# Patient Record
Sex: Female | Born: 1935 | Race: White | Hispanic: No | Smoking: Never smoker
Health system: Southern US, Community
[De-identification: ages and names within clinical notes are randomized; demographics above are authoritative.]

## PROBLEM LIST (undated history)

## (undated) DIAGNOSIS — I2510 Atherosclerotic heart disease of native coronary artery without angina pectoris: Secondary | ICD-10-CM

## (undated) DIAGNOSIS — N289 Disorder of kidney and ureter, unspecified: Secondary | ICD-10-CM

## (undated) DIAGNOSIS — M549 Dorsalgia, unspecified: Secondary | ICD-10-CM

## (undated) DIAGNOSIS — A0100 Typhoid fever, unspecified: Secondary | ICD-10-CM

## (undated) DIAGNOSIS — H3530 Unspecified macular degeneration: Secondary | ICD-10-CM

## (undated) DIAGNOSIS — J449 Chronic obstructive pulmonary disease, unspecified: Secondary | ICD-10-CM

## (undated) DIAGNOSIS — E7800 Pure hypercholesterolemia, unspecified: Secondary | ICD-10-CM

## (undated) DIAGNOSIS — I10 Essential (primary) hypertension: Secondary | ICD-10-CM

## (undated) DIAGNOSIS — M8580 Other specified disorders of bone density and structure, unspecified site: Secondary | ICD-10-CM

## (undated) DIAGNOSIS — J189 Pneumonia, unspecified organism: Secondary | ICD-10-CM

## (undated) DIAGNOSIS — G4730 Sleep apnea, unspecified: Secondary | ICD-10-CM

## (undated) DIAGNOSIS — M1990 Unspecified osteoarthritis, unspecified site: Secondary | ICD-10-CM

## (undated) DIAGNOSIS — F0390 Unspecified dementia without behavioral disturbance: Secondary | ICD-10-CM

## (undated) DIAGNOSIS — K279 Peptic ulcer, site unspecified, unspecified as acute or chronic, without hemorrhage or perforation: Secondary | ICD-10-CM

## (undated) DIAGNOSIS — E079 Disorder of thyroid, unspecified: Secondary | ICD-10-CM

## (undated) HISTORY — PX: BACK SURGERY: SHX140

## (undated) HISTORY — PX: ABDOMINAL HYSTERECTOMY: SHX81

## (undated) HISTORY — PX: CATARACT EXTRACTION: SUR2

## (undated) HISTORY — PX: CHOLECYSTECTOMY: SHX55

## (undated) HISTORY — PX: THYROID SURGERY: SHX805

---

## 1998-03-26 ENCOUNTER — Ambulatory Visit (HOSPITAL_BASED_OUTPATIENT_CLINIC_OR_DEPARTMENT_OTHER): Admission: RE | Admit: 1998-03-26 | Discharge: 1998-03-26 | Payer: Self-pay | Admitting: *Deleted

## 2000-05-03 ENCOUNTER — Encounter: Payer: Self-pay | Admitting: Internal Medicine

## 2000-05-03 ENCOUNTER — Encounter: Admission: RE | Admit: 2000-05-03 | Discharge: 2000-05-03 | Payer: Self-pay | Admitting: Internal Medicine

## 2000-06-08 ENCOUNTER — Encounter: Payer: Self-pay | Admitting: Internal Medicine

## 2000-06-08 ENCOUNTER — Encounter: Admission: RE | Admit: 2000-06-08 | Discharge: 2000-06-08 | Payer: Self-pay | Admitting: Internal Medicine

## 2000-06-20 ENCOUNTER — Ambulatory Visit (HOSPITAL_COMMUNITY): Admission: RE | Admit: 2000-06-20 | Discharge: 2000-06-20 | Payer: Self-pay | Admitting: Gastroenterology

## 2000-07-01 ENCOUNTER — Encounter: Admission: RE | Admit: 2000-07-01 | Discharge: 2000-09-29 | Payer: Self-pay | Admitting: Anesthesiology

## 2001-04-15 ENCOUNTER — Emergency Department (HOSPITAL_COMMUNITY): Admission: EM | Admit: 2001-04-15 | Discharge: 2001-04-15 | Payer: Self-pay | Admitting: Emergency Medicine

## 2001-05-10 ENCOUNTER — Encounter: Payer: Self-pay | Admitting: Internal Medicine

## 2001-05-10 ENCOUNTER — Encounter: Admission: RE | Admit: 2001-05-10 | Discharge: 2001-05-10 | Payer: Self-pay | Admitting: Internal Medicine

## 2002-07-24 ENCOUNTER — Encounter: Payer: Self-pay | Admitting: Internal Medicine

## 2002-07-24 ENCOUNTER — Encounter: Admission: RE | Admit: 2002-07-24 | Discharge: 2002-07-24 | Payer: Self-pay | Admitting: Internal Medicine

## 2004-11-18 ENCOUNTER — Other Ambulatory Visit: Admission: RE | Admit: 2004-11-18 | Discharge: 2004-11-18 | Payer: Self-pay | Admitting: Family Medicine

## 2005-08-03 ENCOUNTER — Encounter: Admission: RE | Admit: 2005-08-03 | Discharge: 2005-08-03 | Payer: Self-pay | Admitting: Family Medicine

## 2005-09-28 ENCOUNTER — Encounter: Admission: RE | Admit: 2005-09-28 | Discharge: 2005-09-28 | Payer: Self-pay | Admitting: Family Medicine

## 2006-05-16 ENCOUNTER — Encounter: Admission: RE | Admit: 2006-05-16 | Discharge: 2006-05-16 | Payer: Self-pay | Admitting: *Deleted

## 2006-08-01 ENCOUNTER — Emergency Department (HOSPITAL_COMMUNITY): Admission: EM | Admit: 2006-08-01 | Discharge: 2006-08-01 | Payer: Self-pay | Admitting: Emergency Medicine

## 2007-02-27 ENCOUNTER — Ambulatory Visit: Payer: Self-pay | Admitting: Vascular Surgery

## 2007-03-03 ENCOUNTER — Encounter: Admission: RE | Admit: 2007-03-03 | Discharge: 2007-03-03 | Payer: Self-pay | Admitting: Family Medicine

## 2007-03-29 ENCOUNTER — Encounter (INDEPENDENT_AMBULATORY_CARE_PROVIDER_SITE_OTHER): Payer: Self-pay | Admitting: Interventional Radiology

## 2007-03-29 ENCOUNTER — Encounter: Admission: RE | Admit: 2007-03-29 | Discharge: 2007-03-29 | Payer: Self-pay | Admitting: Surgery

## 2007-03-29 ENCOUNTER — Other Ambulatory Visit: Admission: RE | Admit: 2007-03-29 | Discharge: 2007-03-29 | Payer: Self-pay | Admitting: Interventional Radiology

## 2007-04-25 ENCOUNTER — Encounter: Admission: RE | Admit: 2007-04-25 | Discharge: 2007-04-25 | Payer: Self-pay | Admitting: Family Medicine

## 2007-05-09 ENCOUNTER — Encounter: Admission: RE | Admit: 2007-05-09 | Discharge: 2007-05-09 | Payer: Self-pay | Admitting: Family Medicine

## 2007-05-23 ENCOUNTER — Ambulatory Visit (HOSPITAL_COMMUNITY): Admission: RE | Admit: 2007-05-23 | Discharge: 2007-05-23 | Payer: Self-pay | Admitting: Surgery

## 2007-06-28 ENCOUNTER — Ambulatory Visit (HOSPITAL_COMMUNITY): Admission: RE | Admit: 2007-06-28 | Discharge: 2007-06-29 | Payer: Self-pay | Admitting: Surgery

## 2007-06-28 ENCOUNTER — Encounter (INDEPENDENT_AMBULATORY_CARE_PROVIDER_SITE_OTHER): Payer: Self-pay | Admitting: Surgery

## 2007-09-13 ENCOUNTER — Encounter: Admission: RE | Admit: 2007-09-13 | Discharge: 2007-09-13 | Payer: Self-pay | Admitting: Family Medicine

## 2007-10-10 ENCOUNTER — Ambulatory Visit: Payer: Self-pay | Admitting: Vascular Surgery

## 2008-04-13 ENCOUNTER — Inpatient Hospital Stay (HOSPITAL_COMMUNITY): Admission: EM | Admit: 2008-04-13 | Discharge: 2008-04-16 | Payer: Self-pay | Admitting: Emergency Medicine

## 2008-08-26 ENCOUNTER — Inpatient Hospital Stay (HOSPITAL_COMMUNITY): Admission: RE | Admit: 2008-08-26 | Discharge: 2008-09-04 | Payer: Self-pay | Admitting: Neurosurgery

## 2008-08-28 ENCOUNTER — Ambulatory Visit: Payer: Self-pay | Admitting: Internal Medicine

## 2008-09-02 ENCOUNTER — Ambulatory Visit: Payer: Self-pay | Admitting: Physical Medicine & Rehabilitation

## 2008-10-07 ENCOUNTER — Observation Stay (HOSPITAL_COMMUNITY): Admission: EM | Admit: 2008-10-07 | Discharge: 2008-10-08 | Payer: Self-pay | Admitting: Emergency Medicine

## 2008-11-28 ENCOUNTER — Ambulatory Visit: Payer: Self-pay | Admitting: Vascular Surgery

## 2009-04-01 ENCOUNTER — Encounter: Admission: RE | Admit: 2009-04-01 | Discharge: 2009-04-01 | Payer: Self-pay | Admitting: Neurosurgery

## 2009-05-15 ENCOUNTER — Inpatient Hospital Stay (HOSPITAL_COMMUNITY)
Admission: EM | Admit: 2009-05-15 | Discharge: 2009-05-17 | Payer: Self-pay | Source: Home / Self Care | Admitting: Emergency Medicine

## 2009-07-28 ENCOUNTER — Encounter: Admission: RE | Admit: 2009-07-28 | Discharge: 2009-07-28 | Payer: Self-pay | Admitting: Family Medicine

## 2009-08-06 ENCOUNTER — Encounter: Admission: RE | Admit: 2009-08-06 | Discharge: 2009-09-15 | Payer: Self-pay | Admitting: Family Medicine

## 2009-09-25 ENCOUNTER — Emergency Department (HOSPITAL_COMMUNITY): Admission: EM | Admit: 2009-09-25 | Discharge: 2009-09-25 | Payer: Self-pay | Admitting: Emergency Medicine

## 2010-01-28 ENCOUNTER — Ambulatory Visit: Payer: Self-pay | Admitting: Vascular Surgery

## 2010-02-27 ENCOUNTER — Ambulatory Visit: Admit: 2010-02-27 | Payer: Self-pay | Admitting: Vascular Surgery

## 2010-02-28 ENCOUNTER — Encounter: Payer: Self-pay | Admitting: Family Medicine

## 2010-03-01 ENCOUNTER — Encounter: Payer: Self-pay | Admitting: Family Medicine

## 2010-03-11 ENCOUNTER — Other Ambulatory Visit: Payer: Self-pay | Admitting: Family Medicine

## 2010-03-13 ENCOUNTER — Other Ambulatory Visit (INDEPENDENT_AMBULATORY_CARE_PROVIDER_SITE_OTHER): Payer: MEDICARE

## 2010-03-13 ENCOUNTER — Other Ambulatory Visit: Payer: Self-pay | Admitting: Family Medicine

## 2010-03-13 DIAGNOSIS — I6529 Occlusion and stenosis of unspecified carotid artery: Secondary | ICD-10-CM

## 2010-03-20 ENCOUNTER — Other Ambulatory Visit: Payer: Self-pay | Admitting: Family Medicine

## 2010-03-20 ENCOUNTER — Ambulatory Visit
Admission: RE | Admit: 2010-03-20 | Discharge: 2010-03-20 | Disposition: A | Discharge status: Home / Self Care | Payer: MEDICARE | Source: Ambulatory Visit | Attending: Family Medicine | Admitting: Family Medicine

## 2010-03-20 NOTE — Procedures (Unsigned)
CAROTID DUPLEX EXAM  INDICATION:  Carotid disease.  HISTORY: Diabetes:  Yes. Cardiac:  No. Hypertension:  Yes. Smoking:  No. Previous Surgery:  No. CV History:  Complaint of occasional dizziness. Amaurosis Fugax No, Paresthesias No, Hemiparesis No                                      RIGHT              LEFT Brachial systolic pressure:         160                168 Brachial Doppler waveforms:         Normal             Normal Vertebral direction of flow:        Antegrade/dampened Antegrade DUPLEX VELOCITIES (cm/sec) CCA peak systolic                   89                 109 ECA peak systolic                   81                 119 ICA peak systolic                   140                102 ICA end diastolic                   21                 18 PLAQUE MORPHOLOGY:                  Mixed              Mixed PLAQUE AMOUNT:                      Mild / moderate    Mild PLAQUE LOCATION:                    ICA / ECA / CCA    ICA / ECA / CCA  IMPRESSION: 1. Doppler velocities suggest high end 1% to 39% stenosis of the right     proximal internal carotid artery. 2. No hemodynamically significant stenosis of the left internal     carotid artery with plaque formations as described above. 3. No significant change in the bilateral internal carotid artery     velocities when compared to previous exam on 03/31/2008.  ___________________________________________ [name]. Edilia Bo, M.D.  CH/MEDQ  D:  03/16/2010  T:  03/16/2010  Job:  [id number]

## 2010-04-29 LAB — URINE CULTURE: Colony Count: 15000

## 2010-04-29 LAB — DIFFERENTIAL
Eosinophils Relative: 2 % (ref 0–5)
Lymphocytes Relative: 29 % (ref 12–46)
Lymphs Abs: 3.1 10*3/uL (ref 0.7–4.0)
Monocytes Absolute: 1.2 10*3/uL — ABNORMAL HIGH (ref 0.1–1.0)
Monocytes Relative: 11 % (ref 3–12)

## 2010-04-29 LAB — COMPREHENSIVE METABOLIC PANEL
AST: 20 U/L (ref 0–37)
Albumin: 3.6 g/dL (ref 3.5–5.2)
Calcium: 8.6 mg/dL (ref 8.4–10.5)
Chloride: 105 mEq/L (ref 96–112)
Creatinine, Ser: 0.99 mg/dL (ref 0.4–1.2)
GFR calc Af Amer: >60 mL/min (ref >60)
Total Bilirubin: 0.4 mg/dL (ref 0.3–1.2)

## 2010-04-29 LAB — CBC
HCT: 36.8 % (ref 36.0–46.0)
Hemoglobin: 11.9 g/dL — ABNORMAL LOW (ref 12.0–15.0)
MCHC: 32.4 g/dL (ref 30.0–36.0)
MCHC: 33.2 g/dL (ref 30.0–36.0)
MCV: 90.4 fL (ref 78.0–100.0)
Platelets: 186 10*3/uL (ref 150–400)
Platelets: 202 10*3/uL (ref 150–400)
RBC: 3.74 MIL/uL — ABNORMAL LOW (ref 3.87–5.11)
RBC: 4.07 MIL/uL (ref 3.87–5.11)
RDW: 15.5 % (ref 11.5–15.5)
WBC: 10.8 10*3/uL — ABNORMAL HIGH (ref 4.0–10.5)

## 2010-04-29 LAB — CARDIAC PANEL(CRET KIN+CKTOT+MB+TROPI)
CK, MB: 2.2 ng/mL (ref 0.3–4.0)
Total CK: 63 U/L (ref 7–177)
Troponin I: 0.01 ng/mL (ref 0.00–0.06)

## 2010-04-29 LAB — URINALYSIS, MICROSCOPIC ONLY
Bilirubin Urine: NEGATIVE
Glucose, UA: NEGATIVE mg/dL
Protein, ur: NEGATIVE mg/dL
Specific Gravity, Urine: 1.007 (ref 1.005–1.030)
Urobilinogen, UA: 0.2 mg/dL (ref 0.0–1.0)

## 2010-04-29 LAB — GLUCOSE, CAPILLARY
Glucose-Capillary: 125 mg/dL — ABNORMAL HIGH (ref 70–99)
Glucose-Capillary: 130 mg/dL — ABNORMAL HIGH (ref 70–99)
Glucose-Capillary: 149 mg/dL — ABNORMAL HIGH (ref 70–99)

## 2010-04-29 LAB — BASIC METABOLIC PANEL
CO2: 29 mEq/L (ref 19–32)
Calcium: 8.9 mg/dL (ref 8.4–10.5)
Creatinine, Ser: 0.8 mg/dL (ref 0.4–1.2)
GFR calc Af Amer: >60 mL/min (ref >60)
Glucose, Bld: 105 mg/dL — ABNORMAL HIGH (ref 70–99)

## 2010-04-29 LAB — CK TOTAL AND CKMB (NOT AT ARMC)
CK, MB: 2.4 ng/mL (ref 0.3–4.0)
Relative Index: INVALID (ref 0.0–2.5)
Total CK: 57 U/L (ref 7–177)

## 2010-05-16 LAB — CBC
HCT: 33.6 % — ABNORMAL LOW (ref 36.0–46.0)
Hemoglobin: 11.1 g/dL — ABNORMAL LOW (ref 12.0–15.0)
MCHC: 33.1 g/dL (ref 30.0–36.0)
MCHC: 33.7 g/dL (ref 30.0–36.0)
MCV: 87.8 fL (ref 78.0–100.0)
Platelets: 166 10*3/uL (ref 150–400)
Platelets: 167 10*3/uL (ref 150–400)
RBC: 3.72 MIL/uL — ABNORMAL LOW (ref 3.87–5.11)
RDW: 15.1 % (ref 11.5–15.5)

## 2010-05-16 LAB — BASIC METABOLIC PANEL
BUN: 13 mg/dL (ref 6–23)
BUN: 20 mg/dL (ref 6–23)
CO2: 22 mEq/L (ref 19–32)
Calcium: 9.1 mg/dL (ref 8.4–10.5)
Chloride: 103 mEq/L (ref 96–112)
Creatinine, Ser: 0.94 mg/dL (ref 0.4–1.2)
GFR calc non Af Amer: >60 mL/min (ref >60)
Glucose, Bld: 128 mg/dL — ABNORMAL HIGH (ref 70–99)
Potassium: 3.5 mEq/L (ref 3.5–5.1)
Sodium: 135 mEq/L (ref 135–145)

## 2010-05-16 LAB — INFLUENZA A+B VIRUS AG-DIRECT(RAPID)

## 2010-05-16 LAB — GLUCOSE, CAPILLARY
Glucose-Capillary: 137 mg/dL — ABNORMAL HIGH (ref 70–99)
Glucose-Capillary: 87 mg/dL (ref 70–99)

## 2010-05-16 LAB — DIFFERENTIAL
Basophils Relative: 0 % (ref 0–1)
Eosinophils Absolute: 0 10*3/uL (ref 0.0–0.7)
Monocytes Relative: 7 % (ref 3–12)
Neutrophils Relative %: 67 % (ref 43–77)

## 2010-05-16 LAB — CARDIAC PANEL(CRET KIN+CKTOT+MB+TROPI)
CK, MB: UNDETERMINED ng/mL (ref 0.3–4.0)
Relative Index: INVALID (ref 0.0–2.5)
Relative Index: INVALID (ref 0.0–2.5)
Total CK: 37 U/L (ref 7–177)
Total CK: 59 U/L (ref 7–177)
Troponin I: 0.03 ng/mL (ref 0.00–0.06)
Troponin I: 0.05 ng/mL (ref 0.00–0.06)

## 2010-05-16 LAB — CK TOTAL AND CKMB (NOT AT ARMC)
CK, MB: 1.5 ng/mL (ref 0.3–4.0)
Relative Index: INVALID (ref 0.0–2.5)
Total CK: 41 U/L (ref 7–177)

## 2010-05-16 LAB — HEMOGLOBIN A1C: Hgb A1c MFr Bld: 6.7 % — ABNORMAL HIGH (ref 4.6–6.1)

## 2010-05-16 LAB — POCT CARDIAC MARKERS
Myoglobin, poc: 86.1 ng/mL (ref 12–200)
Myoglobin, poc: 87.8 ng/mL (ref 12–200)
Myoglobin, poc: 95.3 ng/mL (ref 12–200)
Troponin i, poc: 0.07 ng/mL (ref 0.00–0.09)

## 2010-05-16 LAB — CULTURE, BLOOD (ROUTINE X 2): Culture: NO GROWTH

## 2010-05-16 LAB — BRAIN NATRIURETIC PEPTIDE: Pro B Natriuretic peptide (BNP): 150 pg/mL — ABNORMAL HIGH (ref 0.0–100.0)

## 2010-05-17 LAB — GLUCOSE, CAPILLARY
Glucose-Capillary: 100 mg/dL — ABNORMAL HIGH (ref 70–99)
Glucose-Capillary: 110 mg/dL — ABNORMAL HIGH (ref 70–99)
Glucose-Capillary: 113 mg/dL — ABNORMAL HIGH (ref 70–99)
Glucose-Capillary: 114 mg/dL — ABNORMAL HIGH (ref 70–99)
Glucose-Capillary: 116 mg/dL — ABNORMAL HIGH (ref 70–99)
Glucose-Capillary: 117 mg/dL — ABNORMAL HIGH (ref 70–99)
Glucose-Capillary: 120 mg/dL — ABNORMAL HIGH (ref 70–99)
Glucose-Capillary: 123 mg/dL — ABNORMAL HIGH (ref 70–99)
Glucose-Capillary: 129 mg/dL — ABNORMAL HIGH (ref 70–99)
Glucose-Capillary: 132 mg/dL — ABNORMAL HIGH (ref 70–99)
Glucose-Capillary: 134 mg/dL — ABNORMAL HIGH (ref 70–99)
Glucose-Capillary: 135 mg/dL — ABNORMAL HIGH (ref 70–99)
Glucose-Capillary: 138 mg/dL — ABNORMAL HIGH (ref 70–99)
Glucose-Capillary: 146 mg/dL — ABNORMAL HIGH (ref 70–99)
Glucose-Capillary: 146 mg/dL — ABNORMAL HIGH (ref 70–99)
Glucose-Capillary: 148 mg/dL — ABNORMAL HIGH (ref 70–99)
Glucose-Capillary: 150 mg/dL — ABNORMAL HIGH (ref 70–99)
Glucose-Capillary: 157 mg/dL — ABNORMAL HIGH (ref 70–99)
Glucose-Capillary: 173 mg/dL — ABNORMAL HIGH (ref 70–99)
Glucose-Capillary: 199 mg/dL — ABNORMAL HIGH (ref 70–99)
Glucose-Capillary: 208 mg/dL — ABNORMAL HIGH (ref 70–99)
Glucose-Capillary: 37 mg/dL — CL (ref 70–99)
Glucose-Capillary: 39 mg/dL — CL (ref 70–99)
Glucose-Capillary: 46 mg/dL — ABNORMAL LOW (ref 70–99)

## 2010-05-17 LAB — CBC
HCT: 23.2 % — ABNORMAL LOW (ref 36.0–46.0)
HCT: 25.7 % — ABNORMAL LOW (ref 36.0–46.0)
HCT: 31.5 % — ABNORMAL LOW (ref 36.0–46.0)
HCT: 36.1 % (ref 36.0–46.0)
Hemoglobin: 10.5 g/dL — ABNORMAL LOW (ref 12.0–15.0)
Hemoglobin: 8.8 g/dL — ABNORMAL LOW (ref 12.0–15.0)
Hemoglobin: 9.1 g/dL — ABNORMAL LOW (ref 12.0–15.0)
Hemoglobin: 12 g/dL (ref 12.0–15.0)
MCHC: 33.2 g/dL (ref 30.0–36.0)
MCV: 91.9 fL (ref 78.0–100.0)
MCV: 92.1 fL (ref 78.0–100.0)
MCV: 92.4 fL (ref 78.0–100.0)
Platelets: 164 10*3/uL (ref 150–400)
Platelets: 262 10*3/uL (ref 150–400)
RBC: 2.52 MIL/uL — ABNORMAL LOW (ref 3.87–5.11)
RBC: 2.54 MIL/uL — ABNORMAL LOW (ref 3.87–5.11)
RBC: 2.8 MIL/uL — ABNORMAL LOW (ref 3.87–5.11)
RBC: 2.97 MIL/uL — ABNORMAL LOW (ref 3.87–5.11)
RDW: 16.7 % — ABNORMAL HIGH (ref 11.5–15.5)
RDW: 16.2 % — ABNORMAL HIGH (ref 11.5–15.5)
WBC: 10.8 10*3/uL — ABNORMAL HIGH (ref 4.0–10.5)
WBC: 14.5 10*3/uL — ABNORMAL HIGH (ref 4.0–10.5)
WBC: 16 10*3/uL — ABNORMAL HIGH (ref 4.0–10.5)
WBC: 8.7 10*3/uL (ref 4.0–10.5)

## 2010-05-17 LAB — BASIC METABOLIC PANEL
BUN: 33 mg/dL — ABNORMAL HIGH (ref 6–23)
CO2: 26 mEq/L (ref 19–32)
Calcium: 7.3 mg/dL — ABNORMAL LOW (ref 8.4–10.5)
Calcium: 8.1 mg/dL — ABNORMAL LOW (ref 8.4–10.5)
Chloride: 103 mEq/L (ref 96–112)
Chloride: 106 mEq/L (ref 96–112)
Chloride: 101 mEq/L (ref 96–112)
Creatinine, Ser: 0.87 mg/dL (ref 0.4–1.2)
Creatinine, Ser: 1.08 mg/dL (ref 0.4–1.2)
Creatinine, Ser: 1.1 mg/dL (ref 0.4–1.2)
GFR calc Af Amer: 57 mL/min — ABNORMAL LOW (ref >60)
GFR calc Af Amer: 59 mL/min — ABNORMAL LOW (ref >60)
GFR calc Af Amer: >60 mL/min (ref >60)
GFR calc non Af Amer: 48 mL/min — ABNORMAL LOW (ref >60)
GFR calc non Af Amer: 59 mL/min — ABNORMAL LOW (ref >60)
Glucose, Bld: 175 mg/dL — ABNORMAL HIGH (ref 70–99)
Glucose, Bld: 89 mg/dL (ref 70–99)
Potassium: 3.8 mEq/L (ref 3.5–5.1)
Potassium: 4 mEq/L (ref 3.5–5.1)
Potassium: 4.4 mEq/L (ref 3.5–5.1)
Sodium: 132 mEq/L — ABNORMAL LOW (ref 135–145)
Sodium: 132 mEq/L — ABNORMAL LOW (ref 135–145)
Sodium: 138 mEq/L (ref 135–145)
Sodium: 138 mEq/L (ref 135–145)
Sodium: 138 mEq/L (ref 135–145)

## 2010-05-17 LAB — PHOSPHORUS: Phosphorus: 2.7 mg/dL (ref 2.3–4.6)

## 2010-05-17 LAB — URINALYSIS, ROUTINE W REFLEX MICROSCOPIC
Bilirubin Urine: NEGATIVE
Ketones, ur: NEGATIVE mg/dL
Nitrite: NEGATIVE
Specific Gravity, Urine: 1.018 (ref 1.005–1.030)
Urobilinogen, UA: 0.2 mg/dL (ref 0.0–1.0)
pH: 5.5 (ref 5.0–8.0)

## 2010-05-17 LAB — CROSSMATCH

## 2010-05-17 LAB — BRAIN NATRIURETIC PEPTIDE: Pro B Natriuretic peptide (BNP): 388 pg/mL — ABNORMAL HIGH (ref 0.0–100.0)

## 2010-05-17 LAB — MAGNESIUM: Magnesium: 1.7 mg/dL (ref 1.5–2.5)

## 2010-05-17 LAB — CULTURE, BLOOD (ROUTINE X 2): Culture: NO GROWTH

## 2010-05-17 LAB — ABO/RH: ABO/RH(D): A POS

## 2010-05-21 LAB — DIFFERENTIAL
Basophils Absolute: 0.1 10*3/uL (ref 0.0–0.1)
Basophils Relative: 1 % (ref 0–1)
Eosinophils Absolute: 0.3 10*3/uL (ref 0.0–0.7)
Eosinophils Relative: 0 % (ref 0–5)
Eosinophils Relative: 3 % (ref 0–5)
Lymphocytes Relative: 11 % — ABNORMAL LOW (ref 12–46)
Lymphs Abs: 2.9 10*3/uL (ref 0.7–4.0)
Monocytes Absolute: 0.3 10*3/uL (ref 0.1–1.0)
Monocytes Absolute: 0.4 10*3/uL (ref 0.1–1.0)
Monocytes Relative: 3 % (ref 3–12)
Monocytes Relative: 4 % (ref 3–12)
Neutrophils Relative %: 62 % (ref 43–77)

## 2010-05-21 LAB — URINALYSIS, ROUTINE W REFLEX MICROSCOPIC
Bilirubin Urine: NEGATIVE
Ketones, ur: NEGATIVE mg/dL
Leukocytes, UA: NEGATIVE
Nitrite: NEGATIVE
Protein, ur: 30 mg/dL — AB
pH: 6.5 (ref 5.0–8.0)

## 2010-05-21 LAB — BASIC METABOLIC PANEL
BUN: 23 mg/dL (ref 6–23)
BUN: 29 mg/dL — ABNORMAL HIGH (ref 6–23)
CO2: 28 mEq/L (ref 19–32)
Calcium: 8.6 mg/dL (ref 8.4–10.5)
Chloride: 105 mEq/L (ref 96–112)
Creatinine, Ser: 1.13 mg/dL (ref 0.4–1.2)
GFR calc non Af Amer: 41 mL/min — ABNORMAL LOW (ref >60)
Glucose, Bld: 115 mg/dL — ABNORMAL HIGH (ref 70–99)
Glucose, Bld: 179 mg/dL — ABNORMAL HIGH (ref 70–99)

## 2010-05-21 LAB — COMPREHENSIVE METABOLIC PANEL
ALT: 15 U/L (ref 0–35)
AST: 26 U/L (ref 0–37)
Albumin: 3.8 g/dL (ref 3.5–5.2)
Alkaline Phosphatase: 67 U/L (ref 39–117)
Calcium: 9.3 mg/dL (ref 8.4–10.5)
GFR calc Af Amer: >60 mL/min (ref >60)
Glucose, Bld: 174 mg/dL — ABNORMAL HIGH (ref 70–99)
Potassium: 4.2 mEq/L (ref 3.5–5.1)
Sodium: 137 mEq/L (ref 135–145)
Total Protein: 7.4 g/dL (ref 6.0–8.3)

## 2010-05-21 LAB — POCT CARDIAC MARKERS
CKMB, poc: 2.6 ng/mL (ref 1.0–8.0)
Myoglobin, poc: 109 ng/mL (ref 12–200)
Troponin i, poc: <0.05 ng/mL (ref 0.00–0.09)

## 2010-05-21 LAB — GLUCOSE, CAPILLARY
Glucose-Capillary: 121 mg/dL — ABNORMAL HIGH (ref 70–99)
Glucose-Capillary: 130 mg/dL — ABNORMAL HIGH (ref 70–99)
Glucose-Capillary: 135 mg/dL — ABNORMAL HIGH (ref 70–99)
Glucose-Capillary: 179 mg/dL — ABNORMAL HIGH (ref 70–99)
Glucose-Capillary: 197 mg/dL — ABNORMAL HIGH (ref 70–99)
Glucose-Capillary: 206 mg/dL — ABNORMAL HIGH (ref 70–99)

## 2010-05-21 LAB — CBC
HCT: 31 % — ABNORMAL LOW (ref 36.0–46.0)
Hemoglobin: 10.1 g/dL — ABNORMAL LOW (ref 12.0–15.0)
Hemoglobin: 10.2 g/dL — ABNORMAL LOW (ref 12.0–15.0)
Hemoglobin: 11.3 g/dL — ABNORMAL LOW (ref 12.0–15.0)
MCHC: 32.5 g/dL (ref 30.0–36.0)
MCHC: 32.9 g/dL (ref 30.0–36.0)
MCHC: 33.1 g/dL (ref 30.0–36.0)
MCHC: 33.3 g/dL (ref 30.0–36.0)
MCV: 81.2 fL (ref 78.0–100.0)
MCV: 82.4 fL (ref 78.0–100.0)
MCV: 82.6 fL (ref 78.0–100.0)
Platelets: 207 10*3/uL (ref 150–400)
RBC: 3.71 MIL/uL — ABNORMAL LOW (ref 3.87–5.11)
RBC: 4.23 MIL/uL (ref 3.87–5.11)
RDW: 19.8 % — ABNORMAL HIGH (ref 11.5–15.5)
RDW: 20.1 % — ABNORMAL HIGH (ref 11.5–15.5)
WBC: 12.4 10*3/uL — ABNORMAL HIGH (ref 4.0–10.5)
WBC: 8.9 10*3/uL (ref 4.0–10.5)
WBC: 9.6 10*3/uL (ref 4.0–10.5)

## 2010-05-21 LAB — CARDIAC PANEL(CRET KIN+CKTOT+MB+TROPI): CK, MB: 2.8 ng/mL (ref 0.3–4.0)

## 2010-05-21 LAB — CK TOTAL AND CKMB (NOT AT ARMC)
CK, MB: 4.2 ng/mL — ABNORMAL HIGH (ref 0.3–4.0)
Total CK: 113 U/L (ref 7–177)

## 2010-05-21 LAB — RETICULOCYTES: Retic Ct Pct: 2.3 % (ref 0.4–3.1)

## 2010-05-21 LAB — TROPONIN I: Troponin I: 0.02 ng/mL (ref 0.00–0.06)

## 2010-05-21 LAB — URINE MICROSCOPIC-ADD ON

## 2010-06-23 NOTE — H&P (Signed)
NAME:  Lori Long, Lori Long NO.:  [id number]   MEDICAL RECORD NO.:  [id number]          PATIENT TYPE:  EMS   LOCATION:  ED                           FACILITY:  [facility]   PHYSICIAN:  Hollice Espy, M.D.DATE OF BIRTH:  [birth year]   DATE OF ADMISSION:  10/07/2008  DATE OF DISCHARGE:                              HISTORY & PHYSICAL   PRIMARY CARE PHYSICIAN:  Dr. Shaune Pollack.   CHIEF COMPLAINT:  Shortness of breath.   HISTORY OF PRESENT ILLNESS:  Ms. Melfi is an 77 year old female patient  most recently status post lumbar decompression by Dr. Lovell Sheehan late July  2010, currently on Keflex postoperatively.  She presented to the ER  today with 24-hour complaint of shortness of breath.  Initial ER workup  was unrevealing.  Chest x-ray was negative.  White count was normal.  Neutrophils were normal.  She did have a slight bump in her second  troponin I, but EKG was normal, as well, without any evidence of  ischemia.  In discussing with the patient, her symptoms began abruptly  yesterday after church initially with nausea, then loose stools.  She  just felt bad throughout the night, had developed anorexia.  No emesis.  She began noticing shortness of breath just before going to bed.  She  had difficulty sleeping last night.  She actually went back and forth  from the bed to the recliner, but just could not get comfortable.  She  basically felt like her breath was  just going to cut out, in her words.  This morning she called her  daughter to check on her.  In the process of attempting to figure out  what was wrong with her mother, the daughter obtained a stethoscope from  the home, asked the patient to take a deep breath.  At this point, the  patient had a very strong productive cough, and she reports yellow  sputum.  We have been asked to evaluate the patient for admission for  shortness of breath.   REVIEW OF SYSTEMS:  CONSTITUTIONAL:  She has had fever and chills and  anorexia for about 24 hours.  PSYCH:  No new social issues, denies the  anxiety or depression.  No hallucinations.  No voices.  EYES:  No  drainage from the eyes, no visual disturbances.  No eye pain.  EARS,  NOSE AND THROAT:  She has had sinus pressure bilaterally of the  maxillary and frontal sinuses.  She reports drainage mainly to the back  of the throat.  She has not had to blow her nose for this.  She denies  ear pain.  She denies any type of sore throat.  NEURO:  No unifocal loss  of vision or use of extremity.  No numbness or tingling or weakness.  No  dizziness.  No falls.  CHEST:  As per the history of present illness.  Again, she has no wheezing.  She notices that her shortness of breath is  worse when she is supine consistent with orthopnea.  She has noticed the  coughing with deep breathing.  CARDIOVASCULAR: She has  not had any chest  pain, tachy palpitations, peripheral edema or dyspnea on exertion.  ABDOMEN: She denies abdominal pain, nausea and loose stools as described  above in the history of present illness.  GENITOURINARY: She has been  reporting burning, painful urination with odor for about 2 weeks.  Denies vaginal discharge.  MUSCULOSKELETAL:  No injuries, no insect  bites, etc.  The patient does not have any back pain or operative site pain.  SKIN:  Hot and flushed, no unusual lesions, bruises.  Otherwise, review of systems is negative or noncontributory.   ALLERGIES:  CODEINE and MORPHINE.   HOME MEDICATIONS:  1. Glipizide 10 mg daily.  2. Neurontin 300 mg t.i.d.  3. Keflex 500 mg every 6 hours postop.  She has nearly completed this.  4. Synthroid 75 mcg daily.  5. Metoprolol 25 mg b.i.d.  6. Avandia 4 mg daily.  7. Pravastatin 40 mg h.s.  8. Diovan/hydrochlorothiazide 320/12.5 daily.  9. Vicodin 10/500 one every 6 hours as needed for discomfort.   SOCIAL HISTORY:  Denies alcohol or tobacco.  Daughter accompanies the  patient to the ER.   FAMILY HISTORY:   Noncontributory to this exam.   PAST MEDICAL HISTORY:  1. 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  2. Spinal stenosis.  3. Diabetes mellitus with neuropathy.  4. Postsurgical hypothyroidism.  5. Hypertension.   PAST SURGICAL HISTORY:  1. Lumbar decompression July 2010 by Dr. Lovell Sheehan.  2. Right thyroid lobectomy May 2009 by Dr. Gerrit Friends.  3. Remote cholecystectomy.  4. Remote C-section.  5. Remote hysterectomy and appendectomy.   PHYSICAL EXAMINATION:  GENERAL:  Pleasant, but mildly toxic-appearing  female patient reporting shortness of breath and dysuria.  VITAL SIGNS: Temperature 98.2, BP 126/44, pulse 82 and regular,  respirations 20.  PSYCH:  The patient is alert and oriented x3.  Affect appropriate for  current situation.  NEURO:  Cranial nerves II-XII are grossly intact.  She is moving all  extremities x4 without focal neurological deficits.  DTRs and gait were  not assessed.  EYES:  Sclerae are mildly injected, but nonicteric bilaterally.  No  conjunctivitis, no ectropion.  EARS, NOSE AND THROAT:  Ears are symmetrical.  No otorrhea.  There is  bilateral cerumen present obscuring the tympanic membranes.  Nose is  midline without rhinorrhea.  Nares are pale and boggy.  Throat:  Oral  mucous membranes are pink, but dry.  There is no redness in the  posterior pharynx when observed.  CHEST:  Bilateral lung sounds are clear to auscultation anteriorly.  Posteriorly, she has right basilar crackles.  No wheezing,  nontachypneic, nonlabored.  She is on O2 here at the hospital satting  97%.  CARDIOVASCULAR:  Heart sounds are S1, S2s without rubs, murmurs, thrills  or gallops.  No JVD.  No peripheral edema.  Pulses regular and  nontachycardiac.  ABDOMEN:  Soft, nondistended.  Bowel sounds are present.  She is mildly  tender in the umbilical region without guarding or rebounding.  No  obvious hepatosplenomegaly, masses or bruits.  GENITOURINARY:  External exam was deferred.  She is  not experiencing any  suprapubic pain with palpation.  MUSCULOSKELETAL:  Extremities are  symmetrical in appearance without cyanosis or clubbing.   LABORATORY DATA:  Sodium 134, potassium 3.5, CO2 of 22, glucose 168, BUN  20, creatinine 0.94.  White count 8300, neutrophils 67%, hemoglobin 11,  platelets 167,000.  BNP is pending.  Point-of-care enzymes are  essentially negative x2 collections.  Troponin I has  increased from 0.07  to 0.12.   DIAGNOSTICS:  Chest x-ray is consistent with COPD, without evidence of  pneumonia or heart failure.  EKG shows sinus rhythm without any ischemic  ST-segment changes.  QTC is 425 milliseconds.   IMPRESSION:  1. Shortness of breath and cough.  Differential includes early      community-acquired pneumonia/influenza versus ischemic equivalent.  2. Known chronic obstructive pulmonary disease secondary to chronic      bronchitis.  No evidence of chronic obstructive pulmonary disease      exacerbation.  3. Dysuria, possible urinary tract infection.  4. Diabetes with neuropathy.  5. Hypertension, controlled.  6. Recent lumbar decompression surgery without any symptomatology      related to the back.   PLAN:  1. [facility].  Inpatient      status telemetry monitoring.  2. Cycle cardiac enzymes.  Follow up on the BNP.  This is to ensure      that the patient's pulmonary symptoms are not from occult heart      failure.  3. Currently, we will begin treatment as if this is a pneumonia      sinusitis issue and possible UTI.  Will start Rocephin and      Zithromax IV after cultures have been obtained.  4. Check baseline blood cultures x2 and urinalysis and culture.  Also,      obtain nasal swab for influenza A and B.  5. Continue home medications, except no hydrochlorothiazide with the      Diovan.  Check glucometer a.c. and h.s. and provide sliding scale      insulin coverage as ordered.  6. Clear liquids until  anorexia resolved.  Otherwise, treat symptoms      with p.r.n. medication.  7. Have the nurses perform daily dressing changes to the postoperative      back wound as prior to admission.  8. DVT prophylaxis with Lovenox and GI prophylaxis with Protonix.      Allison L. Rennis Harding, N.P.      Hollice Espy, M.D.  Electronically Signed    ALE/MEDQ  D:  10/07/2008  T:  10/07/2008  Job:  [id number]   cc:   Duncan Dull, M.D.  Fax: [phone]

## 2010-06-23 NOTE — Consult Note (Signed)
NEW PATIENT CONSULTATION   Lori Long, Lori Long  DOB:  29-Nov-1923                                       10/10/2007  EAVWU#:[id number]   I saw the patient in the office today in consultation concerning some  carotid disease.  She was referred by Dr. Kevan Ny.  This is a pleasant 77-  year-old woman who approximately 6 months ago began having occasional  episodes of dizziness that would occur suddenly.  She has also had some  occasional problems with unsteady gait.  She also states that 2 weeks  ago she passed out.  She denies any previous history of stroke, TIAs,  expressive or receptive aphasia or amaurosis fugax.  Dr. Kevan Ny placed  her on Antivert and this has helped.  As part of her workup she  underwent an MRA and this did show a focal area of stenosis in the  distal cavernous segment of her left internal carotid artery.  There was  also a moderate stenosis in the left vertebrobasilar junction.  Her MRI  showed no evidence of acute ischemia and there was some mild diffuse  atrophy.   PAST MEDICAL HISTORY:  Her past medical history is significant for adult  onset diabetes.  She does not require insulin.  In addition, she has  hypertension and hypercholesterolemia.  She denies any previous history  of myocardial infarction, history of congestive heart failure or history  of COPD.   FAMILY HISTORY:  There is no history of premature cardiovascular disease  that she is aware of.   SOCIAL HISTORY:  She is widowed.  She has three children.  She does not  use tobacco.   MEDICATIONS AND REVIEW OF SYSTEMS:  Are documented on the medical  history form in her chart.   PHYSICAL EXAMINATION:  General:  This is a pleasant 77 year old woman  who appears her stated age.  Vital signs:  Blood pressure 162/78 on the  left and 167/75 on the right, heart rate is 68.  Neck:  Neck is supple.  There is no cervical lymphadenopathy.  She has a soft right carotid  bruit.  Lungs:  Are  clear bilaterally to auscultation.  Cardiac:  She  has a regular rate and rhythm.  Abdomen:  Soft and nontender.  She has  normal pitched bowel sounds.  I cannot palpate an aneurysm.  She has  palpable femoral pulses and warm, well-perfused feet without ischemic  ulcers.  She has no significant lower extremity swelling.  She has good  strength in her upper extremities and lower extremities bilaterally.   Carotid duplex scan in our office today shows bilateral 40-59% carotid  stenoses.  Both vertebral arteries are patent with normally directed  flow.   I have reviewed her MRA.  I do not see any evidence of significant  vertebrobasilar insufficiency on MRA that would explain her symptoms.  She does have some intracranial disease on the left side, however, I do  not think this would explain her symptoms.  Likewise, she has no  significant extracranial carotid disease.  She has bilateral 40-59%  stenoses.  I have explained we generally do not consider carotid  endarterectomy unless the stenosis progressed to greater than 80% or she  had clear-cut hemispheric symptoms.   In summary, I do not think her current symptoms are related to her  intracranial  disease and she has no significant extracranial disease by  duplex.  I have recommended followup duplex scan in 1 year.  Fortunately  her symptoms have seemed to improve somewhat since she has been on  Antivert.  She has not had any hypoglycemic episodes or palpitations  that she is aware of.  If her symptoms persist perhaps consideration  could be given to Holter monitor.   I will plan on seeing her back in 1 year for followup duplex scan.  She  knows to call sooner if she has problems.  In the meantime she knows to  continue taking her aspirin.   [name]. Edilia Bo, M.D.  Electronically Signed   CSD/MEDQ  D:  10/10/2007  T:  10/11/2007  Job:  [id number]   cc:   Duncan Dull, M.D.

## 2010-06-23 NOTE — Op Note (Signed)
NAME:  Lori Long, Lori Long NO.:  [id number]   MEDICAL RECORD NO.:  [id number]          PATIENT TYPE:  OIB   LOCATION:  5152                         FACILITY:  [facility]   PHYSICIAN:  Velora Heckler, MD      DATE OF BIRTH:  09-Mar-1923   DATE OF PROCEDURE:  06/28/2007  DATE OF DISCHARGE:                               OPERATIVE REPORT   PREOPERATIVE DIAGNOSIS:  Right thyroid nodule.   POSTOPERATIVE DIAGNOSIS:  Right thyroid nodule.   PROCEDURE:  Right thyroid lobectomy.   SURGEON:  Velora Heckler, MD, FACS   ANESTHESIA:  General as per Dr. Kipp Brood.   PERFORATION:  Betadine.   COMPLICATIONS:  None.   BLOOD LOSS:  Minimal.   INDICATIONS:  The patient is an 77 year old white female from  [place], [place].  She was found on routine examination by Dr.  Shaune Pollack to have a right-sided thyroid nodule.  Ultrasound  demonstrated a dominant nodule in the right lobe measuring 3.2 cm in  size.  Fine needle aspiration was obtained.  This showed some nuclear  changes and was worrisome for a follicular neoplasm.  The patient now  comes to the surgery for resection for definitive diagnosis.   BODY OF REPORT:  Procedure is done at OR #10 at the [facility]. [facility].  The patient was brought to the operating room,  placed in the supine position on the operating room table.  Following  administration of general anesthesia, the patient is positioned and then  prepped and draped in the usual strict aseptic fashion.  After  ascertaining that an adequate level of anesthesia have been achieved, a  Kocher incision was made with #15 blade.  Dissection was carried down  through subcutaneous tissues.  Platysma was divided with the  electrocautery.  Subplatysmal flaps were developed cephalad and caudad  from the thyroid notch to the sternal notch.  A Mahorner self-retaining  retractor is placed for exposure.  Strap muscles were elevated initially  on the left  side.  The left thyroid lobe is small, firm, and consistent  with underlying thyroiditis.  No significant nodules were noted on the  left.   Next, we exposed the right thyroid lobe by resecting strap muscles  laterally.  Middle thyroid vein was divided between medium Ligaclips  with a Harmonic Scalpel.  Gland is gently dissected out.  There is a  dominant nodule measuring approximately 3 cm in size in the superior  pole.  Superior pole was dissected out.  They are divided between medium  Ligaclips with a Harmonic scalpel.  Gland is rolled anteriorly.  Inferior venous tributaries are ligated with 2-0 silk ties and divided  with the Harmonic scalpel.  Branches of the inferior thyroid artery are  divided between small Ligaclips with the Harmonic Scalpel.  Parathyroid  tissues were identified and preserved.  Gland is rolled further  anteriorly and the ligament of Allyson Sabal was transected with the  electrocautery.  Gland is mobilized up and on to the anterior trachea.  This mesh is mobilized across the midline.  This mesh was  transected  with the Harmonic scalpel at its junction with the left thyroid lobe.  Specimen was removed and was submitted to pathology.  Dr. Charlott Rakes  examined this specimen and confirmed that this is indeed a follicular  lesion.  Hurthle cell changes present.  There are no overt signs of  malignancy.   Neck is irrigated with warm saline.  Good hemostasis was noted.  Surgicel was placed in the operative field.  Strap muscles were  reapproximated in the midline with interrupted 3-0 Vicryl sutures.  Platysma was closed with interrupted 3-0 Vicryl sutures.  Skin was  closed with a running 4-0 Monocryl subcuticular suture.  Wound was  washed and dried.  Benzoin and Steri-Strips were applied.  Sterile  dressings were applied.  The patient was awakened from anesthesia and  brought to the recovery room in stable condition.  The patient tolerated  the procedure well.       Velora Heckler, MD  Electronically Signed     TMG/MEDQ  D:  06/28/2007  T:  06/29/2007  Job:  [id number]   cc:   Duncan Dull, M.D.

## 2010-06-23 NOTE — H&P (Signed)
NAME:  Lori Long, SORCI NO.:  [id number]   MEDICAL RECORD NO.:  [id number]          PATIENT TYPE:  EMS   LOCATION:  MAJO                         FACILITY:  [facility]   PHYSICIAN:  Hollice Espy, M.D.DATE OF BIRTH:  12/27/1923   DATE OF ADMISSION:  04/13/2008  DATE OF DISCHARGE:                              HISTORY & PHYSICAL   PRIMARY CARE PHYSICIAN:  Dr. Shaune Pollack   CHIEF COMPLAINT:  Shortness of breath.   HISTORY OF PRESENT ILLNESS:  The patient is an 84-year white female with  past medical history of hypertension, diabetes, and possible chronic  bronchitis, who started complaining of some shortness of breath about a  week ago.  She was evaluated and felt to have a possible acute  bronchitis attack.  She was scheduled for a chest x-ray.  However, over  the last day, she has had worsening shortness of breath, significant  wheezing, some significant cough that was nonproductive, and she came  into the Emergency Room.  When she was evaluated today, she had an  episode of nausea and vomiting.  When she was evaluated, she was noted  to have heart rate of 90, blood pressure 137/84, and had bilateral  wheezing throughout.  She given oxygen, Solu-Medrol, medication for  nausea and for pain, and an aspirin because of concern about perhaps she  was having atypical cardiac event.  A chest x-ray demonstrated  cardiomegaly, pulmonary vascular congestion without edema, and COPD.  She was also noted to have some minimal basilar atelectasis.  A BNP was  done which was normal at 94.  Her white count was normal at 9.6 with no  shift.  Cardiac markers were unremarkable.  Her urinalysis was  unremarkable as well.  The patient started feeling better after  receiving medicine for pain, breathing treatment, and IV steroids.  Currently, she says she feels still very weak.  She denies any  headaches, vision changes, or dysphagia.  No chest pain or palpitations.  She said her  breathing is improved, although still not yet baseline.  She does have some shortness of breath and some mild wheezing.  She has  a nonproductive cough.  She denies any abdominal pain.  No hematuria or  dysuria.  Her bowels have been moving regularly.  No constipation or  diarrhea.  No focal history of numbness, weakness, or pain.  Review of  systems is otherwise negative.  Her past medical history includes  hypertension, diabetes mellitus, and sleep disorder, and (according to  family) chronic bronchitis.   MEDICATIONS:  1. Percocet 5/225 p.o. q.6 h. p.r.n.  2. Os-Cal daily.  3. Combigan eye drops.  4. Neurontin 300 unclear if that is t.i.d. or daily.  5. Glipizide 10.  6. Meclizine 25 p.r.n.  7. Metoprolol 50 b.i.d.  8. Omega 3 fish oil.  9. Restoril 15 q.h.s.  10.Vitamin B12 daily.  11.Ocuvite PreserVision daily.   ALLERGIES:  MORPHINE, CODEINE.   SOCIAL HISTORY:  She denies any tobacco, alcohol, or drug use.   FAMILY HISTORY:  Noncontributory.   PHYSICAL EXAM:  VITALS ON ADMISSION:  Temperature 98.2,  heart rate 90,  blood pressure 177/84 now down to 143/59, respirations 18, O2 sat 100%  on 3 L.  GENERAL:  She is alert and oriented x3.  HEENT:  Normocephalic, atraumatic.  Her mucous membranes are slightly  dry.  She has no carotid bruits.  HEART:  Regular rate and rhythm, S1, S2.  LUNGS:  Decreased breath sounds throughout.  Few bilateral wheezes  although much improved.  EXTREMITIES:  No clubbing or cyanosis.  Trace pitting edema.   LAB WORK:  Chest x-ray is as per HPI.  Urinalysis just notes 0-2 white  cells, few bacteria, nothing remarkable.  BNP is normal at 94.  Cardiac  markers are unremarkable.  Lipase level normal.  Sodium 137, potassium  4.2, chloride 102, bicarb 26, BUN 30, creatinine 1.03, glucose 174.  LFTs are unremarkable.  White count 9.6, H and H 11.3 and 35, MCV of 82,  platelet count 236,000, 62% neutrophils.   ASSESSMENT AND PLAN:  1. Chronic  obstructive pulmonary disease exacerbation.  We will go      ahead and do nebulizers.  I do not think we will need supplemental      oxygen.  I do not think we need IV antibiotics at this time as      there are no signs of any white count, fever, or shift      differential.  If she has increased wheezing, we will start      steroids.  2. Hypertension.  Continue meds.  3. Glaucoma.  Continue meds.  4. Diabetes mellitus.  Check an A1c, follow sugars.      Hollice Espy, M.D.  Electronically Signed     SKK/MEDQ  D:  04/13/2008  T:  04/13/2008  Job:  [id number]   cc:   Duncan Dull, M.D.

## 2010-06-23 NOTE — Discharge Summary (Signed)
NAME:  Lori Long, Lori Long NO.:  [id number]   MEDICAL RECORD NO.:  [id number]          PATIENT TYPE:  OBV   LOCATION:  5524                         FACILITY:  [facility]   PHYSICIAN:  Hollice Espy, M.D.DATE OF BIRTH:  1923/06/29   DATE OF ADMISSION:  04/13/2008  DATE OF DISCHARGE:  04/16/2008                               DISCHARGE SUMMARY   The patient's PCP is Dr. Shaune Pollack.   DISCHARGE DIAGNOSES:  1. Left lower lobe pneumonia.  2. Acute renal failure, now resolved.  3. Chronic obstructive pulmonary disease exacerbation, improving.  4. Anemia which is stable.  5. Lumbar spinal stenosis.  6. History of glaucoma.  7. History of peripheral neuropathy.  8. History of hypothyroidism.  9. Diabetes mellitus.  10.Hypertension.   HOSPITAL COURSE:  The patient is an 77 year old white female with past  medical history of hypertension, diabetes and hypothyroidism who  presented with complaints of increased shortness of breath.  Initially  when she presented, she actually had a normal white count, no fever, but  was severely dyspneic and wheezing.  She started on nebulizer treatments  and supplemental oxygen.  By hospital day #2, she was feeling better  with decreased shortness of breath.  However, her white count started to  trend up, and she started having some mild renal failure.  She was on  some gentle IV fluids.  The patient also communicated at that time that  she had for some time now lower back pain with pain going down bilateral  aspects of both legs.  An MRI had been planned as an outpatient, but  because of the concern that perhaps the patient had deconditioning and  could not ambulate at home, the MRI was checked here.  The MRI itself  showed moderate to severe lumbar spinal stenosis with disk herniation at  L5, specifically moderate spinal stenosis, bilateral lateral recess  stenosis.  There was some right foraminal narrowing with slight  encroachment of  the L4 nerve root but not compression.  The patient was  evaluated by PT/OT and found to be doing relatively well with them.  The  plan will be for the patient to have an outpatient follow-up with  neurosurgery to see if any kind of neurosurgery process will be  indicated.  In regards to her pneumonia, once her white count started to  increase and she had a low grade fever on March 7, Avelox was added.  Her white count actually increased to 12.4.  We were concerned about  aspiration.  A swallowing study was ordered which the patient passed.  Speech therapy was only recommending small bites, but the patient was  changed over IV antibiotics.  By March 9, after she passed her  swallowing study, her white count actually had normalized, and she is  being changed back over to p.o. antibiotics.  In regards to her renal  failure, this improved with IV fluids, and her fluids were discontinued.  At this point, as of March 9, the patient was felt to be medically  stable.   Her disposition has improved.  Activity will be as  tolerated.  Discharge  diet will be a carb-modified, low-sodium diet, and she is being  discharged to home.   MEDICATIONS:  She will continue all of her previous medications as  follows:  1. Glipizide 10.  2. Neurontin 300 t.i.d.  3. Synthroid 75 mcg daily.  4. Combigan solution 1 drop ophthalmic each eye b.i.d.  5. Iron 27 mg daily.  6. PreserVision daily.  7. Diovan hydrochlorothiazide 320/12.5 p.o. daily.  8. Percocet 5/325 one p.o. q.4 h. p.r.n.  9. Avandia 4 daily.  10.Pravastatin 20 every night.  11.Restoril 15 every night.  12.Vitamin B12 two tablets p.o. daily.  13.Omega-3 two tablets p.o. daily.  14.Os-Cal D 2 tablets p.o. daily with vitamin D.  15.Metoprolol 25 p.o. b.i.d.   New medications:  Avelox 1 mg p.o. daily x4 days.   The patient will follow up with her PCP, Dr. Shaune Pollack, next week, and  she will also follow up with neurosurgery.  Contact  information has been  given; this will be [facility].      Hollice Espy, M.D.  Electronically Signed     SKK/MEDQ  D:  04/16/2008  T:  04/16/2008  Job:  [id number]   cc:   Duncan Dull, M.D.

## 2010-06-23 NOTE — Op Note (Signed)
NAME:  Lori Long, Lori Long NO.:  [id number]   MEDICAL RECORD NO.:  [id number]          PATIENT TYPE:  INP   LOCATION:  3030                         FACILITY:  [facility]   PHYSICIAN:  Cristi Loron, M.D.DATE OF BIRTH:  [birth year]   DATE OF PROCEDURE:  08/26/2008  DATE OF DISCHARGE:                               OPERATIVE REPORT   BRIEF HISTORY:  The patient is an 77 year old white female who has  suffered from back and leg pain consistent with neurogenic claudication.  She failed medical management worked up with lumbar MRI, which  demonstrated the patient had a disk degeneration and spondylolisthesis  with stenosis at L4-5.  I discussed the various treatment options with  the patient including surgery.  The patient has weighed the risks,  benefits, and alternatives of surgery, and decided to proceed with L4-5  decompression, instrumentation, and fusion.   PREOPERATIVE DIAGNOSES:  1. L4-5 spondylolisthesis.  2. Degeneration stenosis.  3. Lumbar radiculopathy/myelopathy and lumbago.   POSTOPERATIVE DIAGNOSES:  1. L4-5 spondylolisthesis.  2. Degeneration stenosis.  3. Lumbar radiculopathy/myelopathy and lumbago.   PROCEDURE:  Decompression L4 bilateral laminotomies to decompress the  bilateral L4-L5 nerve roots using a microdissection (work was done in  addition to work required to do posterior lumbar fusion because the  patient's severe facet arthropathy, spinal stenosis and foraminal  stenosis (L4-5 posterior lumbar interbody fusion with local morselized  autograft bone and Actifuse bone graft extender; insertion of L4-5  interbody prosthesis (Capstone PEEK interbody prosthesis); L4-5  posterior non-segmental instrumentation with Legacy titanium pedicle  screws and rods; L4-5 posterolateral arthrodesis with local morselized  autograft bone and Vitoss bone graft extenders).   SURGEON:  Cristi Loron, M.D.   ASSISTANT:  Hilda Lias, M.D.   ANESTHESIA:   General endotracheal.   ESTIMATED BLOOD LOSS:  200 mL.   SPECIMENS:  None.   DRAINS:  None.   COMPLICATIONS:  None.   DESCRIPTION OF PROCEDURE:  The patient was brought to the operating room  by the Anesthesia Team.  General endotracheal anesthesia was induced.  The patient was then turned to the prone position on the Wilson frame.  Her lumbosacral region was then prepared with Betadine scrub and  Betadine solution.  Sterile drapes were applied.  I then injected the  area to be incised with Marcaine with epinephrine solution and used a  scalpel to make it a linear midline incision over L4-5 interspace.  I  used electrocautery to perform a bilateral subperiosteal dissection  exposing spinous process and lamina of L4 and L5.  We obtained an  intraoperative radiograph to confirm our location.  We then inserted the  Versa-Trac retractor for exposure.   We began the decompression by using high-speed drill to perform  bilateral L4 laminotomies.  We widened the laminotomies with Kerrison  punch, decompressing the thecal sac and performing foraminotomies above  the bilateral L4 and L5 nerve root.  There was significant central  stenosis and bilateral femoral stenosis.  We got a good decompression of  the nerve elements.  We did encounter a midline durotomy at L4-5.  This  appeared to be result of  previous epidural steroid injection as  it was  absolutely in the midline in the interlaminar space and about the size  of the spinal needle.  We repaired this durotomy with a figure-of-eight  6-0 Prolene suture and then at the end of case placed some DuraSeal over  this.  Prior to placing of DuraSeal, we did have Anesthesia evaluate the  patient and there was no CSF leakage.  I should mention that we did this  decompression and repair the durotomy under magnification illumination  by the microscope.  I used microdissection.   Having completed the decompression, we now turned our attention   posterior lumbar interbody fusion.  We incised the L4-5 intervertebral  disks bilaterally with a 15-blade scalpel to perform a partial  intervertebral diskectomy with a pituitary forceps.  We then prepared  the vertebral endplates perfusion by using the curettes to clear off the  soft tissue.  We then used trial spacers and determined to use 8 x 26 mm  PEEK interbody prosthesis.  We prefilled this prosthesis with local  morselized autograft bone.  We obtained total decompression as well as  Actifuse bone graft extenders.  We inserted the prosthesis into the L4-5  interspaces bilaterally, of course after retracting the neural  structures out of arm's way bilaterally.  We also filled the remaining  of disk space with local morselized autograft and Actifuse and Vitoss  bone graft extenders completing the posterior lumbar interbody fusion.   We now turned our attention to the instrumentation.  Under fluoroscopic  guidance, we cannulated bilateral L4 and L5 pedicles with the bone  probes.  We tapped the pedicles with 6.5 mm tap.  We probed inside the  tapped pedicles to rule out cortical breaches.  We inserted 7.5 x 50 mm  pedicle screws bilaterally at L4 and L5 under fluoroscopic guidance.  We  palpated along the medial aspect of L4 and L5 pedicles and noted that  there was no cortical breaches.  We then connected unilateral screws  with a lordotic rod.  We secured the rod in place with the caps, which  we tightened appropriately.  This completed the instrumentation.   We then turned our attention to posterolateral arthrodesis.  We used a  high-speed drill to decorticate the remainder of L4 and L5 transverse  processes, pars at these etc.  We then laid a combination of local  autograft bone and Vitoss bone graft extenders over these decorticated  posterolateral structures.  This completed the posterolateral  arthrodesis.   We then inspected the thecal sac in bilateral L4 and L5 nerve roots  and  noted that they were well decompressed.  We then obtained hemostasis  using bipolar cautery.  We irrigated the wound out with bacitracin  solution.  We removed the retractor and then reapproximated the  patient's thoracolumbar fascia with interrupted #1 Vicryl suture,  subcutaneous tissue with a 2-0 Vicryl suture, and the skin with Steri-  Strips and benzoin.  The wound was then coated with bacitracin ointment.  Sterile dressings applied.  The drapes were removed and the patient was  subsequently returned to the supine position where she was extubated by  the Anesthesia Team and transported to the postanesthesia care unit in  stable condition.  All sponge, instrument, and needle counts were  correct at the end of this case.      Cristi Loron, M.D.  Electronically Signed     JDJ/MEDQ  D:  08/26/2008  T:  08/27/2008  Job:  [id number]

## 2010-06-23 NOTE — Procedures (Signed)
CAROTID DUPLEX EXAM   INDICATION:  Follow-up carotid artery disease.   HISTORY:  Diabetes:  Yes  Cardiac:  No  Hypertension:  Yes  Smoking:  No  Previous Surgery:  No  CV History:  Asymptomatic  Amaurosis Fugax No, Paresthesias No, Hemiparesis No  MRA on September 13, 2007 showed evidence of greater than 75% stenosis in  left ICA (distal cavernous segment).                                       RIGHT             LEFT  Brachial systolic pressure:         202               200  Brachial Doppler waveforms:         WNL               WNL  Vertebral direction of flow:        Antegrade         Antegrade  DUPLEX VELOCITIES (cm/sec)  CCA peak systolic                   126               153  ECA peak systolic                   178               101  ICA peak systolic                   158               118  ICA end diastolic                   32                30  PLAQUE MORPHOLOGY:                  Calcific          Calcific  PLAQUE AMOUNT:                      Moderate          Moderate  PLAQUE LOCATION:                    ICA/ECA/CCA       ICA/ECA/CCA   IMPRESSION:  1. Bilateral internal carotid arteries show evidence of 40%-59%      stenosis.  2. Unable to image area of MRA findings due to limitations of      ultrasound.  3. No significant changes from previous study.   ___________________________________________  [name]. Edilia Bo, M.D.   AS/MEDQ  D:  11/28/2008  T:  11/29/2008  Job:  [id number]

## 2010-06-23 NOTE — Procedures (Signed)
CAROTID DUPLEX EXAM   INDICATION:  Follow up carotid artery disease.   HISTORY:  Diabetes:  Yes.  Cardiac:  No.  Hypertension:  Yes.  Smoking:  No.  Previous Surgery:  No.  CV History:  No.  Amaurosis Fugax No, Paresthesias No, Hemiparesis No.  MRA on 09/13/07 showed evidence of >75% stenosis in left ICA (distal  cavernous segment).                                       RIGHT             LEFT  Brachial systolic pressure:         185               184  Brachial Doppler waveforms:         Triphasic         Triphasic  Vertebral direction of flow:        Antegrade         Antegrade  DUPLEX VELOCITIES (cm/sec)  CCA peak systolic                   95                95  ECA peak systolic                   170               107  ICA peak systolic                   146               121  ICA end diastolic                   44                39  PLAQUE MORPHOLOGY:                  Mixed             Mixed  PLAQUE AMOUNT:                      Moderate          Moderate  PLAQUE LOCATION:                    ICA/ECA/CCA       ICA/CCA/ECA   IMPRESSION:  1. Bilateral internal carotid arteries show evidence of 40-59%      stenosis.  2. Unable to image area of MRA findings due to limitations of      ultrasound.      ___________________________________________  [name]. Edilia Bo, M.D.   AS/MEDQ  D:  10/10/2007  T:  10/10/2007  Job:  [id number]

## 2010-06-23 NOTE — Procedures (Signed)
CAROTID DUPLEX EXAM   INDICATION:  Right carotid bruit.   HISTORY:  Diabetes:  Borderline.  Cardiac:  No.  Hypertension:  Yes.  Smoking:  No.  Previous Surgery:  No.  CV History:  Patient reports periodic left arm tingling, periodic dark  spots in her eyes bilaterally.  Amaurosis Fugax No, Paresthesias Yes, Hemiparesis No                                       RIGHT             LEFT  Brachial systolic pressure:         200               190  Brachial Doppler waveforms:         Triphasic         Triphasic  Vertebral direction of flow:        Antegrade         Antegrade  DUPLEX VELOCITIES (cm/sec)  CCA peak systolic                   118               104  ECA peak systolic                   159               141  ICA peak systolic                   168               131  ICA end diastolic                   31                30  PLAQUE MORPHOLOGY:                  Calcified         Soft  PLAQUE AMOUNT:                      Moderate          Mild  PLAQUE LOCATION:                    Distal CCA, proximal ICA            Proximal ICA   IMPRESSION:  1. 40-59% right internal carotid artery stenosis.  2. 40-59% left internal carotid artery stenosis (low end of range).  3. 3.0 X 2.5 cm vascularized structure seen on the right side of the      thyroid.  Further clinical correlation is recommended.   ___________________________________________  Janetta Hora. Fields, MD   MC/MEDQ  D:  02/27/2007  T:  02/28/2007  Job:  [id number]

## 2010-06-26 NOTE — Procedures (Signed)
[facility]  Patient:    Lori Long, Lori Long                    MRN: [id number] Proc. Date: 07/04/00 Adm. Date:  [id number] Attending:  Thyra Breed CC:         Georgann Housekeeper, M.D.   Procedure Report  PROCEDURE:  Lumbar epidural steroid injection.  DIAGNOSIS:  Lumbar spondylosis with lumbar spinal stenosis.  HISTORY:  Lori Long is a 77 year old who presents with a history of about six months of gradual onset of lower back discomfort, radiating out to the right hip.  Over the past two months, she has developed intermittent numbness and tingling of the buttock.  She notes that going up steps or walking any distance brings on a severe pain into her right buttock region with numbness out into the lateral aspect of her right calf and the dorsum of her foot.  She notes that if she stops and rests, it gets better.  She has also noted that applying Arthritis Hot Rub helps her discomfort.  She denies any weakness or bowel or bladder incontinence.  She describes the pain as a shooting, sharp, pressing, stinging type of discomfort.  She is currently on Diovan and Lipitor.  ALLERGIES:  She is intolerant of MORPHINE and CODEINE with GI problems.  She denies other drug allergies.  ACTIVE MEDICAL PROBLEMS:  Hypertension, history of peptic ulcer disease, and osteoarthritis.  FAMILY HISTORY:  Positive for diabetes, thyroid disease, cancers, seizure disorder, osteoarthritis, and hypertension.  PAST SURGICAL HISTORY:  Exploratory laparotomy years ago where she was found to have inflammation of her bowels, C-section, cholecystectomy, and hysterectomy.  She intact ovaries.  SOCIAL HISTORY:  The patient is a nonsmoker, nondrinker.  She is retired from [employer] work.  She worked predominantly in the [employer].  REVIEW OF SYSTEMS:  GENERAL:  Negative.  HEAD:  Significant for history of severe headaches which have become rare now.  EYES:  Significant for  glasses. NOSE/MOUTH/THROAT:  Negative.  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  PULMONARY:  Significant for recurrent bronchitis associated with dust exposures.  Sounds like she may have an element of asthma. CARDIOVASCULAR:  Significant for 18 years of hypertension.  GI:  Significant for history of peptic ulcer disease and intermittent constipation which she attributes to milk products.  GU:  Negative.  MUSCULOSKELETAL:  See HPI.  Plus history of arthritis of the hands.  NEUROLOGICAL:  See HPI.  No history of seizure or stroke.  ENDOCRINE:  Apparently she has been told she has osteoporosis, but she has no history of diabetes or thyroid disorder. HEMATOLOGIC:  Remote history of iron deficiency anemia likely related to underlying peptic ulcer disease.  CUTANEOUS:  Negative. PSYCHIATRIC:  Negative.  ALLERGY/IMMUNOLOGIC:  Positive for allergic rhinitis.  PHYSICAL EXAMINATION:  VITAL SIGNS:  Blood pressure 174/84, heart rate 82, respiratory rate 18, O2 saturations 99%.  GENERAL:  This is a pleasant female in no acute distress.  HEENT:  Head is normocephalic, atraumatic.  Eyes with extraocular movements intact with conjunctive sclerae clear.  Nose with patent nares without discharge.  Oropharynx demonstrates good dentition with no mucosal lesions.  NECK:  Neck demonstrated good range of motion with carotids 2+ and symmetric without bruits.  HEART:  Occasional skipped beat but otherwise regular rate and rhythm.  LUNGS:  Clear.  BREASTS/ABDOMINAL/PELVIC/RECTAL:  Not performed.  BACK:  Back exam reveals no tenderness to percussion over the vertebrae with positive bilateral  straight leg raise signs, eliciting nerve discomfort at 30 degrees on the left and 40 degrees on the right.  Gait is intact.  EXTREMITIES:  The patient demonstrates some mild bony enlargement of the PIPs, DIPs, and first carpal metacarpal joints of the hands as well as first MTPs of the feet.  Dorsalis  pedis pulses are 1-2+ and radial pulses were 2+ and symmetric.  NEUROLOGIC:  The patient is oriented to person, place, time, and reason for visit.  Cranial nerves 2-12 are grossly intact except for decreased hearing acuity of the right ear.  Deep tendon reflexes are symmetric in the upper extremities with downgoing toes.  Motor is 5/5 with symmetric bulk and tone. Sensory is intact to vibratory sense and pinprick.  Light touch is intact. Finger-to-nose revealed that she had a mild tremor.  Otherwise coordination is intact.  An MRI interpretation was forwarded with the patient which demonstrated severe central canal stenosis at L4-5 with lumbar spondylosis affecting the neuroforamen with neuroforaminal stenosis with bulging disk and grade 1 spondylolithesis of 4 and 5.  IMPRESSION: 1. Low back pain syndrome with pseudoclaudication out into the right lower    extremity likely on the basis of her spinal stenosis and neuroforaminal    stenosis secondary to spondylosis. 2. Other medical problems per Dr. Donette Larry which include hypertension, history    of peptic ulcer disease, osteoarthritis, probable underlying asthma,    osteoporosis, history of iron deficiency anemia, and allergic rhinitis.  DISPOSITION:  I discussed with the patient the potential risks, benefits, and limitations of a lumbar epidural steroid injection in detail.  She is interested in pursuing this.  She is aware of the side effects of corticosteroids.  DESCRIPTION OF PROCEDURE:  After informed consent was obtained, the patient was placed in a sitting position and monitored.  Her neck was prepped with Betadine x 3.  A skin wheal was raised at the L4-5 interspace with 1% lidocaine.  A 20 gauge Tuohy needle was introduced to the lumbar epidural space to loss of resistance to preservative-free normal saline.  There was no CSF nor blood.  Very gently, I injected 7 mL of preservative free normal  saline mixed with 80 mg of  Medrol.  The patient tolerated this well.  Postprocedure condition - stable.  DISCHARGE INSTRUCTIONS: 1. Resume previous diet. 2. Limitations and activities per instruction sheet, as outlined by my    assistant today. 3. Continue on current medications. 4. Follow up with me in one to two weeks for a repeat injection.  She is aware    that this is a series of three but if she does not respond to the first    two, we will hold off on further injections. DD:  07/04/00 TD:  07/04/00 Job: [id number]

## 2010-06-26 NOTE — Procedures (Signed)
[facility]  Patient:    Lori Long, Lori Long                    MRN: [id number] Proc. Date: 07/11/00 Adm. Date:  [id number] Attending:  Thyra Breed CC:         Georgann Housekeeper, M.D.   Procedure Report  PROCEDURE:  Lumbar epidural steroid injection.  ANESTHESIOLOGIST:  Thyra Breed, M.D.  DIAGNOSIS:  Lumbar spinal stenosis with underlying lumbar spondylosis.  INTERVAL HISTORY:  The patient has noted a marked improvement with regard to her pain down to 6/10, which is half of what it was when she presented.  She is very pleased.  EXAMINATION:  Blood pressure 163/63.  Heart rate is 75.  Respiratory rate is 16.  O2 saturation is 99%.  Pain level is 6/10.  She shows good healing of her previous injection site.  DESCRIPTION OF PROCEDURE:  After informed consent was obtained, the patient was placed in a sitting position and monitored.  Her back was prepped with Betadine x 3.  A skin wheal was raised at the L4-5 interspace with 1% lidocaine.  A 20-gauge Tuohy needle was introduced to the lumbar epidural space to loss of resistance to preservative-free normal saline.  There was no CSF nor blood.  The depth was 4. cm.  I injected Medrol 80 mg in 8 ml of preservative-free normal saline.  The needle was flushed and removed intact.  POST-PROCEDURAL CONDITION:  Stable.  DISCHARGE INSTRUCTIONS: 1. Resume previous diet. 2. Limitation in activities per instruction sheet as outlined by my assistant    today. 3. Continue on current medications. 4. Follow up with me in one week for third injection. DD:  07/11/00 TD:  07/11/00 Job: [id number]

## 2010-06-26 NOTE — Discharge Summary (Signed)
NAME:  Lori Long, Lori Long NO.:  [id number]   MEDICAL RECORD NO.:  [id number]          PATIENT TYPE:  INP   LOCATION:  3041                         FACILITY:  [facility]   PHYSICIAN:  Cristi Loron, M.D.DATE OF BIRTH:  1923/12/31   DATE OF ADMISSION:  08/26/2008  DATE OF DISCHARGE:  09/04/2008                               DISCHARGE SUMMARY   BRIEF HISTORY:  The patient is an 77 year old white female who has  suffered from back and leg pain consistent with neurogenic claudication.  She has failed medical management and was worked up with lumbar MRI,  which demonstrated she had disk degeneration with a spondylolisthesis  and stenosis L4-5.  I discussed the various treatment options with the  patient including surgery.  The patient has weighed the risks, benefits,  and alternatives of surgery and decided to proceed with L4-5  decompression, instrumentation, and fusion.   For further details of this admission, please refer to typed history and  physical.   HOSPITAL COURSE:  I admitted the patient to [facility] on August 26, 2008.  On day of admission, we performed L5 decompression,  instrumentation, and fusion.  The surgery went well (for full details of  this operation, please refer to typed operative note).   POSTOPERATIVE COURSE:  The patient's postoperative course was as  follows:  The patient was slow to mobilize.  We had PT, OT, and PM and R  see the patient.  The patient was a bit of tachycardic.  We had an EKG  and cardiac enzymes were turned out to be okay.  She had a fever.  A  chest x-ray demonstrated a questionable pneumonia.  She was started on  empiric Avelox.  The patient was also noted to be anemic and was  transfused with 2 units of packed red blood cells.  Her posttransfusion  H and H was 10.5 and 31.5.  The patient did develop a rash, we thought  perhaps it was secondary to Avelox which was stopped.  Then, we later  attributed due to a yeast  rash.  She was started on Mycostatin powder  and later Diflucan.  The patient did have a bit of urinary retention,  had a Foley catheter replaced.  PM and R did not think the patient  needed inpatient rehab, and by September 04, 2008, the patient was afebrile,  her vital signs were stable, her rash was improving, and she was  requesting discharge to home.   DISCHARGE INSTRUCTIONS:  The patient was given written discharge  instructions.  Instructed to follow up with me in 4 weeks.   DISCHARGE PRESCRIPTIONS:  1. Percocet 5/325, #100 one p.o. q.4 h. p.r.n. for pain.  2. Flexeril 5 mg, #50 one p.o. q.8 h. p.r.n. muscle spasms.   FINAL DIAGNOSES:  1. L4-5 spondylolisthesis, degenerative disk disease, stenosis, lumbar      radiculopathy, lumbago.  2. Pneumonia.  3. Blood loss anemia.  4. Hypertension.  5. Glaucoma.  6. Neuropathy.  7. Hypoxemia.   PROCEDURES PERFORMED:  1. Decompressive bilateral L4 laminotomies to decompress bilateral L4  and L5 nerve roots using microdissection (work was in addition to      work required due to posterior lumbar interbody fusion because of      the patient's severe facet arthropathy, spinal stenosis and      foraminal stenosis).  2. L4-5 posterior lumbar interbody fusion with local morselized      autograft bone and Actifuse bone graft extender.  3. Insertion of L4-5 interbody prosthesis (PEEK interbody prosthesis).  4. L4-5 posterior nonsegmental instrumentation with Legacy titanium      pedicle screw rods.  5. L4-5 posterolateral arthrodesis with local morselized autograft      bone and Vitoss bone graft extenders.      Cristi Loron, M.D.  Electronically Signed     JDJ/[id number]  D:  09/19/2008  T:  09/20/2008  Job:  [id number]

## 2010-06-26 NOTE — Procedures (Signed)
[facility]  Patient:    Lori Long, Lori Long                    MRN: [id number] Proc. Date: 07/18/00 Adm. Date:  [id number] Attending:  Thyra Breed CC:         Tyson Dense, M.D.   Procedure Report  PROCEDURE:  Lumbar epidural steroid injection.  DIAGNOSIS:  Lumbar spinal stenosis with underlying lumbar spondylosis.  INTERVAL HISTORY:  The patient continues to note pretty marked improvement with regard to her pain. She has noted that her varicose veins feel like they are popped out more since she has gotten her last injection.  PHYSICAL EXAMINATION:  Blood pressure 194/92, heart rate 84, respiratory rate 16, O2 saturations 99% and pain level is 4/10. Her back shows good healing from a previous injection site.  DESCRIPTION OF PROCEDURE:  After informed consent was obtained, the patient was placed in the sitting position and monitored. The patients back was prepped with Betadine x 3. A skin wheal was raised at the L4-5 interspace with 1 percent lidocaine. A 20 gauge Tuohy needle was introduced to the lumbar epidural space to loss of resistance to preservative free normal saline. There was no cerebrospinal fluid nor blood. 80 mg of Medrol and 8 ml of preservative free normal saline was gently injected. The needle was flushed with preservative free normal saline and removed intact.  CONDITION POST PROCEDURE:  Stable.  DISCHARGE INSTRUCTIONS:  Resume previous diet. Limitations in activities per instruction sheet. Continue on current medications. The patient plans to follow-up with Dr. Eula Listen. She is aware that she cannot get another series of epidurals for at least six months. DD:  07/18/00 TD:  07/18/00 Job: [id number]

## 2010-06-26 NOTE — Procedures (Signed)
[facility]  Patient:    Lori Long, Lori Long                   MRN: [id number] Proc. Date: 06/20/00 Attending:  Verlin Grills, M.D. CC:         Tyson Dense, M.D.   Procedure Report  DATE OF BIRTH:  03-13-1923  REFERRING PHYSICIAN:  PROCEDURE PERFORMED:  Colonoscopy.  ENDOSCOPIST:  Verlin Grills, M.D.  INDICATIONS FOR PROCEDURE:  The patient is a 77 year old female referred by Tyson Dense, M.D. for surveillance colonoscopy and polypectomy to prevent colon cancer.  I discussed with the patient the complications associated with colonoscopy and polypectomy including intestinal bleeding and intestinal perforation.  The patient has signed the operative permit.  PREMEDICATION:  Versed 5 mg, fentanyl 50 mcg.  ENDOSCOPE:  Olympus pediatric video colonoscope.  DESCRIPTION OF PROCEDURE:  After obtaining informed consent, the patient was placed in the left lateral decubitus position.  I administered intravenous fentanyl and intravenous Versed to achieve conscious sedation for the procedure.  The patients blood pressure, oxygen saturation and cardiac rhythm were monitored throughout the procedure and documented in the medical record.  Anal inspection was normal.  Digital rectal exam was normal.  The Olympus pediatric video colonoscope was then introduced into the rectum and under direct vision, advanced to the cecum as identified by a normal-appearing ileocecal valve.  Colonic preparation for the exam today was excellent.  Rectum:  Normal.  Sigmoid colon and descending colon:  At approximately 40 cm from the anal verge, a 1 mm sessile polyp was removed with the electrocautery snare.  The polyp was not retrieved for pathological evaluation.  Splenic flexure:  Normal.  Transverse colon:  Normal.  Hepatic flexure:  Normal.  Ascending colon:  Normal.  Cecum and ileocecal valve:  Normal.  ASSESSMENT:  From the sigmoid  colon, at 40 cm from the anal  verge, a 1 mm sessile polyp was removed with electrocautery snare but the polyp was not retrieved for pathological evaluation.  Otherwise normal proctocolonoscopy to the cecum.  RECOMMENDATIONS:  Repeat colonoscopy in 5 to 10 years. DD:  06/20/00 TD:  06/20/00 Job: [id number]

## 2010-11-03 LAB — URINALYSIS, ROUTINE W REFLEX MICROSCOPIC
Bilirubin Urine: NEGATIVE
Hgb urine dipstick: NEGATIVE
Nitrite: NEGATIVE
Specific Gravity, Urine: 1.009
pH: 6

## 2010-11-03 LAB — CBC
HCT: 27.5 — ABNORMAL LOW
Hemoglobin: 9.2 — ABNORMAL LOW
RBC: 3.02 — ABNORMAL LOW
RDW: 15.1

## 2010-11-03 LAB — BASIC METABOLIC PANEL
CO2: 27
Calcium: 9.5
GFR calc Af Amer: >60
GFR calc non Af Amer: 60 — ABNORMAL LOW
Glucose, Bld: 175 — ABNORMAL HIGH
Potassium: 5
Sodium: 134 — ABNORMAL LOW

## 2010-11-03 LAB — DIFFERENTIAL
Basophils Absolute: 0
Eosinophils Absolute: 0
Lymphs Abs: 0.5 — ABNORMAL LOW
Monocytes Absolute: 0 — ABNORMAL LOW
Monocytes Relative: 0 — ABNORMAL LOW
Neutro Abs: 24.8 — ABNORMAL HIGH

## 2010-11-04 LAB — BASIC METABOLIC PANEL
BUN: 26 — ABNORMAL HIGH
CO2: 27
Calcium: 9.7
Chloride: 105
Creatinine, Ser: 0.85
Glucose, Bld: 160 — ABNORMAL HIGH

## 2010-11-04 LAB — CBC
MCHC: 32.6
MCV: 81.6
RBC: 4.46
RDW: 18.2 — ABNORMAL HIGH

## 2010-11-04 LAB — URINALYSIS, ROUTINE W REFLEX MICROSCOPIC
Bilirubin Urine: NEGATIVE
Glucose, UA: >1000 — AB
Ketones, ur: NEGATIVE
Nitrite: NEGATIVE
Protein, ur: NEGATIVE
pH: 6

## 2010-11-04 LAB — DIFFERENTIAL
Basophils Absolute: 0.1
Basophils Relative: 1
Eosinophils Absolute: 0.2
Monocytes Relative: 8
Neutro Abs: 8.6 — ABNORMAL HIGH
Neutrophils Relative %: 70

## 2010-11-04 LAB — URINE MICROSCOPIC-ADD ON

## 2010-11-04 LAB — PROTIME-INR: INR: 0.9

## 2010-11-25 LAB — URINE MICROSCOPIC-ADD ON

## 2010-11-25 LAB — BASIC METABOLIC PANEL
Calcium: 9.5
GFR calc Af Amer: 37 — ABNORMAL LOW
GFR calc non Af Amer: 30 — ABNORMAL LOW
Potassium: 3.9
Sodium: 136

## 2010-11-25 LAB — URINALYSIS, ROUTINE W REFLEX MICROSCOPIC
Bilirubin Urine: NEGATIVE
Glucose, UA: NEGATIVE
Hgb urine dipstick: NEGATIVE
Nitrite: NEGATIVE
Specific Gravity, Urine: 1.02
pH: 5.5

## 2010-11-25 LAB — CBC
Hemoglobin: 12.8
RBC: 4.66

## 2010-11-25 LAB — DIFFERENTIAL
Lymphocytes Relative: 27
Lymphs Abs: 4 — ABNORMAL HIGH
Monocytes Absolute: 1.2 — ABNORMAL HIGH
Monocytes Relative: 8
Neutro Abs: 9.1 — ABNORMAL HIGH

## 2010-11-25 LAB — URINE CULTURE

## 2011-02-11 ENCOUNTER — Other Ambulatory Visit: Payer: Self-pay | Admitting: Otolaryngology

## 2011-02-11 DIAGNOSIS — H60399 Other infective otitis externa, unspecified ear: Secondary | ICD-10-CM

## 2011-02-11 DIAGNOSIS — H9190 Unspecified hearing loss, unspecified ear: Secondary | ICD-10-CM

## 2011-02-16 ENCOUNTER — Ambulatory Visit
Admission: RE | Admit: 2011-02-16 | Discharge: 2011-02-16 | Disposition: A | Discharge status: Home / Self Care | Payer: Medicare Other | Source: Ambulatory Visit | Attending: Otolaryngology | Admitting: Otolaryngology

## 2011-02-16 DIAGNOSIS — H9190 Unspecified hearing loss, unspecified ear: Secondary | ICD-10-CM

## 2011-02-16 DIAGNOSIS — H60399 Other infective otitis externa, unspecified ear: Secondary | ICD-10-CM

## 2011-02-23 ENCOUNTER — Emergency Department (HOSPITAL_COMMUNITY)
Admission: EM | Admit: 2011-02-23 | Discharge: 2011-02-23 | Disposition: A | Discharge status: Home / Self Care | Payer: Medicare Other | Attending: Emergency Medicine | Admitting: Emergency Medicine

## 2011-02-23 ENCOUNTER — Emergency Department (HOSPITAL_COMMUNITY): Payer: Medicare Other

## 2011-02-23 ENCOUNTER — Encounter (HOSPITAL_COMMUNITY): Payer: Self-pay

## 2011-02-23 DIAGNOSIS — E119 Type 2 diabetes mellitus without complications: Secondary | ICD-10-CM | POA: Insufficient documentation

## 2011-02-23 DIAGNOSIS — R197 Diarrhea, unspecified: Secondary | ICD-10-CM | POA: Insufficient documentation

## 2011-02-23 DIAGNOSIS — J4489 Other specified chronic obstructive pulmonary disease: Secondary | ICD-10-CM | POA: Insufficient documentation

## 2011-02-23 DIAGNOSIS — H3530 Unspecified macular degeneration: Secondary | ICD-10-CM | POA: Insufficient documentation

## 2011-02-23 DIAGNOSIS — J449 Chronic obstructive pulmonary disease, unspecified: Secondary | ICD-10-CM | POA: Insufficient documentation

## 2011-02-23 DIAGNOSIS — R112 Nausea with vomiting, unspecified: Secondary | ICD-10-CM | POA: Insufficient documentation

## 2011-02-23 DIAGNOSIS — E7800 Pure hypercholesterolemia, unspecified: Secondary | ICD-10-CM | POA: Insufficient documentation

## 2011-02-23 DIAGNOSIS — I10 Essential (primary) hypertension: Secondary | ICD-10-CM | POA: Insufficient documentation

## 2011-02-23 DIAGNOSIS — Z860100 Personal history of colon polyps, unspecified: Secondary | ICD-10-CM | POA: Insufficient documentation

## 2011-02-23 DIAGNOSIS — E860 Dehydration: Secondary | ICD-10-CM | POA: Insufficient documentation

## 2011-02-23 DIAGNOSIS — H409 Unspecified glaucoma: Secondary | ICD-10-CM | POA: Insufficient documentation

## 2011-02-23 DIAGNOSIS — E079 Disorder of thyroid, unspecified: Secondary | ICD-10-CM | POA: Insufficient documentation

## 2011-02-23 DIAGNOSIS — M129 Arthropathy, unspecified: Secondary | ICD-10-CM | POA: Insufficient documentation

## 2011-02-23 DIAGNOSIS — I2510 Atherosclerotic heart disease of native coronary artery without angina pectoris: Secondary | ICD-10-CM | POA: Insufficient documentation

## 2011-02-23 DIAGNOSIS — M899 Disorder of bone, unspecified: Secondary | ICD-10-CM | POA: Insufficient documentation

## 2011-02-23 HISTORY — DX: Disorder of kidney and ureter, unspecified: N28.9

## 2011-02-23 HISTORY — DX: Dorsalgia, unspecified: M54.9

## 2011-02-23 HISTORY — DX: Unspecified osteoarthritis, unspecified site: M19.90

## 2011-02-23 HISTORY — DX: Other specified disorders of bone density and structure, unspecified site: M85.80

## 2011-02-23 HISTORY — DX: Peptic ulcer, site unspecified, unspecified as acute or chronic, without hemorrhage or perforation: K27.9

## 2011-02-23 HISTORY — DX: Typhoid fever, unspecified: A01.00

## 2011-02-23 HISTORY — DX: Pure hypercholesterolemia, unspecified: E78.00

## 2011-02-23 HISTORY — DX: Sleep apnea, unspecified: G47.30

## 2011-02-23 HISTORY — DX: Chronic obstructive pulmonary disease, unspecified: J44.9

## 2011-02-23 HISTORY — DX: Atherosclerotic heart disease of native coronary artery without angina pectoris: I25.10

## 2011-02-23 HISTORY — DX: Unspecified macular degeneration: H35.30

## 2011-02-23 HISTORY — DX: Essential (primary) hypertension: I10

## 2011-02-23 HISTORY — DX: Disorder of thyroid, unspecified: E07.9

## 2011-02-23 LAB — CBC
HCT: 35.7 % — ABNORMAL LOW (ref 36.0–46.0)
MCV: 84.2 fL (ref 78.0–100.0)
RDW: 13.6 % (ref 11.5–15.5)
WBC: 8.6 10*3/uL (ref 4.0–10.5)

## 2011-02-23 LAB — DIFFERENTIAL
Basophils Absolute: 0 10*3/uL (ref 0.0–0.1)
Eosinophils Relative: 1 % (ref 0–5)
Lymphocytes Relative: 25 % (ref 12–46)
Lymphs Abs: 2.1 10*3/uL (ref 0.7–4.0)
Monocytes Absolute: 0.8 10*3/uL (ref 0.1–1.0)

## 2011-02-23 LAB — URINALYSIS, ROUTINE W REFLEX MICROSCOPIC
Bilirubin Urine: NEGATIVE
Glucose, UA: NEGATIVE mg/dL
Ketones, ur: 40 mg/dL — AB
pH: 8.5 — ABNORMAL HIGH (ref 5.0–8.0)

## 2011-02-23 LAB — COMPREHENSIVE METABOLIC PANEL
Albumin: 3.7 g/dL (ref 3.5–5.2)
Alkaline Phosphatase: 64 U/L (ref 39–117)
BUN: 15 mg/dL (ref 6–23)
Calcium: 9.1 mg/dL (ref 8.4–10.5)
Creatinine, Ser: 0.74 mg/dL (ref 0.50–1.10)
GFR calc Af Amer: 87 mL/min — ABNORMAL LOW (ref >90)
Glucose, Bld: 107 mg/dL — ABNORMAL HIGH (ref 70–99)
Total Protein: 6.7 g/dL (ref 6.0–8.3)

## 2011-02-23 LAB — LIPASE, BLOOD: Lipase: 34 U/L (ref 11–59)

## 2011-02-23 MED ORDER — SODIUM CHLORIDE 0.9 % IV BOLUS (SEPSIS)
1000.0000 mL | Freq: Once | INTRAVENOUS | Status: AC
  Administered 2011-02-23: 1000 mL via INTRAVENOUS

## 2011-02-23 MED ORDER — SODIUM CHLORIDE 0.9 % IV SOLN: 999.0000 mL | INTRAVENOUS | Status: DC

## 2011-02-23 MED ORDER — ONDANSETRON HCL 4 MG/2ML IJ SOLN
4.0000 mg | Freq: Once | INTRAMUSCULAR | Status: AC
  Administered 2011-02-23: 4 mg via INTRAVENOUS
  Filled 2011-02-23: qty 2

## 2011-02-23 MED ORDER — ONDANSETRON 8 MG PO TBDP: 8.0000 mg | ORAL_TABLET | Freq: Three times a day (TID) | ORAL | Status: AC | PRN

## 2011-02-23 NOTE — ED Notes (Signed)
Per her family sent here by PCP for evaluation of flu and dehydration.

## 2011-02-23 NOTE — ED Provider Notes (Signed)
History     CSN: [id number]  Arrival date & time 02/23/11  1126   First MD Initiated Contact with Patient 02/23/11 1202      Chief Complaint  Patient presents with  . Influenza  . Dehydration    (Consider location/radiation/quality/duration/timing/severity/associated sxs/prior treatment) Patient is a 78 y.o. female presenting with flu symptoms. The history is provided by the patient and a relative.  Influenza This is a new problem. The current episode started more than 2 days ago. The problem occurs constantly. The problem has been gradually worsening. Pertinent negatives include no chest pain.  Family states the primary symptoms have been vomiting and diarrhea.  She has had some cough and body aches.  She vomited four times this am.  Every hour with diarrhea but that has decreased significantly since yesterday. Patient went to her primary care doctor today who sent her to the emergency room because of concerns of dehydration and possible influenza. Patient did get her flu shot this year. She denies any pain at this time.  Past Medical History  Diagnosis Date  . Coronary artery disease   . Hypertension   . Arthritis   . Diabetes mellitus   . Renal disorder   . Sleep apnea   . Thyroid disease   . Glaucoma   . Osteopenia   . Elevated cholesterol   . Back pain   . PUD (peptic ulcer disease)   . Macular degeneration   . Typhoid fever   . COPD (chronic obstructive pulmonary disease)     Past Surgical History  Procedure Date  . Cholecystectomy   . Abdominal hysterectomy   . Cataract extraction   . Back surgery   . Thyroid surgery     No family history on file.  History  Substance Use Topics  . Smoking status: Never Smoker   . Smokeless tobacco: Not on file  . Alcohol Use: No    OB History    Grav Para Term Preterm Abortions TAB SAB Ect Mult Living                  Review of Systems  Cardiovascular: Negative for chest pain.  All other systems reviewed and  are negative.    Allergies  Codeine; Lipitor; Metformin and related; Morphine and related; and Vytorin  Home Medications  No current outpatient prescriptions on file.  BP 176/96  Pulse 120  Temp(Src) 98.5 F (36.9 C) (Oral)  Resp 20  SpO2 100%  Physical Exam  Constitutional: She is oriented to person, place, and time. She appears well-developed. No distress.  HENT:  Head: Normocephalic.  Right Ear: External ear normal.  Left Ear: External ear normal.       His membranes are dry  Eyes:       presbycusia  Neck: Neck supple. No tracheal deviation present. No thyromegaly present.  Cardiovascular: Regular rhythm.  Tachycardia present.  Exam reveals no friction rub.   No murmur heard. Pulmonary/Chest: No respiratory distress. She has no wheezes. She has no rales.  Abdominal: She exhibits no distension. There is no tenderness. There is no rebound and no guarding.  Musculoskeletal: She exhibits no edema and no tenderness.  Neurological: She is oriented to person, place, and time. No cranial nerve deficit. She exhibits normal muscle tone.  Skin: Skin is warm and dry. She is not diaphoretic.  Psychiatric: She has a normal mood and affect.    ED Course  Procedures (including critical care time)  Labs Reviewed  CBC - Abnormal; Notable for the following:    HCT 35.7 (*)    All other components within normal limits  URINALYSIS, ROUTINE W REFLEX MICROSCOPIC - Abnormal; Notable for the following:    pH 8.5 (*)    Ketones, ur 40 (*)    All other components within normal limits  COMPREHENSIVE METABOLIC PANEL - Abnormal; Notable for the following:    Sodium 131 (*)    Potassium 3.2 (*)    Chloride 95 (*)    Glucose, Bld 107 (*)    GFR calc non Af Amer 75 (*)    GFR calc Af Amer 87 (*)    All other components within normal limits  DIFFERENTIAL  LIPASE, BLOOD  COMPREHENSIVE METABOLIC PANEL  LIPASE, BLOOD   Dg Abd Acute W/chest  02/23/2011  *RADIOLOGY REPORT*  Clinical Data:  Vomiting, diarrhea, rule out obstruction  ACUTE ABDOMEN SERIES (ABDOMEN 2 VIEW & CHEST 1 VIEW)  Comparison: None.  Findings: Cardiomediastinal silhouette is unremarkable.  No acute infiltrate or pleural effusion.  No pulmonary edema.  There is nonspecific nonobstructive bowel gas pattern.  No free abdominal air.  Postsurgical changes are noted lumbar spine at L4 - L5 level.  IMPRESSION: No acute disease.  Nonspecific nonobstructive bowel gas pattern. No free abdominal air.  Postsurgical changes lumbar spine.  Original Report Authenticated By: Natasha Mead, M.D.     1. Vomiting and diarrhea   2. Dehydration       MDM  Patient is feeling better after IV fluids. She has not had any additional nausea or vomiting. Her electrolytes and laboratory results are reassuring. At this time we will for her fluids and ensure that she is able to eat and drink without vomiting.  Overall, suspect viral GE.  5:06 PM Pt will try to eat something now.  I have discussed with patient and her family and they are comfortable with this plan.   Dr Devoria Albe will check on pt to make sure she tolerates the oral challenge.         Celene Kras, MD 02/23/11 [phone]

## 2011-02-23 NOTE — ED Provider Notes (Signed)
Patient relates she has eaten and drank about 45 minutes ago and has no more nausea or vomiting. She states she feels like she can go home at this point. Family are here and are agreeable.  Ward Givens, MD 02/23/11 1818

## 2011-12-15 ENCOUNTER — Other Ambulatory Visit: Payer: Self-pay | Admitting: Neurosurgery

## 2011-12-15 DIAGNOSIS — M549 Dorsalgia, unspecified: Secondary | ICD-10-CM

## 2011-12-21 ENCOUNTER — Other Ambulatory Visit: Payer: Self-pay | Admitting: Neurosurgery

## 2011-12-21 ENCOUNTER — Ambulatory Visit
Admission: RE | Admit: 2011-12-21 | Discharge: 2011-12-21 | Disposition: A | Discharge status: Home / Self Care | Payer: Medicare Other | Source: Ambulatory Visit | Attending: Neurosurgery | Admitting: Neurosurgery

## 2011-12-21 DIAGNOSIS — M549 Dorsalgia, unspecified: Secondary | ICD-10-CM

## 2011-12-30 ENCOUNTER — Encounter: Payer: Self-pay | Admitting: Vascular Surgery

## 2012-01-25 ENCOUNTER — Encounter (HOSPITAL_COMMUNITY): Payer: Self-pay | Admitting: Emergency Medicine

## 2012-01-25 ENCOUNTER — Emergency Department (HOSPITAL_COMMUNITY): Payer: Medicare Other

## 2012-01-25 ENCOUNTER — Emergency Department (HOSPITAL_COMMUNITY)
Admission: EM | Admit: 2012-01-25 | Discharge: 2012-01-25 | Disposition: A | Discharge status: Home / Self Care | Payer: Medicare Other | Attending: Emergency Medicine | Admitting: Emergency Medicine

## 2012-01-25 DIAGNOSIS — R0789 Other chest pain: Secondary | ICD-10-CM | POA: Insufficient documentation

## 2012-01-25 DIAGNOSIS — G4730 Sleep apnea, unspecified: Secondary | ICD-10-CM | POA: Insufficient documentation

## 2012-01-25 DIAGNOSIS — M129 Arthropathy, unspecified: Secondary | ICD-10-CM | POA: Insufficient documentation

## 2012-01-25 DIAGNOSIS — Z8719 Personal history of other diseases of the digestive system: Secondary | ICD-10-CM | POA: Insufficient documentation

## 2012-01-25 DIAGNOSIS — Z8619 Personal history of other infectious and parasitic diseases: Secondary | ICD-10-CM | POA: Insufficient documentation

## 2012-01-25 DIAGNOSIS — J449 Chronic obstructive pulmonary disease, unspecified: Secondary | ICD-10-CM | POA: Insufficient documentation

## 2012-01-25 DIAGNOSIS — E7800 Pure hypercholesterolemia, unspecified: Secondary | ICD-10-CM | POA: Insufficient documentation

## 2012-01-25 DIAGNOSIS — M949 Disorder of cartilage, unspecified: Secondary | ICD-10-CM | POA: Insufficient documentation

## 2012-01-25 DIAGNOSIS — I2510 Atherosclerotic heart disease of native coronary artery without angina pectoris: Secondary | ICD-10-CM | POA: Insufficient documentation

## 2012-01-25 DIAGNOSIS — I129 Hypertensive chronic kidney disease with stage 1 through stage 4 chronic kidney disease, or unspecified chronic kidney disease: Secondary | ICD-10-CM | POA: Insufficient documentation

## 2012-01-25 DIAGNOSIS — R300 Dysuria: Secondary | ICD-10-CM | POA: Insufficient documentation

## 2012-01-25 DIAGNOSIS — R1110 Vomiting, unspecified: Secondary | ICD-10-CM | POA: Insufficient documentation

## 2012-01-25 DIAGNOSIS — E079 Disorder of thyroid, unspecified: Secondary | ICD-10-CM | POA: Insufficient documentation

## 2012-01-25 DIAGNOSIS — Z79899 Other long term (current) drug therapy: Secondary | ICD-10-CM | POA: Insufficient documentation

## 2012-01-25 DIAGNOSIS — E119 Type 2 diabetes mellitus without complications: Secondary | ICD-10-CM | POA: Insufficient documentation

## 2012-01-25 DIAGNOSIS — N289 Disorder of kidney and ureter, unspecified: Secondary | ICD-10-CM | POA: Insufficient documentation

## 2012-01-25 DIAGNOSIS — Z8669 Personal history of other diseases of the nervous system and sense organs: Secondary | ICD-10-CM | POA: Insufficient documentation

## 2012-01-25 DIAGNOSIS — J4489 Other specified chronic obstructive pulmonary disease: Secondary | ICD-10-CM | POA: Insufficient documentation

## 2012-01-25 DIAGNOSIS — M899 Disorder of bone, unspecified: Secondary | ICD-10-CM | POA: Insufficient documentation

## 2012-01-25 DIAGNOSIS — E871 Hypo-osmolality and hyponatremia: Secondary | ICD-10-CM | POA: Insufficient documentation

## 2012-01-25 LAB — URINALYSIS, ROUTINE W REFLEX MICROSCOPIC
Bilirubin Urine: NEGATIVE
Nitrite: NEGATIVE
Protein, ur: NEGATIVE mg/dL
Specific Gravity, Urine: 1.012 (ref 1.005–1.030)
Urobilinogen, UA: 1 mg/dL (ref 0.0–1.0)

## 2012-01-25 LAB — COMPREHENSIVE METABOLIC PANEL
Albumin: 4.1 g/dL (ref 3.5–5.2)
BUN: 16 mg/dL (ref 6–23)
Creatinine, Ser: 0.72 mg/dL (ref 0.50–1.10)
GFR calc Af Amer: 88 mL/min — ABNORMAL LOW (ref >90)
Total Bilirubin: 0.4 mg/dL (ref 0.3–1.2)
Total Protein: 7.3 g/dL (ref 6.0–8.3)

## 2012-01-25 LAB — TROPONIN I: Troponin I: <0.3 ng/mL (ref <0.30)

## 2012-01-25 LAB — AMYLASE: Amylase: 65 U/L (ref 0–105)

## 2012-01-25 LAB — CBC WITH DIFFERENTIAL/PLATELET
Basophils Relative: 0 % (ref 0–1)
Eosinophils Absolute: 0.1 10*3/uL (ref 0.0–0.7)
Eosinophils Relative: 1 % (ref 0–5)
HCT: 39.9 % (ref 36.0–46.0)
Hemoglobin: 13.9 g/dL (ref 12.0–15.0)
MCH: 29.8 pg (ref 26.0–34.0)
MCHC: 34.8 g/dL (ref 30.0–36.0)
MCV: 85.6 fL (ref 78.0–100.0)
Monocytes Absolute: 0.7 10*3/uL (ref 0.1–1.0)
Monocytes Relative: 6 % (ref 3–12)

## 2012-01-25 LAB — LIPASE, BLOOD: Lipase: 24 U/L (ref 11–59)

## 2012-01-25 MED ORDER — ONDANSETRON HCL 4 MG/2ML IJ SOLN
4.0000 mg | Freq: Once | INTRAMUSCULAR | Status: AC
  Administered 2012-01-25: 4 mg via INTRAVENOUS
  Filled 2012-01-25: qty 2

## 2012-01-25 MED ORDER — IOHEXOL 300 MG/ML  SOLN
100.0000 mL | Freq: Once | INTRAMUSCULAR | Status: AC | PRN
  Administered 2012-01-25: 100 mL via INTRAVENOUS

## 2012-01-25 MED ORDER — METOCLOPRAMIDE HCL 5 MG/ML IJ SOLN
10.0000 mg | Freq: Once | INTRAMUSCULAR | Status: AC
  Administered 2012-01-25: 10 mg via INTRAVENOUS
  Filled 2012-01-25: qty 2

## 2012-01-25 MED ORDER — SODIUM CHLORIDE 0.9 % IV SOLN
1000.0000 mL | INTRAVENOUS | Status: DC
  Administered 2012-01-25: 1000 mL via INTRAVENOUS

## 2012-01-25 NOTE — ED Provider Notes (Signed)
History     CSN: [id number]  Arrival date & time 01/25/12  1322   First MD Initiated Contact with Patient 01/25/12 1325      Chief Complaint  Patient presents with  . Nausea  . Emesis    (Consider location/radiation/quality/duration/timing/severity/associated sxs/prior treatment) HPI Comments: Patient brought to ER by ambulance for evaluation of nausea and vomiting. Symptoms began last night. She progress the worsening of the nausea and vomiting through the night. She has not had any diarrhea or constipation. Patient reports intermittent episodes of vomiting and does have a prescription for Zofran. She took a tablet one hour ago without improvement. Patient reports diffuse lower abdominal discomfort. She has not had any chest pain or shortness of breath. She does report that she has had burning with urination. She denies back pain.  Patient is a 78 y.o. female presenting with vomiting.  Emesis  Pertinent negatives include no fever.    Past Medical History  Diagnosis Date  . Coronary artery disease   . Hypertension   . Arthritis   . Diabetes mellitus   . Renal disorder   . Sleep apnea   . Thyroid disease   . Glaucoma(365)   . Osteopenia   . Elevated cholesterol   . Back pain   . PUD (peptic ulcer disease)   . Macular degeneration   . Typhoid fever   . COPD (chronic obstructive pulmonary disease)     Past Surgical History  Procedure Date  . Cholecystectomy   . Abdominal hysterectomy   . Cataract extraction   . Back surgery   . Thyroid surgery     History reviewed. No pertinent family history.  History  Substance Use Topics  . Smoking status: Never Smoker   . Smokeless tobacco: Not on file  . Alcohol Use: No    OB History    Grav Para Term Preterm Abortions TAB SAB Ect Mult Living                  Review of Systems  Constitutional: Negative for fever.  Respiratory: Positive for chest tightness. Negative for shortness of breath.   Cardiovascular:  Negative for chest pain.  Gastrointestinal: Positive for vomiting. Negative for abdominal distention.       Abdominal pain  Genitourinary: Positive for dysuria. Negative for hematuria.  All other systems reviewed and are negative.    Allergies  Codeine; Ezetimibe-simvastatin; Lipitor; Metformin and related; and Morphine and related  Home Medications   Current Outpatient Rx  Name  Route  Sig  Dispense  Refill  . ALBUTEROL SULFATE HFA 108 (90 BASE) MCG/ACT IN AERS   Inhalation   Inhale 2 puffs into the lungs every 4 (four) hours as needed. wheezing         . AMLODIPINE BESYLATE 5 MG PO TABS   Oral   Take 5 mg by mouth daily.         Marland Kitchen BIMATOPROST 0.03 % OP SOLN   Both Eyes   Place 1 drop into both eyes at bedtime.         Marland Kitchen BRIMONIDINE TARTRATE-TIMOLOL 0.2-0.5 % OP SOLN   Both Eyes   Place 1 drop into both eyes every 12 (twelve) hours.         Marland Kitchen CALCIUM CITRATE PO   Oral   Take 800 mg by mouth 2 (two) times daily. Pt takes 2 of 400 mg tablets for 800 MG dosage twice daily         .  VITAMIN D 1000 UNITS PO TABS   Oral   Take 2,000 Units by mouth daily. Pt takes 2 tabs for 2000 mg dose         . CYCLOBENZAPRINE HCL 5 MG PO TABS   Oral   Take 5 mg by mouth daily as needed. wheezing         . OMEGA-3 FATTY ACIDS 1000 MG PO CAPS   Oral   Take 2 capsules by mouth daily.         Marland Kitchen GABAPENTIN 300 MG PO CAPS   Oral   Take 600-900 mg by mouth See admin instructions. Pt takes 2 capsules in the morning and 2 capsules in the evening. 3 capsules at bedtime         . GLIPIZIDE 10 MG PO TABS   Oral   Take 10 mg by mouth every morning.         Marland Kitchen HYDROCODONE-ACETAMINOPHEN 10-325 MG PO TABS   Oral   Take 1 tablet by mouth every 6 (six) hours as needed. pain         . IRON PO   Oral   Take 27 mg by mouth daily.         Marland Kitchen GERITOL COMPLETE PO   Oral   Take 1 tablet by mouth daily.         Marland Kitchen LEVOTHYROXINE SODIUM 100 MCG PO TABS   Oral   Take 100  mcg by mouth every morning.         Marland Kitchen MECLIZINE HCL 25 MG PO TABS   Oral   Take 25 mg by mouth 3 (three) times daily as needed. dizziness         . METOPROLOL TARTRATE 25 MG PO TABS   Oral   Take 50 mg by mouth 2 (two) times daily.         Marland Kitchen PRESERVISION/LUTEIN PO   Oral   Take 1 tablet by mouth daily.         Marland Kitchen OMEPRAZOLE 20 MG PO CPDR   Oral   Take 20 mg by mouth daily.         Marland Kitchen PHENYLEPH-CPM-DM-APAP 06-09-08-325 MG PO CAPS   Oral   Take 2 tablets by mouth 4 (four) times daily as needed. Cold symptoms         . PRAVASTATIN SODIUM 40 MG PO TABS   Oral   Take 40 mg by mouth daily.         Marland Kitchen TEMAZEPAM 15 MG PO CAPS   Oral   Take 15 mg by mouth at bedtime as needed. Restlessness         . TIOTROPIUM BROMIDE MONOHYDRATE 18 MCG IN CAPS   Inhalation   Place 18 mcg into inhaler and inhale daily.         Marland Kitchen VALSARTAN-HYDROCHLOROTHIAZIDE 320-25 MG PO TABS   Oral   Take 1 tablet by mouth daily.         Marland Kitchen VITAMIN B-12 1000 MCG PO TABS   Oral   Take 2,000 mcg by mouth daily.           There were no vitals taken for this visit.  Physical Exam  Constitutional: She appears well-developed. She appears distressed.  HENT:  Head: Normocephalic and atraumatic.  Eyes: Pupils are equal, round, and reactive to light.  Neck: Normal range of motion. Neck supple.  Cardiovascular: Normal rate, regular rhythm and normal heart sounds.   Pulmonary/Chest: Effort normal and  breath sounds normal. No respiratory distress.  Abdominal: Soft. Bowel sounds are normal. She exhibits no distension and no mass. There is tenderness. There is no rebound and no guarding.       Tenderness without guarding or rebound LLQ, suprapubic and RLQ  Musculoskeletal: Normal range of motion.  Skin: Rash noted. She is diaphoretic.       Erythema and flushing of facial area with splotchy erythema on upper chest    ED Course  Procedures (including critical care time)  Labs Reviewed  CBC  WITH DIFFERENTIAL - Abnormal; Notable for the following:    WBC 10.7 (*)     Neutro Abs 8.0 (*)     All other components within normal limits  COMPREHENSIVE METABOLIC PANEL - Abnormal; Notable for the following:    Sodium 126 (*)     Potassium 3.3 (*)     Chloride 87 (*)     Glucose, Bld 144 (*)     GFR calc non Af Amer 76 (*)     GFR calc Af Amer 88 (*)     All other components within normal limits  URINALYSIS, ROUTINE W REFLEX MICROSCOPIC - Abnormal; Notable for the following:    Ketones, ur 15 (*)     All other components within normal limits  AMYLASE  LIPASE, BLOOD  LACTIC ACID, PLASMA  TROPONIN I   Ct Abdomen Pelvis W Contrast  01/25/2012  *RADIOLOGY REPORT*  Clinical Data: Lower abdominal pain.  Emesis.  Nausea, vomiting.  CT ABDOMEN AND PELVIS WITH CONTRAST  Technique:  Multidetector CT imaging of the abdomen and pelvis was performed following the standard protocol during bolus administration of intravenous contrast.  Contrast: OMNIPAQUE IOHEXOL 300 MG/ML  SOLN  Comparison: None.  Findings: There is calcification along the mitral annulus.  Small pericardial cyst measures 1.1 cm.  There is focal fatty infiltration along the falciform ligament.  No focal abnormality identified within the spleen, pancreas, adrenal glands, or left kidney.  Small right renal cyst is present.  Note is made of hiatal hernia.  The stomach, small bowel loops have a normal appearance. The appendix is well seen and has a normal appearance.  Numerous colonic diverticula are present.  No evidence for acute appendicitis or abscess.  No free intraperitoneal air. There is significant atherosclerotic calcification of the abdominal aorta.  No aneurysm.  The patient has had previous posterior fusion at L4-5.  IMPRESSION:  1. Mitral annulus calcification. 2.  Small pericardial cyst. 3.  No evidence for acute abnormality in the abdomen or pelvis. 4.  Diverticulosis.   Original Report Authenticated By: Norva Pavlov,  M.D.      No diagnosis found.    MDM   Patient presents to ER for evaluation of nausea and vomiting. Patient has had intermittent episodes of uncontrollable emesis in the past. She used her Zofran earlier without improvement. At arrival she was very uncomfortable. She also was noted to have an acute rash with erythema and flushing of the face and upper chest. This resolved spontaneously without any intervention. Etiology was unclear.  Patient was gently hydrated and given Reglan. Nausea and vomiting have improved. Urinalysis was negative. Blood reveals hyponatremia and mild hypokalemia. Reviewing her records, she has had low sodium and potassium in the past. She is asymptomatic. Patient has been gently hydrated here, we'll not recheck. She is to follow up with her doctor in the office for recheck and determine if there is any further workup necessary. Patient is to  return to the ER for worse vomiting, weakness or seizure.   Date: 01/25/2012  Rate: 66   Rhythm: normal sinus rhythm  QRS Axis: normal  Intervals: normal  ST/T Wave abnormalities: normal  Conduction Disutrbances:none  Narrative Interpretation:   Old EKG Reviewed: unchanged         Gilda Crease, MD 01/25/12 [phone]

## 2012-01-25 NOTE — ED Notes (Signed)
Patient transported to CT 

## 2012-01-25 NOTE — ED Notes (Signed)
Patient reports N/V since last night.  Patient denies diarrhea and fevers.  Patient has 4mg  Zofran at home that's she's taken with no improvement.

## 2012-01-25 NOTE — ED Notes (Signed)
MD at bedside. Dr. Pollina at bedside.  

## 2012-01-25 NOTE — ED Notes (Signed)
Unsuccessfully attempted to obtain blood for labs.RN made aware 

## 2012-09-07 ENCOUNTER — Encounter (HOSPITAL_COMMUNITY): Payer: Self-pay | Admitting: *Deleted

## 2012-09-07 ENCOUNTER — Emergency Department (HOSPITAL_COMMUNITY): Payer: Medicare Other

## 2012-09-07 ENCOUNTER — Emergency Department (HOSPITAL_COMMUNITY)
Admission: EM | Admit: 2012-09-07 | Discharge: 2012-09-07 | Disposition: A | Discharge status: Home / Self Care | Payer: Medicare Other | Attending: Emergency Medicine | Admitting: Emergency Medicine

## 2012-09-07 DIAGNOSIS — W19XXXA Unspecified fall, initial encounter: Secondary | ICD-10-CM

## 2012-09-07 DIAGNOSIS — IMO0002 Reserved for concepts with insufficient information to code with codable children: Secondary | ICD-10-CM | POA: Insufficient documentation

## 2012-09-07 DIAGNOSIS — M899 Disorder of bone, unspecified: Secondary | ICD-10-CM | POA: Insufficient documentation

## 2012-09-07 DIAGNOSIS — E119 Type 2 diabetes mellitus without complications: Secondary | ICD-10-CM | POA: Insufficient documentation

## 2012-09-07 DIAGNOSIS — R296 Repeated falls: Secondary | ICD-10-CM | POA: Insufficient documentation

## 2012-09-07 DIAGNOSIS — Z87448 Personal history of other diseases of urinary system: Secondary | ICD-10-CM | POA: Insufficient documentation

## 2012-09-07 DIAGNOSIS — G4730 Sleep apnea, unspecified: Secondary | ICD-10-CM | POA: Insufficient documentation

## 2012-09-07 DIAGNOSIS — I10 Essential (primary) hypertension: Secondary | ICD-10-CM | POA: Insufficient documentation

## 2012-09-07 DIAGNOSIS — H40009 Preglaucoma, unspecified, unspecified eye: Secondary | ICD-10-CM | POA: Insufficient documentation

## 2012-09-07 DIAGNOSIS — Z8619 Personal history of other infectious and parasitic diseases: Secondary | ICD-10-CM | POA: Insufficient documentation

## 2012-09-07 DIAGNOSIS — J4489 Other specified chronic obstructive pulmonary disease: Secondary | ICD-10-CM | POA: Insufficient documentation

## 2012-09-07 DIAGNOSIS — E079 Disorder of thyroid, unspecified: Secondary | ICD-10-CM | POA: Insufficient documentation

## 2012-09-07 DIAGNOSIS — J449 Chronic obstructive pulmonary disease, unspecified: Secondary | ICD-10-CM | POA: Insufficient documentation

## 2012-09-07 DIAGNOSIS — H3530 Unspecified macular degeneration: Secondary | ICD-10-CM | POA: Insufficient documentation

## 2012-09-07 DIAGNOSIS — Z79899 Other long term (current) drug therapy: Secondary | ICD-10-CM | POA: Insufficient documentation

## 2012-09-07 DIAGNOSIS — M129 Arthropathy, unspecified: Secondary | ICD-10-CM | POA: Insufficient documentation

## 2012-09-07 DIAGNOSIS — Y929 Unspecified place or not applicable: Secondary | ICD-10-CM | POA: Insufficient documentation

## 2012-09-07 DIAGNOSIS — Z8711 Personal history of peptic ulcer disease: Secondary | ICD-10-CM | POA: Insufficient documentation

## 2012-09-07 DIAGNOSIS — M545 Low back pain: Secondary | ICD-10-CM

## 2012-09-07 DIAGNOSIS — I2510 Atherosclerotic heart disease of native coronary artery without angina pectoris: Secondary | ICD-10-CM | POA: Insufficient documentation

## 2012-09-07 DIAGNOSIS — Y9389 Activity, other specified: Secondary | ICD-10-CM | POA: Insufficient documentation

## 2012-09-07 DIAGNOSIS — E7800 Pure hypercholesterolemia, unspecified: Secondary | ICD-10-CM | POA: Insufficient documentation

## 2012-09-07 MED ORDER — FENTANYL CITRATE 0.05 MG/ML IJ SOLN
25.0000 ug | INTRAMUSCULAR | Status: DC | PRN
  Filled 2012-09-07: qty 2

## 2012-09-07 MED ORDER — ONDANSETRON HCL 4 MG/2ML IJ SOLN
4.0000 mg | Freq: Once | INTRAMUSCULAR | Status: DC
  Filled 2012-09-07: qty 2

## 2012-09-07 MED ORDER — HYDROCODONE-ACETAMINOPHEN 5-325 MG PO TABS: 1.0000 | ORAL_TABLET | Freq: Four times a day (QID) | ORAL | Status: DC | PRN

## 2012-09-07 MED ORDER — ONDANSETRON 4 MG PO TBDP
4.0000 mg | ORAL_TABLET | Freq: Once | ORAL | Status: AC
  Administered 2012-09-07: 4 mg via ORAL
  Filled 2012-09-07: qty 1

## 2012-09-07 MED ORDER — FENTANYL CITRATE 0.05 MG/ML IJ SOLN
50.0000 ug | INTRAMUSCULAR | Status: DC | PRN
  Administered 2012-09-07: 50 ug via INTRAMUSCULAR
  Filled 2012-09-07: qty 2

## 2012-09-07 NOTE — ED Notes (Signed)
Pt ambulated with staff, pt has a steady gait.

## 2012-09-07 NOTE — ED Notes (Signed)
2 IV attempts unsuccessful. 

## 2012-09-07 NOTE — ED Notes (Signed)
Pt to ER via EMS s/p fall; pt states that she was walking to the [place] and lost footing and fell; pt states that she has a hx of chronic lower back pain but pain is worse tonight after fall

## 2012-09-07 NOTE — ED Provider Notes (Signed)
CSN: [id number]     Arrival date & time 09/07/12  0006 History     First MD Initiated Contact with Patient 09/07/12 0158     Chief Complaint  Patient presents with  . Fall  . Back Pain   (Consider location/radiation/quality/duration/timing/severity/associated sxs/prior Treatment) HPI History provided by patient and family bedside. She lives alone has back pain and lumbar fusuion surgery. Tonight was getting ready for bed and lost her footing and fell backwards injuring her lower back. She landing on her buttocks with severe short pain lower lumbar does not lateralize. No radiation of pain. No associated weakness or numbness to her extremities. No incontinence of bowel or bladder. No recent chest pain, shortness of breath, abdominal pain, vomiting, syncope or near-syncope otherwise Past Medical History  Diagnosis Date  . Coronary artery disease   . Hypertension   . Arthritis   . Diabetes mellitus   . Renal disorder   . Sleep apnea   . Thyroid disease   . Glaucoma   . Osteopenia   . Elevated cholesterol   . Back pain   . PUD (peptic ulcer disease)   . Macular degeneration   . Typhoid fever   . COPD (chronic obstructive pulmonary disease)    Past Surgical History  Procedure Laterality Date  . Cholecystectomy    . Abdominal hysterectomy    . Cataract extraction    . Back surgery    . Thyroid surgery     No family history on file. History  Substance Use Topics  . Smoking status: Never Smoker   . Smokeless tobacco: Not on file  . Alcohol Use: No   OB History   Grav Para Term Preterm Abortions TAB SAB Ect Mult Living                 Review of Systems  Constitutional: Negative for fever and chills.  HENT: Negative for neck pain and neck stiffness.   Eyes: Negative for pain.  Respiratory: Negative for shortness of breath.   Cardiovascular: Negative for chest pain.  Gastrointestinal: Negative for abdominal pain.  Genitourinary: Negative for dysuria and hematuria.   Musculoskeletal: Positive for back pain.  Skin: Negative for rash.  Neurological: Negative for headaches.  All other systems reviewed and are negative.    Allergies  Ezetimibe-simvastatin; Lipitor; Metformin and related; Morphine and related; and Codeine  Home Medications   Current Outpatient Rx  Name  Route  Sig  Dispense  Refill  . albuterol (PROVENTIL HFA;VENTOLIN HFA) 108 (90 BASE) MCG/ACT inhaler   Inhalation   Inhale 2 puffs into the lungs every 4 (four) hours as needed. wheezing         . bimatoprost (LUMIGAN) 0.03 % ophthalmic solution   Both Eyes   Place 1 drop into both eyes at bedtime.         . brimonidine-timolol (COMBIGAN) 0.2-0.5 % ophthalmic solution   Both Eyes   Place 1 drop into both eyes every 12 (twelve) hours.         . cholecalciferol (VITAMIN D) 1000 UNITS tablet   Oral   Take 2,000 Units by mouth daily.          . fish oil-omega-3 fatty acids 1000 MG capsule   Oral   Take 2 g by mouth daily.          Marland Kitchen gabapentin (NEURONTIN) 300 MG capsule   Oral   Take 600-900 mg by mouth 3 (three) times daily. Pt takes 2 capsules  in the morning and 2 capsules in the evening. 3 capsules at bedtime         . glipiZIDE (GLUCOTROL) 10 MG tablet   Oral   Take 10 mg by mouth daily after breakfast.          . levothyroxine (SYNTHROID, LEVOTHROID) 100 MCG tablet   Oral   Take 100 mcg by mouth daily with breakfast.          . meclizine (ANTIVERT) 25 MG tablet   Oral   Take 25 mg by mouth 3 (three) times daily as needed. dizziness         . Multiple Vitamins-Minerals (PRESERVISION/LUTEIN PO)   Oral   Take 1 tablet by mouth daily.         . ondansetron (ZOFRAN) 4 MG tablet   Oral   Take 4 mg by mouth every 8 (eight) hours as needed for nausea.         . pravastatin (PRAVACHOL) 40 MG tablet   Oral   Take 40 mg by mouth daily.         Marland Kitchen tiotropium (SPIRIVA) 18 MCG inhalation capsule   Inhalation   Place 18 mcg into inhaler and  inhale daily.         . vitamin B-12 (CYANOCOBALAMIN) 1000 MCG tablet   Oral   Take 2,000 mcg by mouth daily.          BP 174/75  Pulse 71  Temp(Src) 98.7 F (37.1 C) (Oral)  Resp 10  SpO2 95% Physical Exam  Constitutional: She is oriented to person, place, and time. She appears well-developed and well-nourished.  HENT:  Head: Normocephalic and atraumatic.  Eyes: EOM are normal. Pupils are equal, round, and reactive to light.  Neck: Neck supple.  Cardiovascular: Normal rate, regular rhythm and intact distal pulses.   Pulmonary/Chest: Effort normal and breath sounds normal. No respiratory distress. She exhibits no tenderness.  Abdominal: Soft. She exhibits no distension. There is no tenderness.  Musculoskeletal: Normal range of motion. She exhibits no edema.  Tender over lower lumbar spine midline without obvious normally. No lower extremity deficits with equal dorsi plantar flexion, knee extension, hip flexion. Sensorium to light touch equal and intact throughout lower extremities  Neurological: She is alert and oriented to person, place, and time. No cranial nerve deficit.  Skin: Skin is warm and dry.    ED Course   Procedures (including critical care time)  Labs Reviewed - No data to display Dg Lumbar Spine Complete  09/07/2012   *RADIOLOGY REPORT*  Clinical Data: Fall.  Back pain.  LUMBAR SPINE - COMPLETE 4+ VIEW  Comparison: CT 01/25/2012.  Findings: There is a mild dextroconvex thoracolumbar curvature with the apex at T12-L1.  L4-L5 PLIF with L4-L5 discectomy.  The L4-L5 fusion appears solid.  Vertebral body height is preserved. Multilevel lumbar spondylosis is present, most pronounced at L2-L3 and L3-L4.  There is grade 1 retrolisthesis of L3 on L4 which is probably degenerative and associated with adjacent segment disease.  Aortoiliac atherosclerosis is present.  Lumbosacral junction appears within normal limits aside from facet arthrosis.  There is no hardware  complication.  IMPRESSION: 1.  L4-L5 solid fusion status post PLIF. 2. No acute osseous abnormality. 3.  Multilevel degenerative disease with chronic degenerative retrolisthesis of L3 on L4.   Original Report Authenticated By: Andreas Newport, M.D.   IM fentanyl and PO Zofran provided  3:28 AM on recheck, pain improving. Able to ambulate in the hall  with minimal assistance. Daughter bedside is comfortable staying with patient and taking her home tonight. She has taken Vicodin as recently as a year ago and I will provide prescription for the same tonight. Plan followup with primary care physician for recheck and further evaluation. Return precautions verbalized as understood  MDM   Low back pain, contusion with fall tonight at home. No other trauma or injury.  Evaluated with x-rays reviewed as above, no acute fractures identified  IM narcotics provided  Vital signs nurse's notes reviewed and considered  Sunnie Nielsen, MD 09/07/12 [phone]

## 2012-09-07 NOTE — ED Notes (Signed)
Patient transported to X-ray 

## 2012-09-07 NOTE — ED Notes (Signed)
[id number]<GN> Expected date:<BR> Expected time:<BR> Means of arrival:<BR> Comments:<BR> EMS 79yo F; fall; c/o lower back pain with hx of chronic lower back pain

## 2012-09-15 ENCOUNTER — Other Ambulatory Visit: Payer: Self-pay | Admitting: Family Medicine

## 2012-09-15 DIAGNOSIS — R1310 Dysphagia, unspecified: Secondary | ICD-10-CM

## 2012-09-25 ENCOUNTER — Ambulatory Visit
Admission: RE | Admit: 2012-09-25 | Discharge: 2012-09-25 | Disposition: A | Discharge status: Home / Self Care | Payer: Medicare Other | Source: Ambulatory Visit | Attending: Family Medicine | Admitting: Family Medicine

## 2012-09-25 DIAGNOSIS — R1310 Dysphagia, unspecified: Secondary | ICD-10-CM

## 2012-09-26 ENCOUNTER — Ambulatory Visit: Payer: Medicare Other | Attending: Family Medicine | Admitting: Physical Therapy

## 2012-09-26 DIAGNOSIS — IMO0001 Reserved for inherently not codable concepts without codable children: Secondary | ICD-10-CM | POA: Insufficient documentation

## 2012-09-26 DIAGNOSIS — R5381 Other malaise: Secondary | ICD-10-CM | POA: Insufficient documentation

## 2012-09-28 ENCOUNTER — Ambulatory Visit: Payer: Medicare Other | Admitting: Physical Therapy

## 2012-10-02 ENCOUNTER — Ambulatory Visit: Payer: Medicare Other | Admitting: Physical Therapy

## 2012-10-04 ENCOUNTER — Ambulatory Visit: Payer: Medicare Other | Admitting: Physical Therapy

## 2012-10-10 ENCOUNTER — Encounter: Payer: Medicare Other | Admitting: Physical Therapy

## 2012-10-12 ENCOUNTER — Ambulatory Visit: Payer: Medicare Other | Admitting: Physical Therapy

## 2012-10-16 ENCOUNTER — Encounter: Payer: Medicare Other | Admitting: Physical Therapy

## 2012-10-18 ENCOUNTER — Ambulatory Visit: Payer: Medicare Other | Attending: Family Medicine | Admitting: Physical Therapy

## 2012-10-18 DIAGNOSIS — R5381 Other malaise: Secondary | ICD-10-CM | POA: Diagnosis not present

## 2012-10-18 DIAGNOSIS — IMO0001 Reserved for inherently not codable concepts without codable children: Secondary | ICD-10-CM | POA: Diagnosis present

## 2012-10-23 ENCOUNTER — Encounter: Payer: Medicare Other | Admitting: Physical Therapy

## 2012-10-25 ENCOUNTER — Ambulatory Visit: Payer: Medicare Other | Admitting: Physical Therapy

## 2012-10-25 DIAGNOSIS — IMO0001 Reserved for inherently not codable concepts without codable children: Secondary | ICD-10-CM | POA: Diagnosis not present

## 2012-10-30 ENCOUNTER — Encounter: Payer: Medicare Other | Admitting: Physical Therapy

## 2012-11-01 ENCOUNTER — Ambulatory Visit: Payer: Medicare Other | Admitting: Physical Therapy

## 2012-11-01 DIAGNOSIS — IMO0001 Reserved for inherently not codable concepts without codable children: Secondary | ICD-10-CM | POA: Diagnosis not present

## 2012-11-06 ENCOUNTER — Encounter: Payer: Medicare Other | Admitting: Physical Therapy

## 2012-11-08 ENCOUNTER — Ambulatory Visit: Payer: Medicare Other

## 2013-02-15 ENCOUNTER — Telehealth: Payer: Self-pay | Admitting: *Deleted

## 2013-02-15 NOTE — Telephone Encounter (Signed)
Ms. Lori Long request information diabetic shoes for pt.  I left message stating pt would need to be evaluated by her podiatrist, he would fill out paperwork that would be sent to her primary doctor and then on to [place] that manages the insurance, shoe orders.  I told her to call again if she needed more information.

## 2013-03-16 ENCOUNTER — Encounter (HOSPITAL_COMMUNITY): Payer: Self-pay | Admitting: Emergency Medicine

## 2013-03-16 ENCOUNTER — Emergency Department (HOSPITAL_COMMUNITY)
Admission: EM | Admit: 2013-03-16 | Discharge: 2013-03-16 | Disposition: A | Discharge status: Home / Self Care | Payer: Medicare Other | Attending: Emergency Medicine | Admitting: Emergency Medicine

## 2013-03-16 ENCOUNTER — Emergency Department (HOSPITAL_COMMUNITY): Payer: Medicare Other

## 2013-03-16 DIAGNOSIS — R21 Rash and other nonspecific skin eruption: Secondary | ICD-10-CM | POA: Insufficient documentation

## 2013-03-16 DIAGNOSIS — K5289 Other specified noninfective gastroenteritis and colitis: Secondary | ICD-10-CM | POA: Insufficient documentation

## 2013-03-16 DIAGNOSIS — K529 Noninfective gastroenteritis and colitis, unspecified: Secondary | ICD-10-CM

## 2013-03-16 DIAGNOSIS — R1084 Generalized abdominal pain: Secondary | ICD-10-CM | POA: Insufficient documentation

## 2013-03-16 DIAGNOSIS — Z8619 Personal history of other infectious and parasitic diseases: Secondary | ICD-10-CM | POA: Insufficient documentation

## 2013-03-16 DIAGNOSIS — J449 Chronic obstructive pulmonary disease, unspecified: Secondary | ICD-10-CM | POA: Insufficient documentation

## 2013-03-16 DIAGNOSIS — R109 Unspecified abdominal pain: Secondary | ICD-10-CM

## 2013-03-16 DIAGNOSIS — E7800 Pure hypercholesterolemia, unspecified: Secondary | ICD-10-CM | POA: Insufficient documentation

## 2013-03-16 DIAGNOSIS — J4489 Other specified chronic obstructive pulmonary disease: Secondary | ICD-10-CM | POA: Insufficient documentation

## 2013-03-16 DIAGNOSIS — F0390 Unspecified dementia without behavioral disturbance: Secondary | ICD-10-CM | POA: Insufficient documentation

## 2013-03-16 DIAGNOSIS — Z87448 Personal history of other diseases of urinary system: Secondary | ICD-10-CM | POA: Insufficient documentation

## 2013-03-16 DIAGNOSIS — E119 Type 2 diabetes mellitus without complications: Secondary | ICD-10-CM | POA: Insufficient documentation

## 2013-03-16 DIAGNOSIS — Z79899 Other long term (current) drug therapy: Secondary | ICD-10-CM | POA: Insufficient documentation

## 2013-03-16 DIAGNOSIS — I10 Essential (primary) hypertension: Secondary | ICD-10-CM | POA: Insufficient documentation

## 2013-03-16 DIAGNOSIS — I2510 Atherosclerotic heart disease of native coronary artery without angina pectoris: Secondary | ICD-10-CM | POA: Insufficient documentation

## 2013-03-16 DIAGNOSIS — Z8711 Personal history of peptic ulcer disease: Secondary | ICD-10-CM | POA: Insufficient documentation

## 2013-03-16 DIAGNOSIS — E079 Disorder of thyroid, unspecified: Secondary | ICD-10-CM | POA: Insufficient documentation

## 2013-03-16 DIAGNOSIS — H409 Unspecified glaucoma: Secondary | ICD-10-CM | POA: Insufficient documentation

## 2013-03-16 DIAGNOSIS — IMO0002 Reserved for concepts with insufficient information to code with codable children: Secondary | ICD-10-CM | POA: Insufficient documentation

## 2013-03-16 HISTORY — DX: Unspecified dementia, unspecified severity, without behavioral disturbance, psychotic disturbance, mood disturbance, and anxiety: F03.90

## 2013-03-16 LAB — LIPASE, BLOOD: Lipase: 19 U/L (ref 11–59)

## 2013-03-16 LAB — URINALYSIS, ROUTINE W REFLEX MICROSCOPIC
BILIRUBIN URINE: NEGATIVE
Glucose, UA: 100 mg/dL — AB
HGB URINE DIPSTICK: NEGATIVE
KETONES UR: 40 mg/dL — AB
Leukocytes, UA: NEGATIVE
Nitrite: NEGATIVE
PROTEIN: 30 mg/dL — AB
Specific Gravity, Urine: 1.017 (ref 1.005–1.030)
UROBILINOGEN UA: 0.2 mg/dL (ref 0.0–1.0)
pH: 7.5 (ref 5.0–8.0)

## 2013-03-16 LAB — COMPREHENSIVE METABOLIC PANEL
ALK PHOS: 71 U/L (ref 39–117)
ALT: 13 U/L (ref 0–35)
AST: 19 U/L (ref 0–37)
Albumin: 4.1 g/dL (ref 3.5–5.2)
BILIRUBIN TOTAL: 0.5 mg/dL (ref 0.3–1.2)
BUN: 20 mg/dL (ref 6–23)
CO2: 23 meq/L (ref 19–32)
Calcium: 9.3 mg/dL (ref 8.4–10.5)
Chloride: 95 mEq/L — ABNORMAL LOW (ref 96–112)
Creatinine, Ser: 0.68 mg/dL (ref 0.50–1.10)
GFR, EST AFRICAN AMERICAN: 89 mL/min — AB (ref >90)
GFR, EST NON AFRICAN AMERICAN: 76 mL/min — AB (ref >90)
GLUCOSE: 174 mg/dL — AB (ref 70–99)
POTASSIUM: 4 meq/L (ref 3.7–5.3)
SODIUM: 135 meq/L — AB (ref 137–147)
Total Protein: 7.3 g/dL (ref 6.0–8.3)

## 2013-03-16 LAB — CBC WITH DIFFERENTIAL/PLATELET
Basophils Absolute: 0 10*3/uL (ref 0.0–0.1)
Basophils Relative: 0 % (ref 0–1)
Eosinophils Absolute: 0 10*3/uL (ref 0.0–0.7)
Eosinophils Relative: 0 % (ref 0–5)
HCT: 38.9 % (ref 36.0–46.0)
HEMOGLOBIN: 13.2 g/dL (ref 12.0–15.0)
LYMPHS ABS: 2.4 10*3/uL (ref 0.7–4.0)
LYMPHS PCT: 23 % (ref 12–46)
MCH: 29.5 pg (ref 26.0–34.0)
MCHC: 33.9 g/dL (ref 30.0–36.0)
MCV: 86.8 fL (ref 78.0–100.0)
Monocytes Absolute: 0.7 10*3/uL (ref 0.1–1.0)
Monocytes Relative: 7 % (ref 3–12)
NEUTROS PCT: 69 % (ref 43–77)
Neutro Abs: 7.1 10*3/uL (ref 1.7–7.7)
PLATELETS: 250 10*3/uL (ref 150–400)
RBC: 4.48 MIL/uL (ref 3.87–5.11)
RDW: 14.6 % (ref 11.5–15.5)
WBC: 10.3 10*3/uL (ref 4.0–10.5)

## 2013-03-16 LAB — URINE MICROSCOPIC-ADD ON

## 2013-03-16 MED ORDER — IOHEXOL 300 MG/ML  SOLN
100.0000 mL | Freq: Once | INTRAMUSCULAR | Status: AC | PRN
  Administered 2013-03-16: 100 mL via INTRAVENOUS

## 2013-03-16 MED ORDER — METRONIDAZOLE 500 MG PO TABS: 500.0000 mg | ORAL_TABLET | Freq: Two times a day (BID) | ORAL | Status: DC

## 2013-03-16 MED ORDER — CIPROFLOXACIN HCL 500 MG PO TABS: 500.0000 mg | ORAL_TABLET | Freq: Two times a day (BID) | ORAL | Status: DC

## 2013-03-16 MED ORDER — HYDROCODONE-ACETAMINOPHEN 5-325 MG PO TABS: 1.0000 | ORAL_TABLET | ORAL | Status: DC | PRN

## 2013-03-16 MED ORDER — ONDANSETRON HCL 4 MG PO TABS: 4.0000 mg | ORAL_TABLET | Freq: Four times a day (QID) | ORAL | Status: DC

## 2013-03-16 MED ORDER — IOHEXOL 300 MG/ML  SOLN
50.0000 mL | Freq: Once | INTRAMUSCULAR | Status: AC | PRN
  Administered 2013-03-16: 50 mL via ORAL

## 2013-03-16 MED ORDER — FENTANYL CITRATE 0.05 MG/ML IJ SOLN
25.0000 ug | Freq: Once | INTRAMUSCULAR | Status: AC
  Administered 2013-03-16: 25 ug via INTRAVENOUS
  Filled 2013-03-16: qty 2

## 2013-03-16 MED ORDER — METOCLOPRAMIDE HCL 5 MG/ML IJ SOLN
5.0000 mg | Freq: Once | INTRAMUSCULAR | Status: AC
  Administered 2013-03-16: 5 mg via INTRAVENOUS
  Filled 2013-03-16: qty 2

## 2013-03-16 MED ORDER — SODIUM CHLORIDE 0.9 % IV SOLN
Freq: Once | INTRAVENOUS | Status: AC
  Administered 2013-03-16: 100 mL/h via INTRAVENOUS

## 2013-03-16 NOTE — ED Notes (Signed)
Pt from home and has not been feeling well, chills bodyache, n/v for the past 2 days.

## 2013-03-16 NOTE — ED Provider Notes (Signed)
Pt here with a caregiver who reports patient has been having nausea and vomiting for the past 3 days. There has been no diarrhea Patient is denying pain but she is obviously in distress. Her prior surgeries include BTL and cholecystectomy. Patient is tearful. Her face is very red. On abdominal exam patient is tender in the left upper quadrant. There is no guarding or rebound. I discussed need for CT with family member and she is agreeable.  Medical screening examination/treatment/procedure(s) were conducted as a shared visit with non-physician practitioner(s) and myself.  I personally evaluated the patient during the encounter.  EKG Interpretation   None        Rolland Porter, MD, Abram Sander   Janice Norrie, MD 03/16/13 1153

## 2013-03-16 NOTE — Discharge Instructions (Signed)
Abdominal Pain, Adult °Many things can cause abdominal pain. Usually, abdominal pain is not caused by a disease and will improve without treatment. It can often be observed and treated at home. Your health care provider will do a physical exam and possibly order blood tests and X-rays to help determine the seriousness of your pain. However, in many cases, more time must pass before a clear cause of the pain can be found. Before that point, your health care provider may not know if you need more testing or further treatment. °HOME CARE INSTRUCTIONS  °Monitor your abdominal pain for any changes. The following actions may help to alleviate any discomfort you are experiencing: °· Only take over-the-counter or prescription medicines as directed by your health care provider. °· Do not take laxatives unless directed to do so by your health care provider. °· Try a clear liquid diet (broth, tea, or water) as directed by your health care provider. Slowly move to a bland diet as tolerated. °SEEK MEDICAL CARE IF: °· You have unexplained abdominal pain. °· You have abdominal pain associated with nausea or diarrhea. °· You have pain when you urinate or have a bowel movement. °· You experience abdominal pain that wakes you in the night. °· You have abdominal pain that is worsened or improved by eating food. °· You have abdominal pain that is worsened with eating fatty foods. °SEEK IMMEDIATE MEDICAL CARE IF:  °· Your pain does not go away within 2 hours. °· You have a fever. °· You keep throwing up (vomiting). °· Your pain is felt only in portions of the abdomen, such as the right side or the left lower portion of the abdomen. °· You pass bloody or black tarry stools. °MAKE SURE YOU: °· Understand these instructions.   °· Will watch your condition.   °· Will get help right away if you are not doing well or get worse.   °Document Released: 11/04/2004 Document Revised: 11/15/2012 Document Reviewed: 10/04/2012 °[employer]® Patient  Information ©2014 [employer]. ° °

## 2013-03-16 NOTE — ED Provider Notes (Signed)
See prior note   Lori Norrie, MD 03/16/13 1506

## 2013-03-16 NOTE — ED Notes (Signed)
Bed: WA17 Expected date:  Expected time:  Means of arrival:  Comments: EMS-Flu like symptoms

## 2013-03-16 NOTE — ED Notes (Signed)
Bed: YJ85 Expected date:  Expected time:  Means of arrival:  Comments: EMS-weak/ams

## 2013-03-16 NOTE — ED Notes (Signed)
GrandDaughter called which is the POA  and was concerned due to she has been staying with pt at home. Pt has been taking to much hydrocodone and not taking bp meds. She states that she needs to have medical clear ence evaluation. Pt has been having paranoid behaviors and has been dry heaving not having emesis. Family said that it is getting bad due to pt is not sleeping up pacing the floors. Family had to take keys to car due to she was driving and not in control. Arlys John [phone] could you please call her.

## 2013-03-16 NOTE — ED Provider Notes (Signed)
CSN: [id number]     Arrival date & time 03/16/13  1007 History   First MD Initiated Contact with Patient 03/16/13 1013     Chief Complaint  Patient presents with  . Nausea  . Emesis   (Consider location/radiation/quality/duration/timing/severity/associated sxs/prior Treatment) Patient is a 80 y.o. female presenting with vomiting. The history is provided by the patient and a caregiver.  Emesis Severity:  Moderate Duration:  3 days Associated symptoms: abdominal pain   Associated symptoms: no chills and no diarrhea   Associated symptoms comment:  Caregiver at bedside reports vomiting for the past 3 days and complaint of episodic abdominal pain. No diarrhea or known fever. No bloody emesis. Caregiver reports she is at her baseline dementia without acute mental status changes.    Past Medical History  Diagnosis Date  . Coronary artery disease   . Hypertension   . Arthritis   . Diabetes mellitus   . Renal disorder   . Sleep apnea   . Thyroid disease   . Glaucoma   . Osteopenia   . Elevated cholesterol   . Back pain   . PUD (peptic ulcer disease)   . Macular degeneration   . Typhoid fever   . COPD (chronic obstructive pulmonary disease)   . Dementia    Past Surgical History  Procedure Laterality Date  . Cholecystectomy    . Abdominal hysterectomy    . Cataract extraction    . Back surgery    . Thyroid surgery     No family history on file. History  Substance Use Topics  . Smoking status: Never Smoker   . Smokeless tobacco: Not on file  . Alcohol Use: No   OB History   Grav Para Term Preterm Abortions TAB SAB Ect Mult Living                 Review of Systems  Constitutional: Negative for fever and chills.  Respiratory: Negative.  Negative for cough.   Cardiovascular: Negative.  Negative for chest pain.  Gastrointestinal: Positive for nausea, vomiting and abdominal pain. Negative for diarrhea.  Genitourinary: Negative.  Negative for dysuria.  Musculoskeletal:  Negative.   Skin: Negative.   Neurological: Negative.   Psychiatric/Behavioral: Negative for confusion.    Allergies  Ezetimibe-simvastatin; Lipitor; Metformin and related; Morphine and related; and Codeine  Home Medications   Current Outpatient Rx  Name  Route  Sig  Dispense  Refill  . albuterol (PROVENTIL HFA;VENTOLIN HFA) 108 (90 BASE) MCG/ACT inhaler   Inhalation   Inhale 2 puffs into the lungs every 4 (four) hours as needed for wheezing. wheezing         . amLODipine (NORVASC) 5 MG tablet   Oral   Take 5 mg by mouth every morning.         . bimatoprost (LUMIGAN) 0.03 % ophthalmic solution   Both Eyes   Place 1 drop into both eyes at bedtime.         . brimonidine-timolol (COMBIGAN) 0.2-0.5 % ophthalmic solution   Both Eyes   Place 1 drop into both eyes every 12 (twelve) hours.         . cholecalciferol (VITAMIN D) 1000 UNITS tablet   Oral   Take 2,000 Units by mouth every morning.          . fish oil-omega-3 fatty acids 1000 MG capsule   Oral   Take 2 g by mouth every morning.          Marland Kitchen  gabapentin (NEURONTIN) 300 MG capsule   Oral   Take 600-900 mg by mouth 3 (three) times daily. Pt takes 2 capsules in the morning and 2 capsules in the evening. 3 capsules at bedtime         . glipiZIDE (GLUCOTROL) 10 MG tablet   Oral   Take 10 mg by mouth daily after breakfast.          . levothyroxine (SYNTHROID, LEVOTHROID) 100 MCG tablet   Oral   Take 100 mcg by mouth daily with breakfast.          . loteprednol (LOTEMAX) 0.5 % ophthalmic suspension   Left Eye   Place 1 drop into the left eye 3 (three) times daily.         . metoprolol tartrate (LOPRESSOR) 25 MG tablet   Oral   Take 50 mg by mouth 2 (two) times daily.          . Multiple Vitamins-Minerals (PRESERVISION/LUTEIN PO)   Oral   Take 1 tablet by mouth every morning.          . pravastatin (PRAVACHOL) 40 MG tablet   Oral   Take 40 mg by mouth every evening.          .  valsartan (DIOVAN) 320 MG tablet   Oral   Take 1 tablet by mouth every morning.         . vitamin B-12 (CYANOCOBALAMIN) 1000 MCG tablet   Oral   Take 2,500 mcg by mouth every morning.           BP 181/77  Pulse 106  Temp(Src) 97.8 F (36.6 C) (Oral)  Resp 24  SpO2 100% Physical Exam  Constitutional: She appears well-developed and well-nourished.  HENT:  Head: Normocephalic.  Eyes: Conjunctivae are normal.  Neck: Normal range of motion. Neck supple.  Cardiovascular: Normal rate and regular rhythm.   No murmur heard. Pulmonary/Chest: Effort normal and breath sounds normal.  Abdominal: Soft. Bowel sounds are normal. There is tenderness. There is no rebound and no guarding.  Diffuse abdominal tenderness.   Musculoskeletal: Normal range of motion.  Neurological: She is alert.  Skin: Skin is warm and dry.  Petechial rash to face c/w change found after vomiting.  Psychiatric: She has a normal mood and affect.    ED Course  Procedures (including critical care time) Labs Review Labs Reviewed  URINE CULTURE  CBC WITH DIFFERENTIAL  COMPREHENSIVE METABOLIC PANEL  LIPASE, BLOOD  URINALYSIS, ROUTINE W REFLEX MICROSCOPIC   Results for orders placed during the hospital encounter of 03/16/13  CBC WITH DIFFERENTIAL      Result Value Range   WBC 10.3  4.0 - 10.5 K/uL   RBC 4.48  3.87 - 5.11 MIL/uL   Hemoglobin 13.2  12.0 - 15.0 g/dL   HCT 38.9  36.0 - 46.0 %   MCV 86.8  78.0 - 100.0 fL   MCH 29.5  26.0 - 34.0 pg   MCHC 33.9  30.0 - 36.0 g/dL   RDW 14.6  11.5 - 15.5 %   Platelets 250  150 - 400 K/uL   Neutrophils Relative % 69  43 - 77 %   Neutro Abs 7.1  1.7 - 7.7 K/uL   Lymphocytes Relative 23  12 - 46 %   Lymphs Abs 2.4  0.7 - 4.0 K/uL   Monocytes Relative 7  3 - 12 %   Monocytes Absolute 0.7  0.1 - 1.0 K/uL   Eosinophils Relative  0  0 - 5 %   Eosinophils Absolute 0.0  0.0 - 0.7 K/uL   Basophils Relative 0  0 - 1 %   Basophils Absolute 0.0  0.0 - 0.1 K/uL   COMPREHENSIVE METABOLIC PANEL      Result Value Range   Sodium 135 (*) 137 - 147 mEq/L   Potassium 4.0  3.7 - 5.3 mEq/L   Chloride 95 (*) 96 - 112 mEq/L   CO2 23  19 - 32 mEq/L   Glucose, Bld 174 (*) 70 - 99 mg/dL   BUN 20  6 - 23 mg/dL   Creatinine, Ser 0.68  0.50 - 1.10 mg/dL   Calcium 9.3  8.4 - 10.5 mg/dL   Total Protein 7.3  6.0 - 8.3 g/dL   Albumin 4.1  3.5 - 5.2 g/dL   AST 19  0 - 37 U/L   ALT 13  0 - 35 U/L   Alkaline Phosphatase 71  39 - 117 U/L   Total Bilirubin 0.5  0.3 - 1.2 mg/dL   GFR calc non Af Amer 76 (*) >90 mL/min   GFR calc Af Amer 89 (*) >90 mL/min  LIPASE, BLOOD      Result Value Range   Lipase 19  11 - 59 U/L  URINALYSIS, ROUTINE W REFLEX MICROSCOPIC      Result Value Range   Color, Urine YELLOW  YELLOW   APPearance CLEAR  CLEAR   Specific Gravity, Urine 1.017  1.005 - 1.030   pH 7.5  5.0 - 8.0   Glucose, UA 100 (*) NEGATIVE mg/dL   Hgb urine dipstick NEGATIVE  NEGATIVE   Bilirubin Urine NEGATIVE  NEGATIVE   Ketones, ur 40 (*) NEGATIVE mg/dL   Protein, ur 30 (*) NEGATIVE mg/dL   Urobilinogen, UA 0.2  0.0 - 1.0 mg/dL   Nitrite NEGATIVE  NEGATIVE   Leukocytes, UA NEGATIVE  NEGATIVE  URINE MICROSCOPIC-ADD ON      Result Value Range   WBC, UA 0-2  <3 WBC/hpf   RBC / HPF 0-2  <3 RBC/hpf   Bacteria, UA RARE  RARE   Ct Abdomen Pelvis W Contrast  03/16/2013   CLINICAL DATA:  Left lower quadrant pain and nausea  EXAM: CT ABDOMEN AND PELVIS WITH CONTRAST  TECHNIQUE: Multidetector CT imaging of the abdomen and pelvis was performed using the standard protocol following bolus administration of intravenous contrast.  CONTRAST:  156mL OMNIPAQUE IOHEXOL 300 MG/ML  SOLN  COMPARISON:  CT scan of the abdomen and pelvis dated January 25, 2012.  FINDINGS: The gallbladder is surgically absent. There is minimal stable intrahepatic ductal dilation. Stable hypodensity adjacent to the falciform ligament in the left lobe is present. The pancreas, spleen, adrenal glands, and  kidneys exhibit no acute abnormalities. The stomach is partially distended with contrast. There is a small hiatal hernia. The duodenum is normal in appearance. The caliber of the abdominal aorta is normal.  The orally administered contrast has traversed the small bowel and reached as far distally as the proximal descending colon. There is no evidence of a small or large bowel obstruction. There is a moderate amount of gas within the small bowel loops in addition to the contrast. There is no definite thickening of the small bowel loops. The terminal ileum is normal in appearance. The appendix is not discretely demonstrated. The rectosigmoid colon exhibits numerous diverticula but there is no CT evidence of acute diverticulitis.  The urinary bladder is normal in appearance. The uterus  is surgically absent. There are no adnexal masses. There is no significant and inguinal or umbilical hernia. There is no free fluid in the abdomen or pelvis. No intra abdominal nor pelvic lymphadenopathy is demonstrated.  The patient has undergone previous low lower lumbar posterior fusion. The bony pelvis exhibits no acute abnormality. The lung bases are clear.  IMPRESSION: 1. There is no evidence of bowel obstruction or ileus. There is a moderate amount of gas within small bowel loops that may reflect an enteritis type process. There are numerous sigmoid diverticula demonstrated but no objective evidence of acute diverticulitis. Certainly low-grade diverticulitis could be present without CT findings. The appendix is not discretely demonstrated. 2. There is no acute hepatobiliary or urinary tract abnormality. 3. There is no intra abdominal or pelvic lymphadenopathy nor free fluid.   Electronically Signed   By: David  Martinique   On: 03/16/2013 13:44   Imaging Review No results found.  EKG Interpretation   None       MDM  No diagnosis found. 1. Abdominal pain 2. Enteritis  She appeared significantly uncomfortable on arrival.  She showed marked and steady improvement while in the ED on multiple re-evaluations. She is not ambulating to the bathroom, drinking fluids and is pain free. She reports she feels much better now and is comfortable going home in the care of her daughter. Will cover CT results (?enteritis) with antibiotics, give Zofran for nausea and limited pain medication. She has appointment in place with Dr. Inda Merlin on Monday (in 3 days). Strict return precautions given.     Dewaine Oats, PA-C 03/16/13 1502

## 2013-03-17 LAB — URINE CULTURE

## 2013-10-06 ENCOUNTER — Encounter: Payer: Self-pay | Admitting: *Deleted

## 2013-10-06 DIAGNOSIS — I2510 Atherosclerotic heart disease of native coronary artery without angina pectoris: Secondary | ICD-10-CM | POA: Insufficient documentation

## 2013-10-06 DIAGNOSIS — I25119 Atherosclerotic heart disease of native coronary artery with unspecified angina pectoris: Secondary | ICD-10-CM

## 2013-12-05 ENCOUNTER — Ambulatory Visit
Admission: RE | Admit: 2013-12-05 | Discharge: 2013-12-05 | Disposition: A | Discharge status: Home / Self Care | Payer: Medicare Other | Source: Ambulatory Visit | Attending: Family Medicine | Admitting: Family Medicine

## 2013-12-05 ENCOUNTER — Other Ambulatory Visit: Payer: Self-pay | Admitting: Family Medicine

## 2013-12-05 DIAGNOSIS — R059 Cough, unspecified: Secondary | ICD-10-CM

## 2013-12-05 DIAGNOSIS — R05 Cough: Secondary | ICD-10-CM

## 2014-01-09 ENCOUNTER — Ambulatory Visit
Admission: RE | Admit: 2014-01-09 | Discharge: 2014-01-09 | Disposition: A | Discharge status: Home / Self Care | Payer: Medicare Other | Source: Ambulatory Visit | Attending: Family Medicine | Admitting: Family Medicine

## 2014-01-09 ENCOUNTER — Other Ambulatory Visit: Payer: Self-pay | Admitting: Family Medicine

## 2014-01-09 DIAGNOSIS — R52 Pain, unspecified: Secondary | ICD-10-CM

## 2014-03-20 ENCOUNTER — Emergency Department (HOSPITAL_COMMUNITY)
Admission: EM | Admit: 2014-03-20 | Discharge: 2014-03-20 | Disposition: A | Discharge status: Home / Self Care | Payer: Medicare Other | Attending: Emergency Medicine | Admitting: Emergency Medicine

## 2014-03-20 ENCOUNTER — Encounter (HOSPITAL_COMMUNITY): Payer: Self-pay

## 2014-03-20 DIAGNOSIS — Z87448 Personal history of other diseases of urinary system: Secondary | ICD-10-CM | POA: Diagnosis not present

## 2014-03-20 DIAGNOSIS — E780 Pure hypercholesterolemia: Secondary | ICD-10-CM | POA: Diagnosis not present

## 2014-03-20 DIAGNOSIS — E079 Disorder of thyroid, unspecified: Secondary | ICD-10-CM | POA: Diagnosis not present

## 2014-03-20 DIAGNOSIS — Z79899 Other long term (current) drug therapy: Secondary | ICD-10-CM | POA: Insufficient documentation

## 2014-03-20 DIAGNOSIS — Z8711 Personal history of peptic ulcer disease: Secondary | ICD-10-CM | POA: Insufficient documentation

## 2014-03-20 DIAGNOSIS — I2510 Atherosclerotic heart disease of native coronary artery without angina pectoris: Secondary | ICD-10-CM | POA: Insufficient documentation

## 2014-03-20 DIAGNOSIS — F0390 Unspecified dementia without behavioral disturbance: Secondary | ICD-10-CM | POA: Insufficient documentation

## 2014-03-20 DIAGNOSIS — M5441 Lumbago with sciatica, right side: Secondary | ICD-10-CM | POA: Insufficient documentation

## 2014-03-20 DIAGNOSIS — H409 Unspecified glaucoma: Secondary | ICD-10-CM | POA: Diagnosis not present

## 2014-03-20 DIAGNOSIS — J449 Chronic obstructive pulmonary disease, unspecified: Secondary | ICD-10-CM | POA: Diagnosis not present

## 2014-03-20 DIAGNOSIS — E119 Type 2 diabetes mellitus without complications: Secondary | ICD-10-CM | POA: Insufficient documentation

## 2014-03-20 DIAGNOSIS — M1990 Unspecified osteoarthritis, unspecified site: Secondary | ICD-10-CM | POA: Diagnosis not present

## 2014-03-20 DIAGNOSIS — Z8619 Personal history of other infectious and parasitic diseases: Secondary | ICD-10-CM | POA: Diagnosis not present

## 2014-03-20 DIAGNOSIS — Z9049 Acquired absence of other specified parts of digestive tract: Secondary | ICD-10-CM | POA: Diagnosis not present

## 2014-03-20 DIAGNOSIS — Z8669 Personal history of other diseases of the nervous system and sense organs: Secondary | ICD-10-CM | POA: Insufficient documentation

## 2014-03-20 DIAGNOSIS — I10 Essential (primary) hypertension: Secondary | ICD-10-CM | POA: Diagnosis not present

## 2014-03-20 DIAGNOSIS — M549 Dorsalgia, unspecified: Secondary | ICD-10-CM | POA: Diagnosis present

## 2014-03-20 LAB — CBC
HCT: 36.8 % (ref 36.0–46.0)
Hemoglobin: 11.8 g/dL — ABNORMAL LOW (ref 12.0–15.0)
MCH: 29.4 pg (ref 26.0–34.0)
MCHC: 32.1 g/dL (ref 30.0–36.0)
MCV: 91.5 fL (ref 78.0–100.0)
PLATELETS: 250 10*3/uL (ref 150–400)
RBC: 4.02 MIL/uL (ref 3.87–5.11)
RDW: 13.9 % (ref 11.5–15.5)
WBC: 9.4 10*3/uL (ref 4.0–10.5)

## 2014-03-20 LAB — BASIC METABOLIC PANEL
ANION GAP: 7 (ref 5–15)
BUN: 39 mg/dL — ABNORMAL HIGH (ref 6–23)
CALCIUM: 8.6 mg/dL (ref 8.4–10.5)
CO2: 26 mmol/L (ref 19–32)
Chloride: 100 mmol/L (ref 96–112)
Creatinine, Ser: 0.89 mg/dL (ref 0.50–1.10)
GFR calc Af Amer: 65 mL/min — ABNORMAL LOW (ref >90)
GFR, EST NON AFRICAN AMERICAN: 56 mL/min — AB (ref >90)
Glucose, Bld: 153 mg/dL — ABNORMAL HIGH (ref 70–99)
Potassium: 4.5 mmol/L (ref 3.5–5.1)
SODIUM: 133 mmol/L — AB (ref 135–145)

## 2014-03-20 LAB — URINALYSIS, ROUTINE W REFLEX MICROSCOPIC
Bilirubin Urine: NEGATIVE
GLUCOSE, UA: NEGATIVE mg/dL
Hgb urine dipstick: NEGATIVE
KETONES UR: NEGATIVE mg/dL
NITRITE: NEGATIVE
PROTEIN: NEGATIVE mg/dL
Specific Gravity, Urine: 1.008 (ref 1.005–1.030)
Urobilinogen, UA: 0.2 mg/dL (ref 0.0–1.0)
pH: 6 (ref 5.0–8.0)

## 2014-03-20 LAB — URINE MICROSCOPIC-ADD ON

## 2014-03-20 MED ORDER — FENTANYL CITRATE 0.05 MG/ML IJ SOLN
50.0000 ug | Freq: Once | INTRAMUSCULAR | Status: AC
  Administered 2014-03-20: 50 ug via INTRAVENOUS
  Filled 2014-03-20: qty 2

## 2014-03-20 MED ORDER — OXYCODONE-ACETAMINOPHEN 10-325 MG PO TABS: 1.0000 | ORAL_TABLET | Freq: Four times a day (QID) | ORAL | Status: DC | PRN

## 2014-03-20 MED ORDER — HYDROCODONE-ACETAMINOPHEN 5-325 MG PO TABS
1.0000 | ORAL_TABLET | Freq: Once | ORAL | Status: AC
  Administered 2014-03-20: 1 via ORAL
  Filled 2014-03-20: qty 1

## 2014-03-20 NOTE — ED Notes (Signed)
She is comfortably sleeping; and I do not disturb her at this time.  Her skin is normal, warm and dry and she is breathing normally.  Her daughter and granddaughter are with her.

## 2014-03-20 NOTE — ED Notes (Signed)
She c/o chronic low back pain, radiating into right hip/leg which is worse this morning.  She saw her pcp ~ 1 week ago for same.  EMS states she was able to stand, bear weight and take a few steps to their stretcher.  She is in no distress.

## 2014-03-20 NOTE — Discharge Instructions (Signed)
i have discussed your care with your spine surgeon - he will see you in the office tomorrow.  Back Pain:   Your back pain should be treated with medicines such as ibuprofen or aleve and this back pain should get better over the next 2 weeks.  However if you develop severe or worsening pain, low back pain with fever, numbness, weakness or inability to walk or urinate, you should return to the ER immediately.  Please follow up with your doctor this week for a recheck if still having symptoms. Low back pain is discomfort in the lower back that may be due to injuries to muscles and ligaments around the spine.  Occasionally, it may be caused by a a problem to a part of the spine called a disc.  The pain may last several days or a week;  However, most patients get completely well in 4 weeks.  Self - care:  The application of heat can help soothe the pain.  Maintaining your daily activities, including walking, is encourged, as it will help you get better faster than just staying in bed.  Medications are also useful to help with pain control.  A commonly prescribed medications includes acetaminophen.  This medication is generally safe, though you should not take more than 8 of the extra strength (500mg ) pills a day.  Non steroidal anti inflammatory medications including Ibuprofen and naproxen;  These medications help both pain and swelling and are very useful in treating back pain.  They should be taken with food, as they can cause stomach upset, and more seriously, stomach bleeding.    Muscle relaxants:  These medications can help with muscle tightness that is a cause of lower back pain.  Most of these medications can cause drowsiness, and it is not safe to drive or use dangerous machinery while taking them.  You will need to follow up with  Your primary healthcare provider in 1-2 weeks for reassessment.  Be aware that if you develop new symptoms, such as a fever, leg weakness, difficulty with or loss of  control of your urine or bowels, abdominal pain, or more severe pain, you will need to seek medical attention and  / or return to the Emergency department.

## 2014-03-20 NOTE — ED Provider Notes (Signed)
CSN: [id number]     Arrival date & time 03/20/14  0828 History   First MD Initiated Contact with Patient 03/20/14 [phone]     Chief Complaint  Patient presents with  . Back Pain     (Consider location/radiation/quality/duration/timing/severity/associated sxs/prior Treatment) HPI Comments: The patient is an 81 year old female, she has a history of prior back surgery 6 years ago by Dr. Arnoldo Morale, has a history of worsening back pain which is located in the mid to right lower back, radiates into the right buttock and the right leg, has gradually worsened over the last 2 weeks and is now "unbearable". She has been using hydrocodone intermittently as needed and has had some relief until this morning when the pain medicine and gave her no relief. They report that she has been unable to stand and walk today, the paramedics do report that she was able to stand and bear weight and take a few steps to their stretcher. She denies fevers chills nausea vomiting diarrhea or weakness of the legs. She has no history of cancer or IV drug use. She does report intermittent dysuria. She had an MRI with her surgeon approximately one month ago which showed some stenosis in the right lateral recess consistent with the patient's location of pain and neurologic findings.  Patient is a 81 y.o. female presenting with back pain. The history is provided by the patient.  Back Pain   Past Medical History  Diagnosis Date  . Coronary artery disease   . Hypertension   . Arthritis   . Diabetes mellitus   . Renal disorder   . Sleep apnea   . Thyroid disease   . Glaucoma   . Osteopenia   . Elevated cholesterol   . Back pain   . PUD (peptic ulcer disease)   . Macular degeneration   . Typhoid fever   . COPD (chronic obstructive pulmonary disease)   . Dementia   . Back pain    Past Surgical History  Procedure Laterality Date  . Cholecystectomy    . Abdominal hysterectomy    . Cataract extraction    . Back surgery    .  Thyroid surgery     Family History  Problem Relation Age of Onset  . Leukemia Mother   . Pneumonia Father   . Diabetes Brother    History  Substance Use Topics  . Smoking status: Never Smoker   . Smokeless tobacco: Not on file  . Alcohol Use: No   OB History    No data available     Review of Systems  Musculoskeletal: Positive for back pain.  All other systems reviewed and are negative.     Allergies  Clonidine derivatives; Ezetimibe-simvastatin; Lipitor; Metformin and related; Morphine and related; and Codeine  Home Medications   Prior to Admission medications   Medication Sig Start Date End Date Taking? Authorizing Provider  acetaminophen (TYLENOL) 650 MG CR tablet Take 1,300 mg by mouth 2 (two) times daily.   Yes Historical Provider, MD  albuterol (PROVENTIL HFA;VENTOLIN HFA) 108 (90 BASE) MCG/ACT inhaler Inhale 2 puffs into the lungs every 4 (four) hours as needed for wheezing. wheezing   Yes Historical Provider, MD  amLODipine (NORVASC) 5 MG tablet Take 5 mg by mouth every morning.   Yes Historical Provider, MD  bimatoprost (LUMIGAN) 0.03 % ophthalmic solution Place 1 drop into both eyes at bedtime.   Yes Historical Provider, MD  brimonidine-timolol (COMBIGAN) 0.2-0.5 % ophthalmic solution Place 1 drop into both  eyes every 12 (twelve) hours.   Yes Historical Provider, MD  cholecalciferol (VITAMIN D) 1000 UNITS tablet Take 2,000 Units by mouth every morning.    Yes Historical Provider, MD  clobetasol cream (TEMOVATE) 6.27 % Apply 1 application topically 2 (two) times daily as needed (rash).   Yes Historical Provider, MD  fish oil-omega-3 fatty acids 1000 MG capsule Take 1 g by mouth every morning.    Yes Historical Provider, MD  gabapentin (NEURONTIN) 250 MG/5ML solution Take 500-1,000 mg by mouth 3 (three) times daily. Take 73ml= 500mg  in the morning and afternoon, and take 35ml or 1000mg  at bedtime   Yes Historical Provider, MD  glipiZIDE (GLUCOTROL XL) 10 MG 24 hr  tablet Take 1 tablet by mouth daily. 03/13/14  Yes Historical Provider, MD  HYDROcodone-acetaminophen (NORCO/VICODIN) 5-325 MG per tablet Take 1-2 tablets by mouth every 4 (four) hours as needed. 03/16/13  Yes Shari A Upstill, PA-C  levothyroxine (SYNTHROID, LEVOTHROID) 100 MCG tablet Take 100 mcg by mouth daily with breakfast.    Yes Historical Provider, MD  memantine (NAMENDA) 5 MG tablet Take 5 mg by mouth 2 (two) times daily.   Yes Historical Provider, MD  metoprolol tartrate (LOPRESSOR) 25 MG tablet Take 50 mg by mouth 2 (two) times daily.  02/18/13  Yes Historical Provider, MD  Multiple Vitamins-Minerals (PRESERVISION/LUTEIN PO) Take 1 tablet by mouth every morning.    Yes Historical Provider, MD  omeprazole (PRILOSEC) 20 MG capsule Take 40 mg by mouth daily.   Yes Historical Provider, MD  ondansetron (ZOFRAN) 4 MG tablet Take 1 tablet (4 mg total) by mouth every 6 (six) hours. 03/16/13  Yes Shari A Upstill, PA-C  pravastatin (PRAVACHOL) 40 MG tablet Take 40 mg by mouth every evening.    Yes Historical Provider, MD  Propylene Glycol 0.6 % SOLN Apply 1 drop to eye 2 (two) times daily as needed (eye irritation).   Yes Historical Provider, MD  traZODone (DESYREL) 50 MG tablet Take 50-150 mg by mouth at bedtime as needed for sleep.   Yes Historical Provider, MD  valsartan (DIOVAN) 320 MG tablet Take 1 tablet by mouth every morning. 03/09/13  Yes Historical Provider, MD  vitamin B-12 (CYANOCOBALAMIN) 1000 MCG tablet Take 2,000 mcg by mouth every morning.    Yes Historical Provider, MD  ciprofloxacin (CIPRO) 500 MG tablet Take 1 tablet (500 mg total) by mouth every 12 (twelve) hours. Patient not taking: Reported on 03/20/2014 03/16/13   Nehemiah Settle A Upstill, PA-C  metroNIDAZOLE (FLAGYL) 500 MG tablet Take 1 tablet (500 mg total) by mouth 2 (two) times daily. Patient not taking: Reported on 03/20/2014 03/16/13   Nehemiah Settle A Upstill, PA-C  oxyCODONE-acetaminophen (PERCOCET) 10-325 MG per tablet Take 1 tablet by mouth every  6 (six) hours as needed for pain. 03/20/14   Johnna Acosta, MD   BP 156/66 mmHg  Pulse 58  Temp(Src) 97.4 F (36.3 C) (Oral)  Resp 17  SpO2 98% Physical Exam  Constitutional: She appears well-developed and well-nourished. No distress.  HENT:  Head: Normocephalic and atraumatic.  Mouth/Throat: Oropharynx is clear and moist. No oropharyngeal exudate.  Eyes: Conjunctivae and EOM are normal. Pupils are equal, round, and reactive to light. Right eye exhibits no discharge. Left eye exhibits no discharge. No scleral icterus.  Neck: Normal range of motion. Neck supple. No JVD present. No thyromegaly present.  Cardiovascular: Normal rate, regular rhythm, normal heart sounds and intact distal pulses.  Exam reveals no gallop and no friction rub.  No murmur heard. Pulmonary/Chest: Effort normal and breath sounds normal. No respiratory distress. She has no wheezes. She has no rales.  Abdominal: Soft. Bowel sounds are normal. She exhibits no distension and no mass. There is no tenderness.  Musculoskeletal: Normal range of motion. She exhibits tenderness (tender to palpation over the lumbar spine and right paraspinal muscles). She exhibits no edema.  Lymphadenopathy:    She has no cervical adenopathy.  Neurological: She is alert. Coordination normal.  Able to straight leg raise bilaterally, pain with range of motion of the right lower extremity, normal strength at the hips knees and ankles bilaterally, normal sensation bilaterally.  Skin: Skin is warm and dry. No rash noted. No erythema.  Psychiatric: She has a normal mood and affect. Her behavior is normal.  Nursing note and vitals reviewed.   ED Course  Procedures (including critical care time) Labs Review Labs Reviewed  CBC - Abnormal; Notable for the following:    Hemoglobin 11.8 (*)    All other components within normal limits  BASIC METABOLIC PANEL - Abnormal; Notable for the following:    Sodium 133 (*)    Glucose, Bld 153 (*)    BUN  39 (*)    GFR calc non Af Amer 56 (*)    GFR calc Af Amer 65 (*)    All other components within normal limits  URINALYSIS, ROUTINE W REFLEX MICROSCOPIC - Abnormal; Notable for the following:    APPearance CLOUDY (*)    Leukocytes, UA TRACE (*)    All other components within normal limits  URINE MICROSCOPIC-ADD ON    Imaging Review No results found.    MDM   Final diagnoses:  Right-sided low back pain with right-sided sciatica    Normal vital signs, the patient has tenderness which is reproducible, worsening symptoms related to spinal abnormalities, his her to consult with neurosurgery, I will discuss the case with them. Pain control, rule out urinary tract infection, preop labs as needed.  Labs unremarkable - d/w Dr. Arnoldo Morale - will see in office tomorrow,.  Pt improved  VS normal  Will f/u tomorrow.  Meds given in ED:  Medications  fentaNYL (SUBLIMAZE) injection 50 mcg (50 mcg Intravenous Given 03/20/14 0926)    New Prescriptions   OXYCODONE-ACETAMINOPHEN (PERCOCET) 10-325 MG PER TABLET    Take 1 tablet by mouth every 6 (six) hours as needed for pain.      Johnna Acosta, MD 03/20/14 [phone]

## 2014-03-20 NOTE — ED Notes (Signed)
Bed: RA15 Expected date:  Expected time:  Means of arrival:  Comments: Ems- 81 yo low back pain

## 2014-04-24 ENCOUNTER — Emergency Department (HOSPITAL_COMMUNITY): Payer: Medicare Other

## 2014-04-24 ENCOUNTER — Ambulatory Visit
Admission: RE | Admit: 2014-04-24 | Discharge: 2014-04-24 | Disposition: A | Discharge status: Home / Self Care | Payer: 59 | Source: Ambulatory Visit | Attending: Family Medicine | Admitting: Family Medicine

## 2014-04-24 ENCOUNTER — Encounter (HOSPITAL_COMMUNITY): Payer: Self-pay | Admitting: *Deleted

## 2014-04-24 ENCOUNTER — Other Ambulatory Visit: Payer: Self-pay | Admitting: Family Medicine

## 2014-04-24 ENCOUNTER — Observation Stay (HOSPITAL_COMMUNITY)
Admission: EM | Admit: 2014-04-24 | Discharge: 2014-04-26 | Disposition: A | Discharge status: Home / Self Care | Payer: Medicare Other | Attending: Family Medicine | Admitting: Family Medicine

## 2014-04-24 DIAGNOSIS — K279 Peptic ulcer, site unspecified, unspecified as acute or chronic, without hemorrhage or perforation: Secondary | ICD-10-CM | POA: Diagnosis not present

## 2014-04-24 DIAGNOSIS — R0789 Other chest pain: Secondary | ICD-10-CM | POA: Diagnosis present

## 2014-04-24 DIAGNOSIS — M549 Dorsalgia, unspecified: Secondary | ICD-10-CM | POA: Diagnosis not present

## 2014-04-24 DIAGNOSIS — Z885 Allergy status to narcotic agent status: Secondary | ICD-10-CM | POA: Insufficient documentation

## 2014-04-24 DIAGNOSIS — J449 Chronic obstructive pulmonary disease, unspecified: Secondary | ICD-10-CM | POA: Insufficient documentation

## 2014-04-24 DIAGNOSIS — H3530 Unspecified macular degeneration: Secondary | ICD-10-CM | POA: Diagnosis not present

## 2014-04-24 DIAGNOSIS — Z888 Allergy status to other drugs, medicaments and biological substances status: Secondary | ICD-10-CM | POA: Diagnosis not present

## 2014-04-24 DIAGNOSIS — E079 Disorder of thyroid, unspecified: Secondary | ICD-10-CM | POA: Diagnosis not present

## 2014-04-24 DIAGNOSIS — Z79899 Other long term (current) drug therapy: Secondary | ICD-10-CM | POA: Diagnosis not present

## 2014-04-24 DIAGNOSIS — I25119 Atherosclerotic heart disease of native coronary artery with unspecified angina pectoris: Secondary | ICD-10-CM

## 2014-04-24 DIAGNOSIS — M1990 Unspecified osteoarthritis, unspecified site: Secondary | ICD-10-CM | POA: Diagnosis not present

## 2014-04-24 DIAGNOSIS — I10 Essential (primary) hypertension: Secondary | ICD-10-CM | POA: Insufficient documentation

## 2014-04-24 DIAGNOSIS — R011 Cardiac murmur, unspecified: Secondary | ICD-10-CM | POA: Diagnosis present

## 2014-04-24 DIAGNOSIS — W19XXXA Unspecified fall, initial encounter: Secondary | ICD-10-CM

## 2014-04-24 DIAGNOSIS — E871 Hypo-osmolality and hyponatremia: Secondary | ICD-10-CM | POA: Diagnosis not present

## 2014-04-24 DIAGNOSIS — H409 Unspecified glaucoma: Secondary | ICD-10-CM | POA: Insufficient documentation

## 2014-04-24 DIAGNOSIS — D72829 Elevated white blood cell count, unspecified: Secondary | ICD-10-CM | POA: Diagnosis present

## 2014-04-24 DIAGNOSIS — R5383 Other fatigue: Secondary | ICD-10-CM | POA: Diagnosis not present

## 2014-04-24 DIAGNOSIS — E119 Type 2 diabetes mellitus without complications: Secondary | ICD-10-CM | POA: Insufficient documentation

## 2014-04-24 DIAGNOSIS — M8580 Other specified disorders of bone density and structure, unspecified site: Secondary | ICD-10-CM | POA: Insufficient documentation

## 2014-04-24 DIAGNOSIS — R0781 Pleurodynia: Secondary | ICD-10-CM | POA: Diagnosis not present

## 2014-04-24 DIAGNOSIS — G4730 Sleep apnea, unspecified: Secondary | ICD-10-CM | POA: Diagnosis not present

## 2014-04-24 DIAGNOSIS — F0390 Unspecified dementia without behavioral disturbance: Secondary | ICD-10-CM | POA: Insufficient documentation

## 2014-04-24 DIAGNOSIS — I2510 Atherosclerotic heart disease of native coronary artery without angina pectoris: Secondary | ICD-10-CM | POA: Diagnosis not present

## 2014-04-24 LAB — BASIC METABOLIC PANEL
Anion gap: 9 (ref 5–15)
BUN: 22 mg/dL (ref 6–23)
CHLORIDE: 94 mmol/L — AB (ref 96–112)
CO2: 26 mmol/L (ref 19–32)
Calcium: 9.3 mg/dL (ref 8.4–10.5)
Creatinine, Ser: 0.72 mg/dL (ref 0.50–1.10)
GFR calc non Af Amer: 74 mL/min — ABNORMAL LOW (ref >90)
GFR, EST AFRICAN AMERICAN: 86 mL/min — AB (ref >90)
GLUCOSE: 107 mg/dL — AB (ref 70–99)
POTASSIUM: 4.4 mmol/L (ref 3.5–5.1)
Sodium: 129 mmol/L — ABNORMAL LOW (ref 135–145)

## 2014-04-24 LAB — URINALYSIS, ROUTINE W REFLEX MICROSCOPIC
Bilirubin Urine: NEGATIVE
Glucose, UA: NEGATIVE mg/dL
Hgb urine dipstick: NEGATIVE
KETONES UR: NEGATIVE mg/dL
NITRITE: NEGATIVE
Protein, ur: NEGATIVE mg/dL
Specific Gravity, Urine: 1.014 (ref 1.005–1.030)
Urobilinogen, UA: 0.2 mg/dL (ref 0.0–1.0)
pH: 6 (ref 5.0–8.0)

## 2014-04-24 LAB — CBC
HEMATOCRIT: 40.9 % (ref 36.0–46.0)
Hemoglobin: 13.7 g/dL (ref 12.0–15.0)
MCH: 29.1 pg (ref 26.0–34.0)
MCHC: 33.5 g/dL (ref 30.0–36.0)
MCV: 86.8 fL (ref 78.0–100.0)
PLATELETS: 313 10*3/uL (ref 150–400)
RBC: 4.71 MIL/uL (ref 3.87–5.11)
RDW: 13.2 % (ref 11.5–15.5)
WBC: 12.4 10*3/uL — AB (ref 4.0–10.5)

## 2014-04-24 LAB — URINE MICROSCOPIC-ADD ON

## 2014-04-24 LAB — GLUCOSE, CAPILLARY: Glucose-Capillary: 140 mg/dL — ABNORMAL HIGH (ref 70–99)

## 2014-04-24 LAB — I-STAT TROPONIN, ED: Troponin i, poc: 0 ng/mL (ref 0.00–0.08)

## 2014-04-24 MED ORDER — DOCUSATE SODIUM 100 MG PO CAPS
100.0000 mg | ORAL_CAPSULE | Freq: Every day | ORAL | Status: DC
  Administered 2014-04-25 – 2014-04-26 (×3): 100 mg via ORAL
  Filled 2014-04-24 (×3): qty 1

## 2014-04-24 MED ORDER — SODIUM CHLORIDE 0.9 % IV SOLN: INTRAVENOUS | Status: DC

## 2014-04-24 MED ORDER — GABAPENTIN 250 MG/5ML PO SOLN
500.0000 mg | ORAL | Status: DC
  Administered 2014-04-25 (×2): 500 mg via ORAL
  Filled 2014-04-24 (×8): qty 10

## 2014-04-24 MED ORDER — METOPROLOL TARTRATE 50 MG PO TABS
50.0000 mg | ORAL_TABLET | Freq: Two times a day (BID) | ORAL | Status: DC
  Administered 2014-04-24 – 2014-04-25 (×2): 50 mg via ORAL
  Filled 2014-04-24 (×3): qty 1

## 2014-04-24 MED ORDER — ACETAMINOPHEN 325 MG PO TABS
650.0000 mg | ORAL_TABLET | Freq: Four times a day (QID) | ORAL | Status: DC | PRN
  Administered 2014-04-25: 650 mg via ORAL
  Filled 2014-04-24: qty 2

## 2014-04-24 MED ORDER — ONDANSETRON HCL 4 MG/2ML IJ SOLN: 4.0000 mg | Freq: Four times a day (QID) | INTRAMUSCULAR | Status: DC | PRN

## 2014-04-24 MED ORDER — AMLODIPINE BESYLATE 5 MG PO TABS
5.0000 mg | ORAL_TABLET | Freq: Every morning | ORAL | Status: DC
  Administered 2014-04-25: 5 mg via ORAL
  Filled 2014-04-24: qty 1

## 2014-04-24 MED ORDER — BRIMONIDINE TARTRATE-TIMOLOL 0.2-0.5 % OP SOLN: 1.0000 [drp] | Freq: Two times a day (BID) | OPHTHALMIC | Status: DC

## 2014-04-24 MED ORDER — BISACODYL 10 MG RE SUPP: 10.0000 mg | Freq: Every day | RECTAL | Status: DC | PRN

## 2014-04-24 MED ORDER — HYDROCODONE-ACETAMINOPHEN 10-325 MG PO TABS
1.0000 | ORAL_TABLET | Freq: Four times a day (QID) | ORAL | Status: DC | PRN
  Administered 2014-04-24 – 2014-04-25 (×2): 1 via ORAL
  Filled 2014-04-24 (×2): qty 1

## 2014-04-24 MED ORDER — TIMOLOL MALEATE 0.5 % OP SOLN
1.0000 [drp] | Freq: Two times a day (BID) | OPHTHALMIC | Status: DC
  Administered 2014-04-24 – 2014-04-26 (×4): 1 [drp] via OPHTHALMIC
  Filled 2014-04-24: qty 5

## 2014-04-24 MED ORDER — SODIUM CHLORIDE 0.9 % IJ SOLN
3.0000 mL | Freq: Two times a day (BID) | INTRAMUSCULAR | Status: DC
  Administered 2014-04-24 – 2014-04-25 (×2): 3 mL via INTRAVENOUS

## 2014-04-24 MED ORDER — PANTOPRAZOLE SODIUM 40 MG PO TBEC
40.0000 mg | DELAYED_RELEASE_TABLET | Freq: Every day | ORAL | Status: DC
  Administered 2014-04-25 – 2014-04-26 (×3): 40 mg via ORAL
  Filled 2014-04-24 (×3): qty 1

## 2014-04-24 MED ORDER — GLIPIZIDE ER 10 MG PO TB24
10.0000 mg | ORAL_TABLET | Freq: Every day | ORAL | Status: DC
  Administered 2014-04-25: 10 mg via ORAL
  Filled 2014-04-24 (×2): qty 1

## 2014-04-24 MED ORDER — SODIUM CHLORIDE 0.9 % IV SOLN
INTRAVENOUS | Status: AC
  Administered 2014-04-24: 21:00:00 via INTRAVENOUS

## 2014-04-24 MED ORDER — HYDROCODONE-ACETAMINOPHEN 5-325 MG PO TABS
1.0000 | ORAL_TABLET | Freq: Once | ORAL | Status: AC
  Administered 2014-04-24: 1 via ORAL
  Filled 2014-04-24: qty 1

## 2014-04-24 MED ORDER — LEVOTHYROXINE SODIUM 100 MCG PO TABS
100.0000 ug | ORAL_TABLET | Freq: Every day | ORAL | Status: DC
  Administered 2014-04-25 – 2014-04-26 (×2): 100 ug via ORAL
  Filled 2014-04-24 (×3): qty 1

## 2014-04-24 MED ORDER — FLEET ENEMA 7-19 GM/118ML RE ENEM: 1.0000 | ENEMA | Freq: Once | RECTAL | Status: AC | PRN

## 2014-04-24 MED ORDER — INSULIN ASPART 100 UNIT/ML ~~LOC~~ SOLN: 0.0000 [IU] | Freq: Every day | SUBCUTANEOUS | Status: DC

## 2014-04-24 MED ORDER — GABAPENTIN 250 MG/5ML PO SOLN
1000.0000 mg | Freq: Every day | ORAL | Status: DC
  Administered 2014-04-25 (×2): 1000 mg via ORAL
  Filled 2014-04-24 (×6): qty 20

## 2014-04-24 MED ORDER — LORAZEPAM 0.5 MG PO TABS
0.5000 mg | ORAL_TABLET | Freq: Every day | ORAL | Status: DC
  Administered 2014-04-24 – 2014-04-25 (×2): 0.5 mg via ORAL
  Filled 2014-04-24 (×2): qty 1

## 2014-04-24 MED ORDER — ONDANSETRON HCL 4 MG PO TABS: 4.0000 mg | ORAL_TABLET | Freq: Four times a day (QID) | ORAL | Status: DC | PRN

## 2014-04-24 MED ORDER — LATANOPROST 0.005 % OP SOLN
1.0000 [drp] | Freq: Every day | OPHTHALMIC | Status: DC
Start: 2014-04-24 — End: 2014-04-26
  Administered 2014-04-24 – 2014-04-25 (×2): 1 [drp] via OPHTHALMIC
  Filled 2014-04-24: qty 2.5

## 2014-04-24 MED ORDER — SODIUM CHLORIDE 0.9 % IV SOLN
INTRAVENOUS | Status: DC
  Administered 2014-04-24: 75 mL/h via INTRAVENOUS

## 2014-04-24 MED ORDER — ACETAMINOPHEN 650 MG RE SUPP: 650.0000 mg | Freq: Four times a day (QID) | RECTAL | Status: DC | PRN

## 2014-04-24 MED ORDER — INSULIN ASPART 100 UNIT/ML ~~LOC~~ SOLN
0.0000 [IU] | Freq: Three times a day (TID) | SUBCUTANEOUS | Status: DC
  Administered 2014-04-25: 2 [IU] via SUBCUTANEOUS
  Administered 2014-04-26: 3 [IU] via SUBCUTANEOUS

## 2014-04-24 MED ORDER — PRAVASTATIN SODIUM 40 MG PO TABS
40.0000 mg | ORAL_TABLET | Freq: Every evening | ORAL | Status: DC
  Administered 2014-04-25 – 2014-04-26 (×2): 40 mg via ORAL
  Filled 2014-04-24 (×2): qty 1

## 2014-04-24 MED ORDER — IRBESARTAN 300 MG PO TABS
300.0000 mg | ORAL_TABLET | Freq: Every day | ORAL | Status: DC
  Administered 2014-04-25: 300 mg via ORAL
  Filled 2014-04-24: qty 1

## 2014-04-24 MED ORDER — MEMANTINE HCL 5 MG PO TABS
5.0000 mg | ORAL_TABLET | Freq: Two times a day (BID) | ORAL | Status: DC
  Administered 2014-04-24 – 2014-04-26 (×4): 5 mg via ORAL
  Filled 2014-04-24 (×5): qty 1

## 2014-04-24 MED ORDER — ENOXAPARIN SODIUM 40 MG/0.4ML ~~LOC~~ SOLN
40.0000 mg | Freq: Every day | SUBCUTANEOUS | Status: DC
  Administered 2014-04-24: 40 mg via SUBCUTANEOUS
  Filled 2014-04-24 (×2): qty 0.4

## 2014-04-24 MED ORDER — POLYETHYLENE GLYCOL 3350 17 G PO PACK: 17.0000 g | PACK | Freq: Every day | ORAL | Status: DC | PRN

## 2014-04-24 MED ORDER — BRIMONIDINE TARTRATE 0.2 % OP SOLN
1.0000 [drp] | Freq: Two times a day (BID) | OPHTHALMIC | Status: DC
  Administered 2014-04-24 – 2014-04-26 (×4): 1 [drp] via OPHTHALMIC
  Filled 2014-04-24: qty 5

## 2014-04-24 MED ORDER — CLONIDINE HCL 0.1 MG PO TABS
0.1000 mg | ORAL_TABLET | Freq: Every day | ORAL | Status: DC
  Administered 2014-04-24 – 2014-04-25 (×2): 0.1 mg via ORAL
  Filled 2014-04-24 (×3): qty 1

## 2014-04-24 NOTE — ED Provider Notes (Signed)
CSN: [id number]     Arrival date & time 04/24/14  1541 History   First MD Initiated Contact with Patient 04/24/14 1654     Chief Complaint  Patient presents with  . right sided pain    . Fatigue      The history is provided by the patient, a caregiver and a relative. The history is limited by the condition of the patient (Hx dementia).  Pt was seen at 1705. Per pt and her family:  Pt's family states pt has been gradually "weaker" and "acting more confused than usual" for the past 1 week. Pt slipped and fell 2 days ago while walking at home ("got caught up in her own feet"). Pt fell onto her right side. Pt c/o right ribs pain since the fall. Pt did not have LOC/syncope. Pt's family took pt to her PMD yesterday and "had a xray;" of which they do not know the results. Pt took one vicodin earlier today for pain without improvement. Denies new fall, no abd pain, no SOB/cough, no fevers, no neck or back pain.    Past Medical History  Diagnosis Date  . Coronary artery disease   . Hypertension   . Arthritis   . Diabetes mellitus   . Renal disorder   . Sleep apnea   . Thyroid disease   . Glaucoma   . Osteopenia   . Elevated cholesterol   . Back pain   . PUD (peptic ulcer disease)   . Macular degeneration   . Typhoid fever   . COPD (chronic obstructive pulmonary disease)   . Dementia   . Back pain    Past Surgical History  Procedure Laterality Date  . Cholecystectomy    . Abdominal hysterectomy    . Cataract extraction    . Back surgery    . Thyroid surgery     Family History  Problem Relation Age of Onset  . Leukemia Mother   . Pneumonia Father   . Diabetes Brother    History  Substance Use Topics  . Smoking status: Never Smoker   . Smokeless tobacco: Not on file  . Alcohol Use: No    Review of Systems  Unable to perform ROS: Dementia      Allergies  Clonidine derivatives; Ezetimibe-simvastatin; Lipitor; Metformin and related; Morphine and related; and  Codeine  Home Medications   Prior to Admission medications   Medication Sig Start Date End Date Taking? Authorizing Provider  acetaminophen (TYLENOL) 650 MG CR tablet Take 1,300 mg by mouth 2 (two) times daily.   Yes Historical Provider, MD  amLODipine (NORVASC) 5 MG tablet Take 5 mg by mouth every morning.   Yes Historical Provider, MD  bimatoprost (LUMIGAN) 0.03 % ophthalmic solution Place 1 drop into both eyes at bedtime.   Yes Historical Provider, MD  brimonidine-timolol (COMBIGAN) 0.2-0.5 % ophthalmic solution Place 1 drop into both eyes every 12 (twelve) hours.   Yes Historical Provider, MD  cholecalciferol (VITAMIN D) 1000 UNITS tablet Take 2,000 Units by mouth every morning.    Yes Historical Provider, MD  cloNIDine (CATAPRES) 0.1 MG tablet Take 1 tablet by mouth at bedtime. 04/08/14  Yes Historical Provider, MD  docusate sodium (COLACE) 100 MG capsule Take 100 mg by mouth daily.   Yes Historical Provider, MD  fish oil-omega-3 fatty acids 1000 MG capsule Take 1 g by mouth every morning.    Yes Historical Provider, MD  gabapentin (NEURONTIN) 250 MG/5ML solution Take 500-1,000 mg by mouth 3 (  three) times daily. Take 37ml= 500mg  in the morning and afternoon, and take 77ml or 1000mg  at bedtime   Yes Historical Provider, MD  glipiZIDE (GLUCOTROL XL) 10 MG 24 hr tablet Take 1 tablet by mouth daily. 03/13/14  Yes Historical Provider, MD  HYDROcodone-acetaminophen (NORCO) 10-325 MG per tablet Take 1 tablet by mouth every 8 (eight) hours as needed. 04/12/14  Yes Historical Provider, MD  levothyroxine (SYNTHROID, LEVOTHROID) 100 MCG tablet Take 100 mcg by mouth daily with breakfast.    Yes Historical Provider, MD  LORazepam (ATIVAN) 0.5 MG tablet Take 1 tablet by mouth at bedtime. 04/08/14  Yes Historical Provider, MD  memantine (NAMENDA) 5 MG tablet Take 5 mg by mouth 2 (two) times daily.   Yes Historical Provider, MD  metoprolol tartrate (LOPRESSOR) 25 MG tablet Take 50 mg by mouth 2 (two) times daily.   02/18/13  Yes Historical Provider, MD  Multiple Vitamins-Minerals (PRESERVISION/LUTEIN PO) Take 1 tablet by mouth every morning.    Yes Historical Provider, MD  omeprazole (PRILOSEC) 20 MG capsule Take 40 mg by mouth daily.   Yes Historical Provider, MD  pravastatin (PRAVACHOL) 40 MG tablet Take 40 mg by mouth every evening.    Yes Historical Provider, MD  Propylene Glycol 0.6 % SOLN Apply 1 drop to eye 2 (two) times daily as needed (eye irritation).   Yes Historical Provider, MD  valsartan (DIOVAN) 320 MG tablet Take 1 tablet by mouth every morning. 03/09/13  Yes Historical Provider, MD  vitamin B-12 (CYANOCOBALAMIN) 1000 MCG tablet Take 2,000 mcg by mouth every morning.    Yes Historical Provider, MD  ciprofloxacin (CIPRO) 500 MG tablet Take 1 tablet (500 mg total) by mouth every 12 (twelve) hours. Patient not taking: Reported on 03/20/2014 03/16/13   Charlann Lange, PA-C  HYDROcodone-acetaminophen (NORCO/VICODIN) 5-325 MG per tablet Take 1-2 tablets by mouth every 4 (four) hours as needed. 03/16/13   Charlann Lange, PA-C  metroNIDAZOLE (FLAGYL) 500 MG tablet Take 1 tablet (500 mg total) by mouth 2 (two) times daily. Patient not taking: Reported on 03/20/2014 03/16/13   Charlann Lange, PA-C  ondansetron (ZOFRAN) 4 MG tablet Take 1 tablet (4 mg total) by mouth every 6 (six) hours. 03/16/13   Charlann Lange, PA-C  oxyCODONE-acetaminophen (PERCOCET) 10-325 MG per tablet Take 1 tablet by mouth every 6 (six) hours as needed for pain. 03/20/14   Noemi Chapel, MD   BP 181/73 mmHg  Pulse 86  Temp(Src) 97.6 F (36.4 C) (Oral)  Resp 16  SpO2 99% Physical Exam  1710: Physical examination:  Nursing notes reviewed; Vital signs and O2 SAT reviewed;  Constitutional: Well developed, Well nourished, Well hydrated, Uncomfortable appearing.;; Head:  Normocephalic, atraumatic; Eyes: EOMI, PERRL, No scleral icterus; ENMT: Mouth and pharynx normal, Mucous membranes moist; Neck: Supple, Full range of motion, No lymphadenopathy;  Cardiovascular: Regular rate and rhythm, No gallop; Respiratory: Breath sounds clear & equal bilaterally, No wheezes.  Speaking full sentences with ease, Normal respiratory effort/excursion; Chest: +right lower lateral-anterior ribs tender to palp. No deformity, no soft tissue crepitus, no ecchymosis, no abrasions. Movement normal; Abdomen: Soft, No ecchymosis or abrasions. Nontender, Nondistended, Normal bowel sounds; Genitourinary: No CVA tenderness; Spine:  No midline CS, TS, LS tenderness. No ecchymosis or abrasions.;; Extremities: Pulses normal, Pelvis stable. No tenderness, No edema, No calf edema or asymmetry.; Neuro: Awake, alert, confused re: events. Major CN grossly intact. No facial droop. Speech clear. No gross focal motor or sensory deficits in extremities. Climbs on and off stretcher  easily by herself. Gait steady.; Skin: Color normal, Warm, Dry.   ED Course  Procedures     EKG Interpretation   Date/Time:  Wednesday April 24 2014 15:50:38 EDT Ventricular Rate:  88 PR Interval:  141 QRS Duration: 86 QT Interval:  365 QTC Calculation: 442 R Axis:   36 Text Interpretation:  Sinus rhythm Anterior infarct, old Baseline wander  When compared with ECG of 01/25/2012 No significant change was found  Confirmed by [facility]  MD, Nunzio Cory [phone]) on 04/24/2014 5:18:05 PM      MDM  MDM Reviewed: previous chart, nursing note and vitals Reviewed previous: labs Interpretation: labs and x-ray Total time providing critical care: 30-74 minutes. This excludes time spent performing separately reportable procedures and services. Consults: admitting MD   CRITICAL CARE Performed by: Alfonzo Feller Total critical care time: 35 Critical care time was exclusive of separately billable procedures and treating other patients. Critical care was necessary to treat or prevent imminent or life-threatening deterioration. Critical care was time spent personally by me on the following activities:  development of treatment plan with patient and/or surrogate as well as nursing, discussions with consultants, evaluation of patient's response to treatment, examination of patient, obtaining history from patient or surrogate, ordering and performing treatments and interventions, ordering and review of laboratory studies, ordering and review of radiographic studies, pulse oximetry and re-evaluation of patient's condition.     Results for orders placed or performed during the hospital encounter of 04/24/14  CBC  Result Value Ref Range   WBC 12.4 (H) 4.0 - 10.5 K/uL   RBC 4.71 3.87 - 5.11 MIL/uL   Hemoglobin 13.7 12.0 - 15.0 g/dL   HCT 40.9 36.0 - 46.0 %   MCV 86.8 78.0 - 100.0 fL   MCH 29.1 26.0 - 34.0 pg   MCHC 33.5 30.0 - 36.0 g/dL   RDW 13.2 11.5 - 15.5 %   Platelets 313 150 - 400 K/uL  Basic metabolic panel  Result Value Ref Range   Sodium 129 (L) 135 - 145 mmol/L   Potassium 4.4 3.5 - 5.1 mmol/L   Chloride 94 (L) 96 - 112 mmol/L   CO2 26 19 - 32 mmol/L   Glucose, Bld 107 (H) 70 - 99 mg/dL   BUN 22 6 - 23 mg/dL   Creatinine, Ser 0.72 0.50 - 1.10 mg/dL   Calcium 9.3 8.4 - 10.5 mg/dL   GFR calc non Af Amer 74 (L) >90 mL/min   GFR calc Af Amer 86 (L) >90 mL/min   Anion gap 9 5 - 15  I-stat troponin, ED (not at [facility])  Result Value Ref Range   Troponin i, poc 0.00 0.00 - 0.08 ng/mL   Comment 3           Dg Chest 2 View 04/24/2014   CLINICAL DATA:  Fall 2 days ago, chest pain  EXAM: CHEST  2 VIEW  COMPARISON:  12/05/2013  FINDINGS: Cardiac shadow is stable. The lungs are well aerated bilaterally. No focal infiltrate or sizable effusion is seen. Chronic compression deformity is noted in the lower thoracic spine. Old rib fractures are noted bilaterally.  IMPRESSION: No acute abnormality noted.   Electronically Signed   By: Inez Catalina M.D.   On: 04/24/2014 14:53   Dg Ribs Unilateral Right 04/24/2014   CLINICAL DATA:  Initial evaluation for acute right-sided rib pain status post fall.   EXAM: RIGHT RIBS - 2 VIEW  COMPARISON:  Prior radiograph of the chest performed earlier  on the same day.  FINDINGS: Dedicated views of the right ribs demonstrate no acute displaced rib fracture.  Visualized heart and lungs demonstrate no acute abnormality. Surgical clips overlie the neck. Spinal fixation hardware noted.  IMPRESSION: No acute displaced rib fracture identified.   Electronically Signed   By: Jeannine Boga M.D.   On: 04/24/2014 17:39   Results for ANDREA, FERRER (MRN [id number]) as of 04/24/2014 18:59  Ref. Range 02/23/2011 14:55 01/25/2012 14:42 03/16/2013 11:32 03/20/2014 09:17 04/24/2014 16:10  Sodium Latest Range: 135-145 mmol/L 131 (L) 126 (L) 135 (L) 133 (L) 129 (L)     1830:  New hyponatremia on today's labs (had previous hyponatremia 01/25/2012 during N/V/D illness per EPIC chart review). Will continue judicious IVF.  Pt more comfortable after pain meds. Dx and testing d/w pt and family.  Questions answered.  Verb understanding, agreeable to admit. T/C to [facility] Dr. Roel Cluck, case discussed, including:  HPI, pertinent PM/SHx, VS/PE, dx testing, ED course and treatment:  Agreeable to admit, requests she will come to the ED for evaluation.   Francine Graven, DO 04/26/14 [phone]

## 2014-04-24 NOTE — ED Notes (Signed)
Pt and family report pt fell on Monday onto her right side, yesterday taken to pcp, to late yesterday to do xrays. Took pt to get xrays this morning, pt took a nap and woke up in pain, pt moaning and states right side breat area hurts. xrays from pcp not back. Pain 10/10.

## 2014-04-24 NOTE — H&P (Signed)
PCP:  Marjorie Smolder, MD  Vascular Surgery [facility] Neurosurgery Arnold[facility] Morale  Chief Complaint:  Lori Long   HPI: Lori Long is a 81 y.o. female   has a past medical history of Coronary artery disease; Hypertension; Arthritis; Diabetes mellitus; Renal disorder; Sleep apnea; Thyroid disease; Glaucoma; Osteopenia; Elevated cholesterol; Back pain; PUD (peptic ulcer disease); Macular degeneration; Typhoid fever; COPD (chronic obstructive pulmonary disease); Dementia; and Back pain.   Presented with  Patient had a fall 2 days ago. She was standing on the porch and turned lost balance and fell. Patient fell on her right side and hit her head.  PAtietn seemed to do well at first. She started to complain about right side rib pain. Patient was seen at her PCP office and had some imaging done.  Today patient woke up form the nap and started to complain about right side rib pain. Her labs were obtained and showed Na of 129 she have had hyponatremia for some time fluctuating around 133- 126. Family states she eats very little but drinks lots of milk and water. Patient have been unsteady on her feet. Denies headaches. Family denies worsening confusion since the fall.  Patient has chronic back and leg pain that has been recently evaluated by Dr. Arnoldo Morale. We do not have access to the MRI results. Hospitalist was called for admission for hyponatremia  Review of Systems:    Pertinent positives include:  Fall, unstable gait, back pain  Constitutional:  No weight loss, night sweats, Fevers, chills, fatigue, weight loss  HEENT:  No headaches, Difficulty swallowing,Tooth/dental problems,Sore throat,  No sneezing, itching, ear ache, nasal congestion, post nasal drip,  Cardio-vascular:  No chest pain, Orthopnea, PND, anasarca, dizziness, palpitations.no Bilateral lower extremity swelling  GI:  No heartburn, indigestion, abdominal pain, nausea, vomiting, diarrhea, change in bowel habits, loss of appetite,  melena, blood in stool, hematemesis Resp:  no shortness of breath at rest. No dyspnea on exertion, No excess mucus, no productive cough, No non-productive cough, No coughing up of blood.No change in color of mucus.No wheezing. Skin:  no rash or lesions. No jaundice GU:  no dysuria, change in color of urine, no urgency or frequency. No straining to urinate.  No flank pain.  Musculoskeletal:  No joint pain or no joint swelling. No decreased range of motion. No back pain.  Psych:  No change in mood or affect. No depression or anxiety. No memory loss.  Neuro: no localizing neurological complaints, no tingling, no weakness, no double vision, no gait abnormality, no slurred speech, no confusion  Otherwise ROS are negative except for above, 10 systems were reviewed  Past Medical History: Past Medical History  Diagnosis Date  . Coronary artery disease   . Hypertension   . Arthritis   . Diabetes mellitus   . Renal disorder   . Sleep apnea   . Thyroid disease   . Glaucoma   . Osteopenia   . Elevated cholesterol   . Back pain   . PUD (peptic ulcer disease)   . Macular degeneration   . Typhoid fever   . COPD (chronic obstructive pulmonary disease)   . Dementia   . Back pain    Past Surgical History  Procedure Laterality Date  . Cholecystectomy    . Abdominal hysterectomy    . Cataract extraction    . Back surgery    . Thyroid surgery       Medications: Prior to Admission medications   Medication Sig Start Date End Date  Taking? Authorizing Provider  acetaminophen (TYLENOL) 650 MG CR tablet Take 1,300 mg by mouth 2 (two) times daily.   Yes Historical Provider, MD  amLODipine (NORVASC) 5 MG tablet Take 5 mg by mouth every morning.   Yes Historical Provider, MD  bimatoprost (LUMIGAN) 0.03 % ophthalmic solution Place 1 drop into both eyes at bedtime.   Yes Historical Provider, MD  brimonidine-timolol (COMBIGAN) 0.2-0.5 % ophthalmic solution Place 1 drop into both eyes every 12  (twelve) hours.   Yes Historical Provider, MD  cholecalciferol (VITAMIN D) 1000 UNITS tablet Take 2,000 Units by mouth every morning.    Yes Historical Provider, MD  cloNIDine (CATAPRES) 0.1 MG tablet Take 1 tablet by mouth at bedtime. 04/08/14  Yes Historical Provider, MD  docusate sodium (COLACE) 100 MG capsule Take 100 mg by mouth daily.   Yes Historical Provider, MD  fish oil-omega-3 fatty acids 1000 MG capsule Take 1 g by mouth every morning.    Yes Historical Provider, MD  gabapentin (NEURONTIN) 250 MG/5ML solution Take 500-1,000 mg by mouth 3 (three) times daily. Take 65ml= 500mg  in the morning and afternoon, and take 37ml or 1000mg  at bedtime   Yes Historical Provider, MD  glipiZIDE (GLUCOTROL XL) 10 MG 24 hr tablet Take 1 tablet by mouth daily. 03/13/14  Yes Historical Provider, MD  HYDROcodone-acetaminophen (NORCO) 10-325 MG per tablet Take 1 tablet by mouth every 8 (eight) hours as needed. 04/12/14  Yes Historical Provider, MD  levothyroxine (SYNTHROID, LEVOTHROID) 100 MCG tablet Take 100 mcg by mouth daily with breakfast.    Yes Historical Provider, MD  LORazepam (ATIVAN) 0.5 MG tablet Take 1 tablet by mouth at bedtime. 04/08/14  Yes Historical Provider, MD  memantine (NAMENDA) 5 MG tablet Take 5 mg by mouth 2 (two) times daily.   Yes Historical Provider, MD  metoprolol tartrate (LOPRESSOR) 25 MG tablet Take 50 mg by mouth 2 (two) times daily.  02/18/13  Yes Historical Provider, MD  Multiple Vitamins-Minerals (PRESERVISION/LUTEIN PO) Take 1 tablet by mouth every morning.    Yes Historical Provider, MD  omeprazole (PRILOSEC) 20 MG capsule Take 40 mg by mouth daily.   Yes Historical Provider, MD  pravastatin (PRAVACHOL) 40 MG tablet Take 40 mg by mouth every evening.    Yes Historical Provider, MD  Propylene Glycol 0.6 % SOLN Apply 1 drop to eye 2 (two) times daily as needed (eye irritation).   Yes Historical Provider, MD  valsartan (DIOVAN) 320 MG tablet Take 1 tablet by mouth every morning.  03/09/13  Yes Historical Provider, MD  vitamin B-12 (CYANOCOBALAMIN) 1000 MCG tablet Take 2,000 mcg by mouth every morning.    Yes Historical Provider, MD  ciprofloxacin (CIPRO) 500 MG tablet Take 1 tablet (500 mg total) by mouth every 12 (twelve) hours. Patient not taking: Reported on 03/20/2014 03/16/13   Charlann Lange, PA-C  HYDROcodone-acetaminophen (NORCO/VICODIN) 5-325 MG per tablet Take 1-2 tablets by mouth every 4 (four) hours as needed. 03/16/13   Charlann Lange, PA-C  metroNIDAZOLE (FLAGYL) 500 MG tablet Take 1 tablet (500 mg total) by mouth 2 (two) times daily. Patient not taking: Reported on 03/20/2014 03/16/13   Charlann Lange, PA-C  ondansetron (ZOFRAN) 4 MG tablet Take 1 tablet (4 mg total) by mouth every 6 (six) hours. 03/16/13   Charlann Lange, PA-C  oxyCODONE-acetaminophen (PERCOCET) 10-325 MG per tablet Take 1 tablet by mouth every 6 (six) hours as needed for pain. 03/20/14   Noemi Chapel, MD    Allergies:   Allergies  Allergen Reactions  . Clonidine Derivatives     Local reaction to patch.  . Ezetimibe-Simvastatin Other (See Comments)    Leg pain from Vytorin  . Lipitor [Atorvastatin Calcium] Other (See Comments)    Couldn't tolerate  . Metformin And Related Diarrhea  . Morphine And Related Other (See Comments)    hallucination  . Codeine Rash    Social History:  Ambulatory  Walker or  cane,   Lives at home alone,   But has aid who come out to see her on regular bases.      reports that she has never smoked. She does not have any smokeless tobacco history on file. She reports that she does not drink alcohol or use illicit drugs.    Family History: family history includes Diabetes in her brother; Leukemia in her mother; Pneumonia in her father.    Physical Exam: Patient Vitals for the past 24 hrs:  BP Temp Temp src Pulse Resp SpO2  04/24/14 1556 181/73 mmHg 97.6 F (36.4 C) Oral 86 16 99 %    1. General:  in No Acute distress 2. Psychological: Alert and  Oriented  to self and place not time 3. Head/ENT:   Moist  Mucous Membranes                          Head Non traumatic, neck supple                          Normal  Dentition 4. SKIN:  decreased Skin turgor,  Skin clean Dry and intact no rash 5. Heart: Regular rate and rhythm systolic Murmur, Rub or gallop 6. Lungs: Clear to auscultation bilaterally, no wheezes or crackles   7. Abdomen: Soft, non-tender, slightly distended 8. Lower extremities: no clubbing, cyanosis, or edema 9. Neurologically Grossly intact, moving all 4 extremities equally 10. MSK: Normal range of motion  body mass index is unknown because there is no height or weight on file.   Labs on Admission:   Results for orders placed or performed during the hospital encounter of 04/24/14 (from the past 24 hour(s))  CBC     Status: Abnormal   Collection Time: 04/24/14  4:10 PM  Result Value Ref Range   WBC 12.4 (H) 4.0 - 10.5 K/uL   RBC 4.71 3.87 - 5.11 MIL/uL   Hemoglobin 13.7 12.0 - 15.0 g/dL   HCT 40.9 36.0 - 46.0 %   MCV 86.8 78.0 - 100.0 fL   MCH 29.1 26.0 - 34.0 pg   MCHC 33.5 30.0 - 36.0 g/dL   RDW 13.2 11.5 - 15.5 %   Platelets 313 150 - 400 K/uL  Basic metabolic panel     Status: Abnormal   Collection Time: 04/24/14  4:10 PM  Result Value Ref Range   Sodium 129 (L) 135 - 145 mmol/L   Potassium 4.4 3.5 - 5.1 mmol/L   Chloride 94 (L) 96 - 112 mmol/L   CO2 26 19 - 32 mmol/L   Glucose, Bld 107 (H) 70 - 99 mg/dL   BUN 22 6 - 23 mg/dL   Creatinine, Ser 0.72 0.50 - 1.10 mg/dL   Calcium 9.3 8.4 - 10.5 mg/dL   GFR calc non Af Amer 74 (L) >90 mL/min   GFR calc Af Amer 86 (L) >90 mL/min   Anion gap 9 5 - 15  I-stat troponin, ED (not at [facility])  Status: None   Collection Time: 04/24/14  4:26 PM  Result Value Ref Range   Troponin i, poc 0.00 0.00 - 0.08 ng/mL   Comment 3            UA ordered not obtained  Lab Results  Component Value Date   HGBA1C * 05/15/2009    7.1 (NOTE) The ADA recommends the following  therapeutic goal for glycemic control related to Hgb A1c measurement: Goal of therapy: <6.5 Hgb A1c  Reference: American Diabetes Association: Clinical Practice Recommendations 2010, Diabetes Care, 2010, 33: (Suppl  1).    CrCl cannot be calculated (Unknown ideal weight.).  BNP (last 3 results) No results for input(s): PROBNP in the last 8760 hours.  Other results:  I have pearsonaly reviewed this: ECG REPORT  Rate: 88  Rhythm: NSR ST&T Change: no ischemia   There were no vitals filed for this visit.   Cultures:    Component Value Date/Time   SDES URINE, CATHETERIZED 03/16/2013 1119   SPECREQUEST NONE 03/16/2013 1119   CULT  03/16/2013 1119    Multiple bacterial morphotypes present, none predominant. Suggest appropriate recollection if clinically indicated. Performed at [place] 03/17/2013 FINAL 03/16/2013 1119     Radiological Exams on Admission: Dg Chest 2 View  04/24/2014   CLINICAL DATA:  Fall 2 days ago, chest pain  EXAM: CHEST  2 VIEW  COMPARISON:  12/05/2013  FINDINGS: Cardiac shadow is stable. The lungs are well aerated bilaterally. No focal infiltrate or sizable effusion is seen. Chronic compression deformity is noted in the lower thoracic spine. Old rib fractures are noted bilaterally.  IMPRESSION: No acute abnormality noted.   Electronically Signed   By: Inez Catalina M.D.   On: 04/24/2014 14:53   Dg Ribs Unilateral Right  04/24/2014   CLINICAL DATA:  Initial evaluation for acute right-sided rib pain status post fall.  EXAM: RIGHT RIBS - 2 VIEW  COMPARISON:  Prior radiograph of the chest performed earlier on the same day.  FINDINGS: Dedicated views of the right ribs demonstrate no acute displaced rib fracture.  Visualized heart and lungs demonstrate no acute abnormality. Surgical clips overlie the neck. Spinal fixation hardware noted.  IMPRESSION: No acute displaced rib fracture identified.   Electronically Signed   By: Jeannine Boga M.D.    On: 04/24/2014 17:39    Chart has been reviewed  Assessment/Plan  81 yo F with hx of COPD, CAD, DM , HTN and dementia presents with mechanical fall and hyponatremia,  Present on Admission:  . Hypertension - continue home meds . Hyponatremia -She have had hyponatremia in the past, daughter reports poor PO intake and poor appetite. This has ben relatively chronic, will check urine electrolytes, orthostatics and UA , give gentle NS IVF, decreased water intake monitor Na . Leukocytosis - obtain UA no fever, suspect possibly being hemoconcentrated . Coronary artery disease - stable continue home meds . COPD (chronic obstructive pulmonary disease) - stable  . Dementia - stable watch for sundowning, fall precautions, given frequent falls will have PT /OT eval . Heart murmur - obtain echo   Prophylaxis: SCD , Protonix  CODE STATUS:   DNR/DNI  Other plan as per orders.  I have spent a total of 55 min on this admission  Jaxen Samples 04/24/2014, 6:51 PM  [facility]  Pager [phone]   after 2 AM please page floor coverage PA If 7AM-7PM, please contact the day team taking care of the patient  [email or web address]  Password [email or web address]

## 2014-04-25 DIAGNOSIS — R011 Cardiac murmur, unspecified: Secondary | ICD-10-CM | POA: Diagnosis not present

## 2014-04-25 DIAGNOSIS — F0390 Unspecified dementia without behavioral disturbance: Secondary | ICD-10-CM | POA: Diagnosis not present

## 2014-04-25 DIAGNOSIS — I150 Renovascular hypertension: Secondary | ICD-10-CM

## 2014-04-25 DIAGNOSIS — I25119 Atherosclerotic heart disease of native coronary artery with unspecified angina pectoris: Secondary | ICD-10-CM | POA: Diagnosis not present

## 2014-04-25 DIAGNOSIS — J418 Mixed simple and mucopurulent chronic bronchitis: Secondary | ICD-10-CM

## 2014-04-25 DIAGNOSIS — R0781 Pleurodynia: Secondary | ICD-10-CM | POA: Diagnosis not present

## 2014-04-25 DIAGNOSIS — E871 Hypo-osmolality and hyponatremia: Secondary | ICD-10-CM | POA: Diagnosis not present

## 2014-04-25 DIAGNOSIS — D72829 Elevated white blood cell count, unspecified: Secondary | ICD-10-CM | POA: Diagnosis not present

## 2014-04-25 DIAGNOSIS — E119 Type 2 diabetes mellitus without complications: Secondary | ICD-10-CM | POA: Diagnosis not present

## 2014-04-25 LAB — COMPREHENSIVE METABOLIC PANEL
ALT: 14 U/L (ref 0–35)
AST: 22 U/L (ref 0–37)
Albumin: 3.3 g/dL — ABNORMAL LOW (ref 3.5–5.2)
Alkaline Phosphatase: 69 U/L (ref 39–117)
Anion gap: 11 (ref 5–15)
BUN: 22 mg/dL (ref 6–23)
CO2: 24 mmol/L (ref 19–32)
CREATININE: 1.28 mg/dL — AB (ref 0.50–1.10)
Calcium: 8.4 mg/dL (ref 8.4–10.5)
Chloride: 96 mmol/L (ref 96–112)
GFR calc Af Amer: 42 mL/min — ABNORMAL LOW (ref >90)
GFR, EST NON AFRICAN AMERICAN: 36 mL/min — AB (ref >90)
Glucose, Bld: 127 mg/dL — ABNORMAL HIGH (ref 70–99)
POTASSIUM: 4.3 mmol/L (ref 3.5–5.1)
Sodium: 131 mmol/L — ABNORMAL LOW (ref 135–145)
TOTAL PROTEIN: 5.6 g/dL — AB (ref 6.0–8.3)
Total Bilirubin: 0.5 mg/dL (ref 0.3–1.2)

## 2014-04-25 LAB — GLUCOSE, CAPILLARY
Glucose-Capillary: 114 mg/dL — ABNORMAL HIGH (ref 70–99)
Glucose-Capillary: 146 mg/dL — ABNORMAL HIGH (ref 70–99)
Glucose-Capillary: 106 mg/dL — ABNORMAL HIGH (ref 70–99)
Glucose-Capillary: 130 mg/dL — ABNORMAL HIGH (ref 70–99)

## 2014-04-25 LAB — CBC
HCT: 33.4 % — ABNORMAL LOW (ref 36.0–46.0)
Hemoglobin: 11 g/dL — ABNORMAL LOW (ref 12.0–15.0)
MCH: 29.3 pg (ref 26.0–34.0)
MCHC: 32.9 g/dL (ref 30.0–36.0)
MCV: 89.1 fL (ref 78.0–100.0)
Platelets: 246 10*3/uL (ref 150–400)
RBC: 3.75 MIL/uL — ABNORMAL LOW (ref 3.87–5.11)
RDW: 13.5 % (ref 11.5–15.5)
WBC: 10.3 10*3/uL (ref 4.0–10.5)

## 2014-04-25 LAB — SODIUM, URINE, RANDOM: SODIUM UR: 30 meq/L

## 2014-04-25 LAB — PHOSPHORUS: PHOSPHORUS: 4.8 mg/dL — AB (ref 2.3–4.6)

## 2014-04-25 LAB — OSMOLALITY, URINE: OSMOLALITY UR: 416 mosm/kg (ref 390–1090)

## 2014-04-25 LAB — CREATININE, URINE, RANDOM: Creatinine, Urine: 81.2 mg/dL

## 2014-04-25 LAB — URINE CULTURE
Colony Count: NO GROWTH
Culture: NO GROWTH

## 2014-04-25 LAB — MAGNESIUM: MAGNESIUM: 2 mg/dL (ref 1.5–2.5)

## 2014-04-25 LAB — OSMOLALITY: Osmolality: 278 mOsm/kg (ref 275–300)

## 2014-04-25 LAB — TSH: TSH: 0.521 u[IU]/mL (ref 0.350–4.500)

## 2014-04-25 MED ORDER — ENOXAPARIN SODIUM 30 MG/0.3ML ~~LOC~~ SOLN
30.0000 mg | Freq: Every day | SUBCUTANEOUS | Status: DC
  Administered 2014-04-25: 30 mg via SUBCUTANEOUS
  Filled 2014-04-25 (×2): qty 0.3

## 2014-04-25 MED ORDER — HYDROMORPHONE HCL 1 MG/ML IJ SOLN
0.5000 mg | Freq: Once | INTRAMUSCULAR | Status: AC
  Administered 2014-04-25: 0.5 mg via INTRAVENOUS
  Filled 2014-04-25: qty 1

## 2014-04-25 MED ORDER — HYDROCODONE-ACETAMINOPHEN 5-325 MG PO TABS
1.0000 | ORAL_TABLET | Freq: Four times a day (QID) | ORAL | Status: DC | PRN
  Administered 2014-04-25 – 2014-04-26 (×5): 1 via ORAL
  Filled 2014-04-25 (×6): qty 1

## 2014-04-25 MED ORDER — METOPROLOL TARTRATE 25 MG PO TABS
25.0000 mg | ORAL_TABLET | Freq: Two times a day (BID) | ORAL | Status: DC
  Administered 2014-04-25 – 2014-04-26 (×2): 25 mg via ORAL
  Filled 2014-04-25 (×3): qty 1

## 2014-04-25 MED ORDER — SODIUM CHLORIDE 0.9 % IV SOLN
INTRAVENOUS | Status: AC
  Administered 2014-04-25 (×2): via INTRAVENOUS

## 2014-04-25 NOTE — Progress Notes (Signed)
Lori Long HWE:[id number] DOB: Apr 09, 1923 DOA: 04/24/2014 PCP: Marjorie Smolder, MD  Brief narrative:  81 y/o ? h/o Neurogenic claudication s/p Decompressive laminotomy L4 etc. Etc., Known COPD, Ty II DM, HTn, hypothyroidism, prior non cardiogenci CP admitted 04/24/14 c mechaniocal fall and worsening mental confusion. Patient lives alone at home-has had ~ 3 falls in 6 mo.  Usually takes care of herself.  Has hyponatremia dating back to 2013.  Not on thiazides.  Usually drinks ~ 1 gallon [!!] Milk qhs as cannot sleep.  Fell while tryint to get up off of porch on 3/15/1 and brought to ED for evalauation Na on admission 129     Past medical history-As per Problem list Chart reviewed as below- reviewed  Consultants:  none  Procedures:  none  Antibiotics:  nonoe   Subjective  Well No c/o No pain, n/v/sob Blurred or double vision No CP   Objective    Interim History:   Telemetry:    Objective: Filed Vitals:   04/24/14 1941 04/24/14 2137 04/24/14 2228 04/25/14 0639  BP: 143/59 156/58  127/59  Pulse: 74 72  64  Temp:  97.8 F (36.6 C)  97.8 F (36.6 C)  TempSrc:  Oral  Axillary  Resp: 18 18  18   Height:   5\' 3"  (1.6 m)   Weight:   69.7 kg (153 lb 10.6 oz)   SpO2: 93% 96%  100%    Intake/Output Summary (Last 24 hours) at 04/25/14 1051 Last data filed at 04/25/14 0900  Gross per 24 hour  Intake    180 ml  Output      0 ml  Net    180 ml    Exam:  General: EOMI, NCAT Cardiovascular: s1 s 2no m/r/g Respiratory: clear no added sound Abdomen:  Soft, NT/Nd Skin no LE edema Neuro intact  Data Reviewed: Basic Metabolic Panel:  Recent Labs Lab 04/24/14 1610 04/25/14 0535  NA 129* 131*  K 4.4 4.3  CL 94* 96  CO2 26 24  GLUCOSE 107* 127*  BUN 22 22  CREATININE 0.72 1.28*  CALCIUM 9.3 8.4  MG  --  2.0  PHOS  --  4.8*   Liver Function Tests:  Recent Labs Lab 04/25/14 0535  AST 22  ALT 14  ALKPHOS 69  BILITOT 0.5  PROT 5.6*    ALBUMIN 3.3*   No results for input(s): LIPASE, AMYLASE in the last 168 hours. No results for input(s): AMMONIA in the last 168 hours. CBC:  Recent Labs Lab 04/24/14 1610 04/25/14 0535  WBC 12.4* 10.3  HGB 13.7 11.0*  HCT 40.9 33.4*  MCV 86.8 89.1  PLT 313 246   Cardiac Enzymes: No results for input(s): CKTOTAL, CKMB, CKMBINDEX, TROPONINI in the last 168 hours. BNP: Invalid input(s): POCBNP CBG:  Recent Labs Lab 04/24/14 2151 04/25/14 0718  GLUCAP 140* 114*    No results found for this or any previous visit (from the past 240 hour(s)).   Studies:              All Imaging reviewed and is as per above notation   Scheduled Meds: . amLODipine  5 mg Oral q morning - 10a  . brimonidine  1 drop Both Eyes Q12H   And  . timolol  1 drop Both Eyes Q12H  . cloNIDine  0.1 mg Oral QHS  . docusate sodium  100 mg Oral Daily  . enoxaparin (LOVENOX) injection  40 mg Subcutaneous QHS  . gabapentin  1,000 mg Oral QHS  . gabapentin  500 mg Oral 2 times per day  . insulin aspart  0-15 Units Subcutaneous TID WC  . insulin aspart  0-5 Units Subcutaneous QHS  . irbesartan  300 mg Oral Daily  . latanoprost  1 drop Both Eyes QHS  . levothyroxine  100 mcg Oral QAC breakfast  . LORazepam  0.5 mg Oral QHS  . memantine  5 mg Oral BID  . metoprolol tartrate  50 mg Oral BID  . pantoprazole  40 mg Oral Daily  . pravastatin  40 mg Oral QPM  . sodium chloride  3 mL Intravenous Q12H   Continuous Infusions: . sodium chloride Stopped (04/25/14 1036)     Assessment/Plan:  Hyponatremia, Euvolemic -"tea-toast" potomania  Calc Serum OSM= 2*[129] +107/18 + 22/2.8  =258+13.5  =271    [Nl Serum OSM 280-295]  Osmolal gapPending  Measured Urine OSM= 416  Clinically no LE swelling + Measured Urine Sodium= 30 ? this rules out ECFV retention state   Will restrict PO fluids to 1200 cc and reassess in am Doubt Adrenal/thryoid issues but follow TSH and  Free Cortisol Follow ECHO-Euvolemic per my exam Will give concurrent NS 50 cc/hour Allow regular "extra salt" in diet Management wll be challening-She has drunk fluids like this "for a long time" per Daughter   Accidental Fall Hold ARB/Amlodipine metoprolol  50--->25 bid Continue Clonidine 0.1 mg qhs Keep on tele r/o arrythmia Has h/o Neuropathy from Surgery in the past which might contribute Get Orthostatics Consider Lower dose Namenda as AcHE inhibitors assosc. Occ. c falls PT/Ot to clear patient for home   ?COPD-monitor Never a smoker, husband was heavy smoker   Ty 2 Dm Monitor on SSI Home dose Glipizide XL 10 mg on hold   Hypothyroidism See above   Mild Dementia Continue Namenda 5 mg bid Contineu Ativan 0.5 qhs   Pain Continue Percocet 10-325 if prn/Norco--Care as this could contribute along wit Ativan to risk of falls Continue Gabapentin tid per home MAR   Code Status: full Family Communication:  Long discussion c famiy  Disposition Plan: up with therapy, Ambulate Potential d/c tomorrow   Verneita Griffes, MD  [facility] Pager [phone] 04/25/2014, 10:51 AM    LOS: 1 day

## 2014-04-25 NOTE — Evaluation (Signed)
Occupational Therapy Evaluation Patient Details Name: Lori Long MRN: [id number] DOB: 08-01-1923 Today's Date: 04/25/2014    History of Present Illness 81 yo female admitted with fall, R side pain from fall, hyponatremia. Hx of HTn, dm, sleep apnea, osteopenia, back pain-lamincectomy, macular degeneration, mild dementia.    Clinical Impression   Pt was admitted for the above. She was mod I for basic ADLs prior to admission and now needs up to total A for LB adls due to pain.  She needs min A to safely transfer to toilet.  Family will assist with adls at home.  OT will concentrate on safety with toileting in acute setting.  Recommend HHOT to follow up     Follow Up Recommendations  Home health OT;Supervision/Assistance - 24 hour    Equipment Recommendations  None recommended by OT    Recommendations for Other Services       Precautions / Restrictions Precautions Precautions: Fall Precaution Comments: + orthostatics with nursing this am Restrictions Weight Bearing Restrictions: No      Mobility Bed Mobility Overal bed mobility: Needs Assistance Bed Mobility: Supine to Sit     Supine to sit: Min assist;HOB elevated Sit to supine: Min assist   General bed mobility comments: assist for trunk getting OOB, assist for legs--performed to sidelying for back to bed  Transfers Overall transfer level: Needs assistance Equipment used: Rolling walker (2 wheeled) Transfers: Sit to/from Stand Sit to Stand: Min assist         General transfer comment: Assist to rise, stabilize, control descent.     Balance Overall balance assessment: Needs assistance;History of Falls         Standing balance support: Bilateral upper extremity supported;During functional activity Standing balance-Leahy Scale: Poor Standing balance comment: need RW for support/stability                            ADL Overall ADL's : Needs assistance/impaired     Grooming: Wash/dry  hands;Min guard;Standing                   Toilet Transfer: Minimal assistance;Ambulation;BSC   Toileting- Water quality scientist and Hygiene: Min guard;Sit to/from stand         General ADL Comments: Pt is limited with ADLs due to pain on R side of trunk and hip.  Based on clinical judgment, she needs min A for UB adls and max A for LB bathing, total A for LB dressing.  She was able to perform toilet hygiene.  Pt usually sponge bathes.  She needed safety cues to keep RW with her all the time as she let go and tried to step around it getting to toilet and tried to "park" it when getting back to bed.  Family can assist with adls as needed     Vision     Perception     Praxis      Pertinent Vitals/Pain Pain Assessment: Faces Faces Pain Scale: Hurts whole lot Pain Location: R side Pain Descriptors / Indicators: Crying;Moaning Pain Intervention(s): Limited activity within patient's tolerance;Monitored during session;Premedicated before session;Repositioned (RN had just brought pain medicine; pt needed the bathroom)     Hand Dominance     Extremity/Trunk Assessment Upper Extremity Assessment Upper Extremity Assessment: Generalized weakness      Cervical / Trunk Assessment Cervical / Trunk Assessment: Normal   Communication Communication Communication: No difficulties   Cognition Arousal/Alertness: Awake/alert Behavior During Therapy: WFL for  tasks assessed/performed Overall Cognitive Status: History of cognitive impairments - at baseline       Memory: Decreased short-term memory             General Comments       Exercises       Shoulder Instructions      Home Living Family/patient expects to be discharged to:: Private residence Living Arrangements: Alone Available Help at Discharge: Family;Available 24 hours/day Type of Home: House             Bathroom Shower/Tub: Walk-in Corporate treasurer Toilet: Standard     Home Equipment: Bedside  commode;Shower seat   Additional Comments: family is rotating to provide 24/7      Prior Functioning/Environment Level of Independence: Needs assistance  Gait / Transfers Assistance Needed: supervision level. pt has difficulty remembering to use assistive devices ADL's / Homemaking Assistance Needed: meals, medicine management        OT Diagnosis: Acute pain;Generalized weakness   OT Problem List: Decreased strength;Decreased activity tolerance;Impaired balance (sitting and/or standing);Decreased cognition;Decreased safety awareness;Pain   OT Treatment/Interventions: Self-care/ADL training;DME and/or AE instruction;Patient/family education;Balance training    OT Goals(Current goals can be found in the care plan section) Acute Rehab OT Goals Patient Stated Goal: get back to baseline; decreased pain OT Goal Formulation: With patient/family Time For Goal Achievement: 05/02/14 Potential to Achieve Goals: Good ADL Goals Pt Will Transfer to Toilet: with min guard assist;ambulating;bedside commode Pt Will Perform Toileting - Clothing Manipulation and hygiene: with supervision;sit to/from stand Additional ADL Goal #1: pt will keep walker around body when transferring/walking with min cues   OT Frequency: Min 2X/week   Barriers to D/C:            Co-evaluation              End of Session    Activity Tolerance: Patient limited by pain Patient left: in bed;with call bell/phone within reach;with bed alarm set;with family/visitor present   Time: 0272-5366 OT Time Calculation (min): 17 min Charges:  OT General Charges $OT Visit: 1 Procedure OT Evaluation $Initial OT Evaluation Tier I: 1 Procedure G-Codes: OT G-codes **NOT FOR INPATIENT CLASS** Functional Assessment Tool Used: clinical judgment and observation Functional Limitation: Self care Self Care Current Status (Y4034): At least 80 percent but less than 100 percent impaired, limited or restricted Self Care Goal Status  (V4259): At least 1 percent but less than 20 percent impaired, limited or restricted  Lori Long 04/25/2014, 4:09 PM Lesle Chris, OTR/L [phone] 04/25/2014

## 2014-04-25 NOTE — Progress Notes (Signed)
   04/25/14 1217  PT Time Calculation  PT Start Time (ACUTE ONLY) 1147  PT Stop Time (ACUTE ONLY) 1206  PT Time Calculation (min) (ACUTE ONLY) 19 min  PT G-Codes **NOT FOR INPATIENT CLASS**  Functional Assessment Tool Used (clinical judgement)  Functional Limitation Mobility: Walking and moving around  Mobility: Walking and Moving Around Current Status (Z5015) CJ  Mobility: Walking and Moving Around Goal Status (A6825) CI  PT General Charges  $$ ACUTE PT VISIT 1 Procedure  PT Evaluation  $Initial PT Evaluation Tier I 1 Procedure   Weston Anna, MPT [phone]

## 2014-04-25 NOTE — Progress Notes (Signed)
  Echocardiogram 2D Echocardiogram has been performed.  Darlina Sicilian M 04/25/2014, 3:06 PM

## 2014-04-25 NOTE — Progress Notes (Signed)
UR complete 

## 2014-04-25 NOTE — Evaluation (Addendum)
Physical Therapy Evaluation Patient Details Name: Lori Long MRN: [id number] DOB: September 19, 1923 Today's Date: 04/25/2014   History of Present Illness  81 yo female admitted with fall, R side pain from fall, hyponatremia. Hx of HTn, dm, sleep apnea, osteopenia, back pain-lamincectomy, macular degeneration, mild dementia.   Clinical Impression  On eval, pt required Min assist for mobility-able to ambulate ~135 feet with RW. Mobility (especially bed mobility) limited by pain on R side sustained from fall. Pt denied dizziness during session (+ orthostatics this am with nursing). Daughter present to discuss d/c plan-states daughter rotate providing supervision for pt-plan is to return home. Recommend HHPT at discharge.     Follow Up Recommendations Home health PT;Supervision/Assistance - 24 hour    Equipment Recommendations  None recommended by PT    Recommendations for Other Services OT consult     Precautions / Restrictions Precautions Precautions: Fall Precaution Comments: + orthostatics with nursing this am Restrictions Weight Bearing Restrictions: No      Mobility  Bed Mobility Overal bed mobility: Needs Assistance Bed Mobility: Supine to Sit     Supine to sit: HOB elevated;Min assist     General bed mobility comments: Assist for trunk to upright. Increased time. Difficulty due to R side pain  Transfers Overall transfer level: Needs assistance Equipment used: Rolling walker (2 wheeled) Transfers: Sit to/from Stand Sit to Stand: Min assist Stand pivot: Min assist, bed>bsc with RW.         General transfer comment: Assist to rise, stabilize, control descent.   Ambulation/Gait Ambulation/Gait assistance: Min assist   Assistive device: Rolling walker (2 wheeled) Gait Pattern/deviations: Step-through pattern     General Gait Details: assist to stabilize and to maneuver with walker. Intermittent groaning while ambulating. Pt denied dizziness. Tolerated distance  well.   Stairs            Wheelchair Mobility    Modified Rankin (Stroke Patients Only)       Balance Overall balance assessment: Needs assistance;History of Falls         Standing balance support: Bilateral upper extremity supported;During functional activity Standing balance-Leahy Scale: Poor Standing balance comment: need RW for support/stability                             Pertinent Vitals/Pain Pain Assessment: Faces Faces Pain Scale: Hurts even more Pain Location: R side  Pain Descriptors / Indicators: Moaning;Grimacing Pain Intervention(s): Limited activity within patient's tolerance;Repositioned    Home Living Family/patient expects to be discharged to:: Private residence Living Arrangements: Alone Available Help at Discharge: Family;Available 24 hours/day (daughters rotate) Type of Home: House         Home Equipment: Walker - 2 wheels;Cane - single point      Prior Function Level of Independence: Needs assistance   Gait / Transfers Assistance Needed: supervision level. pt has difficulty remembering to use assistive devices  ADL's / Homemaking Assistance Needed: meals, medicine management        Hand Dominance        Extremity/Trunk Assessment               Lower Extremity Assessment: Generalized weakness      Cervical / Trunk Assessment: Normal  Communication   Communication: No difficulties  Cognition Arousal/Alertness: Lethargic Behavior During Therapy: WFL for tasks assessed/performed Overall Cognitive Status: History of cognitive impairments - at baseline       Memory: Decreased short-term memory  General Comments      Exercises        Assessment/Plan    PT Assessment Patient needs continued PT services  PT Diagnosis Difficulty walking;Generalized weakness;Acute pain   PT Problem List Decreased cognition;Decreased strength;Decreased activity tolerance;Decreased balance;Decreased  mobility;Obesity;Decreased knowledge of use of DME;Pain;Decreased safety awareness  PT Treatment Interventions DME instruction;Gait training;Functional mobility training;Therapeutic activities;Therapeutic exercise;Patient/family education;Balance training   PT Goals (Current goals can be found in the Care Plan section) Acute Rehab PT Goals Patient Stated Goal: none stated.  PT Goal Formulation: With patient/family Time For Goal Achievement: 05/09/14 Potential to Achieve Goals: Good    Frequency Min 3X/week   Barriers to discharge        Co-evaluation               End of Session Equipment Utilized During Treatment: Gait belt Activity Tolerance: Patient tolerated treatment well;Patient limited by pain Patient left: in chair;with call bell/phone within reach;with family/visitor present. Daughter in room-instructed her to notify nursing if she leaves. Daughter stated she will stay until her sister arrives.            Time: 1856-3149 PT Time Calculation (min) (ACUTE ONLY): 19 min   Charges:   PT Evaluation $Initial PT Evaluation Tier I: 1 Procedure     PT G Codes:        Weston Anna, MPT Pager: [phone]

## 2014-04-26 DIAGNOSIS — E119 Type 2 diabetes mellitus without complications: Secondary | ICD-10-CM | POA: Diagnosis not present

## 2014-04-26 DIAGNOSIS — R0781 Pleurodynia: Secondary | ICD-10-CM | POA: Diagnosis not present

## 2014-04-26 DIAGNOSIS — E871 Hypo-osmolality and hyponatremia: Secondary | ICD-10-CM | POA: Diagnosis not present

## 2014-04-26 DIAGNOSIS — R011 Cardiac murmur, unspecified: Secondary | ICD-10-CM | POA: Diagnosis not present

## 2014-04-26 DIAGNOSIS — D72829 Elevated white blood cell count, unspecified: Secondary | ICD-10-CM | POA: Diagnosis not present

## 2014-04-26 LAB — BASIC METABOLIC PANEL
ANION GAP: 3 — AB (ref 5–15)
Anion gap: 7 (ref 5–15)
BUN: 25 mg/dL — ABNORMAL HIGH (ref 6–23)
BUN: 24 mg/dL — ABNORMAL HIGH (ref 6–23)
CALCIUM: 8.6 mg/dL (ref 8.4–10.5)
CHLORIDE: 102 mmol/L (ref 96–112)
CO2: 21 mmol/L (ref 19–32)
CO2: 26 mmol/L (ref 19–32)
CREATININE: 0.89 mg/dL (ref 0.50–1.10)
Calcium: 8.4 mg/dL (ref 8.4–10.5)
Chloride: 103 mmol/L (ref 96–112)
Creatinine, Ser: 1.03 mg/dL (ref 0.50–1.10)
GFR calc Af Amer: 55 mL/min — ABNORMAL LOW (ref >90)
GFR calc Af Amer: 65 mL/min — ABNORMAL LOW (ref >90)
GFR calc non Af Amer: 47 mL/min — ABNORMAL LOW (ref >90)
GFR calc non Af Amer: 56 mL/min — ABNORMAL LOW (ref >90)
Glucose, Bld: 117 mg/dL — ABNORMAL HIGH (ref 70–99)
Glucose, Bld: 138 mg/dL — ABNORMAL HIGH (ref 70–99)
Potassium: 5.1 mmol/L (ref 3.5–5.1)
Potassium: 4.6 mmol/L (ref 3.5–5.1)
SODIUM: 131 mmol/L — AB (ref 135–145)
Sodium: 131 mmol/L — ABNORMAL LOW (ref 135–145)

## 2014-04-26 LAB — GLUCOSE, CAPILLARY
GLUCOSE-CAPILLARY: 157 mg/dL — AB (ref 70–99)
Glucose-Capillary: 118 mg/dL — ABNORMAL HIGH (ref 70–99)
Glucose-Capillary: 99 mg/dL (ref 70–99)

## 2014-04-26 MED ORDER — ENOXAPARIN SODIUM 40 MG/0.4ML ~~LOC~~ SOLN
40.0000 mg | Freq: Every day | SUBCUTANEOUS | Status: DC
  Filled 2014-04-26: qty 0.4

## 2014-04-26 MED ORDER — VALSARTAN 320 MG PO TABS: 160.0000 mg | ORAL_TABLET | Freq: Every morning | ORAL | Status: DC

## 2014-04-26 MED ORDER — GABAPENTIN 250 MG/5ML PO SOLN
300.0000 mg | ORAL | Status: DC
  Administered 2014-04-26: 300 mg via ORAL
  Filled 2014-04-26 (×2): qty 6

## 2014-04-26 MED ORDER — GABAPENTIN 250 MG/5ML PO SOLN
500.0000 mg | Freq: Every day | ORAL | Status: DC
  Filled 2014-04-26: qty 10

## 2014-04-26 NOTE — Discharge Summary (Signed)
Physician Discharge Summary  Lori Long EXH:[id number] DOB: 1923-08-11 DOA: 04/24/2014  PCP: Marjorie Smolder, MD  Admit date: 04/24/2014 Discharge date: 04/26/2014  Time spent: 45 minutes  Recommendations for Outpatient Follow-up:   1.  recommend basic metabolic panel in about one week  2.  recommend down titration of valsartan 320 --> 160 mg   3.  This admission metoprolol changed 50 mg- -->25  2X per day 4. As patient had mild acute kidney injury during this hospital stay and Will need further medication management as an outpatient. 5.  recommend either down titration or discontinuation of Namenda as an outpatient - anticholinergic medications occasionally associated with falls  In the elderly 6.  Consider free cortisol/ ACTH stimulation test to rule out adrenal insufficiency although this is very unlikely   Discharge Diagnoses:  Active Problems:   Coronary artery disease   Hypertension   Hyponatremia   Leukocytosis   DM II (diabetes mellitus, type II), controlled   COPD (chronic obstructive pulmonary disease)   Dementia   Heart murmur   Discharge Condition:  good  Diet recommendation:  Regular salt diet-do not sodium restricted as she has hyponatremia  Filed Weights   04/24/14 2228  Weight: 69.7 kg (153 lb 10.6 oz)    History of present illness:   81 y/o ? h/o Neurogenic claudication s/p Decompressive laminotomy L4 etc. Etc., Known COPD, Ty II DM, HTn, hypothyroidism, prior non cardiogenci CP admitted 04/24/14 c mechaniocal fall and worsening mental confusion. Patient lives alone at home-has had ~ 3 falls in 6 mo. Usually takes care of herself. Has hyponatremia dating back to 2013. Not on thiazides. Usually drinks ~ 1 gallon [!!] Milk qhs as cannot sleep. Fell while tryint to get up off of porch on 3/15/1 and brought to ED for evalauation Na on admission [facility] Course:    Hyponatremia, Euvolemic -"tea-toast" potomania  Calc Serum OSM= 2*[129]  +107/18 + 22/2.8  =258+13.5  =271 [Nl Serum OSM 280-295]  Osmolal gap278-271=6  Measured Urine OSM= 416  Clinically no LE swelling + Measured Urine Sodium= 30 ? this rules out ECFV retention state    during hospital stay we did restrict fluids to to 1200 cc  TSH was within normal limits at 0.5-1 -we did not obtain Free Cortisol  ECHO this admission showed EF 38-10% weight 1 diastolic dysfunction PA peak pressure 32 mmHg  Was on saline 50 cc per hour Allow regular "extra salt" in diet   Accidental Fall Hold ARB/Amlodipine metoprolol  50--->25 bid Continue Clonidine 0.1 mg qhs  no arrhythmias on telemetry Has h/o Neuropathy from Surgery in the past which might contribute  orthostatics were negative Consider Lower dose Namenda as AcHE inhibitors assosc. Occ. c falls   ?COPD-monitor Never a smoker, husband was heavy smoker   Ty 2 Dm Monitor on SSI Home dose Glipizide XL 10 mg on hold   Hypothyroidism See above   Mild Dementia Continue Namenda 5 mg bid Contineu Ativan 0.5 qhs   Pain Continue Percocet 10-325 if prn/Norco--Care as this could contribute along wit Ativan to risk of falls Continue Gabapentin tid per home [facility]    Discharge Exam: Filed Vitals:   04/26/14 0603  BP: 132/57  Pulse: 64  Temp: 97.5 F (36.4 C)  Resp:     General:  EOMI NCAT Cardiovascular:  S1-S2 no murmur rub or gallop Respiratory:  Clinically clear  Discharge Instructions   Discharge Instructions    Diet general    Complete  by:  As directed      Discharge instructions    Complete by:  As directed   Please continue to limit your fluid intake  I would recommend that your regular physician and get lab work done within about 1-2 weeks  I would recommend that  U take only a half tablet of your valsartan  [blood pressure medication] because of your kidneys and the effect on the   please follow-up with your primary care physician for  further instructions as she may need to make further adjustment to her medications  please eat a regular diet with the usual amount of salt as we had discussed during this hospital stay that low-salt levels in your blood can cause you to  feel dizzy and occasionally can contribute 2 falls in the elderly     Increase activity slowly    Complete by:  As directed           Current Discharge Medication List    CONTINUE these medications which have CHANGED   Details  valsartan (DIOVAN) 320 MG tablet Take 0.5 tablets (160 mg total) by mouth every morning.      CONTINUE these medications which have NOT CHANGED   Details  acetaminophen (TYLENOL) 650 MG CR tablet Take 1,300 mg by mouth 2 (two) times daily.    amLODipine (NORVASC) 5 MG tablet Take 5 mg by mouth every morning.    brimonidine-timolol (COMBIGAN) 0.2-0.5 % ophthalmic solution Place 1 drop into both eyes every 12 (twelve) hours.    cholecalciferol (VITAMIN D) 1000 UNITS tablet Take 2,000 Units by mouth every morning.     cloNIDine (CATAPRES) 0.1 MG tablet Take 1 tablet by mouth at bedtime. Refills: 1    docusate sodium (COLACE) 100 MG capsule Take 100 mg by mouth daily.    fish oil-omega-3 fatty acids 1000 MG capsule Take 1 g by mouth every morning.     gabapentin (NEURONTIN) 250 MG/5ML solution Take 500-1,000 mg by mouth 3 (three) times daily. Take 64ml= 500mg  in the morning and afternoon, and take 87ml or 1000mg  at bedtime    HYDROcodone-acetaminophen (NORCO) 10-325 MG per tablet Take 1 tablet by mouth every 8 (eight) hours as needed. Refills: 0    levothyroxine (SYNTHROID, LEVOTHROID) 100 MCG tablet Take 100 mcg by mouth daily with breakfast.     LORazepam (ATIVAN) 0.5 MG tablet Take 1 tablet by mouth at bedtime. Refills: 0    memantine (NAMENDA) 5 MG tablet Take 5 mg by mouth 2 (two) times daily.    metoprolol tartrate (LOPRESSOR) 25 MG tablet Take 50 mg by mouth 2 (two) times daily.     Multiple Vitamins-Minerals  (PRESERVISION/LUTEIN PO) Take 1 tablet by mouth every morning.     omeprazole (PRILOSEC) 20 MG capsule Take 40 mg by mouth daily.    pravastatin (PRAVACHOL) 40 MG tablet Take 40 mg by mouth every evening.     vitamin B-12 (CYANOCOBALAMIN) 1000 MCG tablet Take 2,000 mcg by mouth every morning.     HYDROcodone-acetaminophen (NORCO/VICODIN) 5-325 MG per tablet Take 1-2 tablets by mouth every 4 (four) hours as needed. Qty: 12 tablet, Refills: 0    oxyCODONE-acetaminophen (PERCOCET) 10-325 MG per tablet Take 1 tablet by mouth every 6 (six) hours as needed for pain. Qty: 15 tablet, Refills: 0      STOP taking these medications     bimatoprost (LUMIGAN) 0.03 % ophthalmic solution      glipiZIDE (GLUCOTROL XL) 10 MG 24 hr tablet  Propylene Glycol 0.6 % SOLN      ciprofloxacin (CIPRO) 500 MG tablet      metroNIDAZOLE (FLAGYL) 500 MG tablet      ondansetron (ZOFRAN) 4 MG tablet        Allergies  Allergen Reactions  . Clonidine Derivatives     Local reaction to patch.  . Ezetimibe-Simvastatin Other (See Comments)    Leg pain from Vytorin  . Lipitor [Atorvastatin Calcium] Other (See Comments)    Couldn't tolerate  . Metformin And Related Diarrhea  . Morphine And Related Other (See Comments)    hallucination  . Codeine Rash      The results of significant diagnostics from this hospitalization (including imaging, microbiology, ancillary and laboratory) are listed below for reference.    Significant Diagnostic Studies: Dg Chest 2 View  04/24/2014   CLINICAL DATA:  Fall 2 days ago, chest pain  EXAM: CHEST  2 VIEW  COMPARISON:  12/05/2013  FINDINGS: Cardiac shadow is stable. The lungs are well aerated bilaterally. No focal infiltrate or sizable effusion is seen. Chronic compression deformity is noted in the lower thoracic spine. Old rib fractures are noted bilaterally.  IMPRESSION: No acute abnormality noted.   Electronically Signed   By: Inez Catalina M.D.   On: 04/24/2014 14:53    Dg Ribs Unilateral Right  04/24/2014   CLINICAL DATA:  Initial evaluation for acute right-sided rib pain status post fall.  EXAM: RIGHT RIBS - 2 VIEW  COMPARISON:  Prior radiograph of the chest performed earlier on the same day.  FINDINGS: Dedicated views of the right ribs demonstrate no acute displaced rib fracture.  Visualized heart and lungs demonstrate no acute abnormality. Surgical clips overlie the neck. Spinal fixation hardware noted.  IMPRESSION: No acute displaced rib fracture identified.   Electronically Signed   By: Jeannine Boga M.D.   On: 04/24/2014 17:39   Ct Head Wo Contrast  04/24/2014   CLINICAL DATA:  81 year old female with a history of fall  EXAM: CT HEAD WITHOUT CONTRAST  TECHNIQUE: Contiguous axial images were obtained from the base of the skull through the vertex without intravenous contrast.  COMPARISON:  02/16/2011, 08/28/2008 MRI head 03/20/2010  FINDINGS: Unremarkable appearance of the calvarium without acute fracture or aggressive lesion.  Unremarkable appearance of the scalp soft tissues.  Unremarkable appearance of the bilateral orbits. Bilateral lens extraction.  Mastoid air cells are clear.  No significant paranasal sinus disease, with trace mucoperiosteal thickening of the right maxillary sinus.  No acute intracranial hemorrhage, midline shift, or mass effect.  Senescent brain volume loss. Unremarkable configuration the ventricular system.  Gray-white differentiation maintained.  Atherosclerotic calcification of the intracranial vasculature.  IMPRESSION: No CT evidence of acute intracranial abnormality.  Mild brain volume loss with intracranial atherosclerosis.  Signed,  Dulcy Fanny. Earleen Newport, DO  [facility]  [facility]   Electronically Signed   By: Corrie Mckusick D.O.   On: 04/24/2014 19:45    Microbiology: Recent Results (from the past 240 hour(s))  Urine culture     Status: None   Collection Time: 04/24/14  8:19 PM    Result Value Ref Range Status   Specimen Description URINE, CLEAN CATCH  Final   Special Requests NONE  Final   Colony Count NO GROWTH Performed at [facility]   Final   Culture NO GROWTH Performed at [facility]   Final   Report Status 04/25/2014 FINAL  Final     Labs: Basic  Metabolic Panel:  Recent Labs Lab 04/24/14 1610 04/25/14 0535 04/26/14 0550 04/26/14 1345  NA 129* 131* 131* 131*  K 4.4 4.3 5.1 4.6  CL 94* 96 103 102  CO2 26 24 21 26   GLUCOSE 107* 127* 117* 138*  BUN 22 22 25* 24*  CREATININE 0.72 1.28* 1.03 0.89  CALCIUM 9.3 8.4 8.6 8.4  MG  --  2.0  --   --   PHOS  --  4.8*  --   --    Liver Function Tests:  Recent Labs Lab 04/25/14 0535  AST 22  ALT 14  ALKPHOS 69  BILITOT 0.5  PROT 5.6*  ALBUMIN 3.3*   No results for input(s): LIPASE, AMYLASE in the last 168 hours. No results for input(s): AMMONIA in the last 168 hours. CBC:  Recent Labs Lab 04/24/14 1610 04/25/14 0535  WBC 12.4* 10.3  HGB 13.7 11.0*  HCT 40.9 33.4*  MCV 86.8 89.1  PLT 313 246   Cardiac Enzymes: No results for input(s): CKTOTAL, CKMB, CKMBINDEX, TROPONINI in the last 168 hours. BNP: BNP (last 3 results) No results for input(s): BNP in the last 8760 hours.  ProBNP (last 3 results) No results for input(s): PROBNP in the last 8760 hours.  CBG:  Recent Labs Lab 04/25/14 1630 04/25/14 2023 04/26/14 0713 04/26/14 1134 04/26/14 1626  GLUCAP 106* 130* 118* 99 157*       Signed:  Christen Wardrop, [place]  [facility] 04/26/2014, 4:47 PM

## 2014-04-26 NOTE — Progress Notes (Signed)
CARE MANAGEMENT NOTE 04/26/2014  Patient:  Lori Long, Lori Long   Account Number:  [id number]  Date Initiated:  04/25/2014  Documentation initiated by:  Edwyna Shell  Subjective/Objective Assessment:   81 yo female admitted s/p fall and hyponatremic     Action/Plan:   discharge planning   Anticipated DC Date:  04/26/2014   Anticipated DC Plan:  [place]  CM consult      [facility] [employer] Choice  HOME HEALTH   Choice offered to / List presented to:  C-4 Adult Children        HH arranged  HH-1 RN  HH-10 DISEASE MANAGEMENT  HH-2 PT  HH-3 OT  [place]      [place].   Status of service:  Completed, signed off Medicare Important Message given?   (If response is "NO", the following Medicare IM given date fields will be blank) Date Medicare IM given:   Medicare IM given by:   Date Additional Medicare IM given:   Additional Medicare IM given by:    Discharge Disposition:  [place]  Per UR Regulation:    If discussed at Long Length of Stay Meetings, dates discussed:    Comments:  04/26/14 Edwyna Shell RN BSn CM [phone] Spoke with patient and daughter in the room, [name] stated that she stays with the patient and the patient confirmed that she is never alone. The patient stated that if her daughter is unable to stay with her she has church members that come and sit with her. With permission contacted the patient daughter and POA, [name], and she stated that she had no concerns about the patient returning home with [facility] services. The patient and daughter chose [facility] and Octavia Bruckner , the [place] rep, was provided with the referral. The patient stated that she already has a rolling walker, bedside commode, and shower chair in the home.

## 2014-04-26 NOTE — Progress Notes (Signed)
CARE MANAGEMENT NOTE 04/26/2014  Patient:  Lori Long, Lori Long   Account Number:  [id number]  Date Initiated:  04/25/2014  Documentation initiated by:  Edwyna Shell  Subjective/Objective Assessment:   81 yo female admitted s/p fall and hyponatremic     Action/Plan:   discharge planning   Anticipated DC Date:  04/26/2014   Anticipated DC Plan:  [place]  CM consult      [facility] Choice  HOME HEALTH   Choice offered to / List presented to:  C-4 Adult Children        HH arranged  HH-1 RN  HH-10 DISEASE MANAGEMENT  HH-2 PT  HH-3 OT  [place]      [place].   Status of service:  Completed, signed off Medicare Important Message given?   (If response is "NO", the following Medicare IM given date fields will be blank) Date Medicare IM given:   Medicare IM given by:   Date Additional Medicare IM given:   Additional Medicare IM given by:    Discharge Disposition:  [place]  Per UR Regulation:    If discussed at Long Length of Stay Meetings, dates discussed:    Comments:  04/26/14 Edwyna Shell RN BSN CM [phone] Received call from patient PCP Dr. Inda Merlin stating that the granddaughter was concerned about the patient dc home. Explained that the patient amblated 135 feet with a rolling walker and PT recommended HH PT. Explained the [facility] services that have been put in place and Dr. Inda Merlin was appreciaitve. The contacted the granddaughter [name] at [phone] and provided follow up regarding the conversation with Dr. Inda Merlin and [facility] services arranged. [name] verbalized understanding and had no questions or concerns.  04/26/14 Edwyna Shell RN BSn CM [phone] Spoke with patient and daughter in the room, Almira stated that she stays with the patient and the patient confirmed that she is never alone. The patient stated that if her daughter is unable to stay with her she has church  members that come and sit with her. With permission contacted the patient daughter and POA, [name], and she stated that she had no concerns about the patient returning home with [facility] services. The patient and daughter chose [facility] and Octavia Bruckner , the [place] rep, was provided with the referral. The patient stated that she already has a rolling walker, bedside commode, and shower chair in the home.

## 2014-04-26 NOTE — Progress Notes (Signed)
Physical Therapy Treatment Patient Details Name: JADALYN OLIVERI MRN: [id number] DOB: 1923-11-24 Today's Date: 04/26/2014    History of Present Illness 81 yo female admitted with fall, R side pain from fall, hyponatremia. Hx of HTn, dm, sleep apnea, osteopenia, back pain-lamincectomy, macular degeneration, mild dementia.     PT Comments    Pt mobilizing fairly well. Limited by R side trunk pain especially with bed mobility. Plan is possibly for d/c home on today.   Follow Up Recommendations  Home health PT;Supervision/Assistance - 24 hour     Equipment Recommendations  None recommended by PT    Recommendations for Other Services       Precautions / Restrictions Precautions Precautions: Fall Precaution Comments: R side trunk pain Restrictions Weight Bearing Restrictions: No    Mobility  Bed Mobility Overal bed mobility: Needs Assistance Bed Mobility: Supine to Sit;Sit to Sidelying     Supine to sit: Min assist;HOB elevated   Sit to sidelying: Min assist General bed mobility comments: assist for trunk getting OOB, assist for legs--performed to sidelying for back to bed. Increased time and effort for pt.  Transfers Overall transfer level: Needs assistance Equipment used: Rolling walker (2 wheeled) Transfers: Sit to/from Stand Sit to Stand: Min guard         General transfer comment: close guard for safety. VCs safety, hand placement.   Ambulation/Gait Ambulation/Gait assistance: Min guard Ambulation Distance (Feet): 150 Feet Assistive device: Rolling walker (2 wheeled) Gait Pattern/deviations: Step-through pattern;Trunk flexed     General Gait Details: close guard for safety. Pt groaning and making therapist aware of increased pain. Pt denied lightheadedness/dizziness.    Stairs            Wheelchair Mobility    Modified Rankin (Stroke Patients Only)       Balance                                    Cognition  Arousal/Alertness: Awake/alert Behavior During Therapy: WFL for tasks assessed/performed Overall Cognitive Status: History of cognitive impairments - at baseline                      Exercises      General Comments        Pertinent Vitals/Pain Pain Assessment: Faces Faces Pain Scale: Hurts whole lot Pain Location: R side Pain Descriptors / Indicators: Moaning;Grimacing;Guarding Pain Intervention(s): Monitored during session;Repositioned;Patient requesting pain meds-RN notified    Home Living                      Prior Function            PT Goals (current goals can now be found in the care plan section) Progress towards PT goals: Progressing toward goals (slowly-limited by pain)    Frequency  Min 3X/week    PT Plan Current plan remains appropriate    Co-evaluation             End of Session Equipment Utilized During Treatment: Gait belt Activity Tolerance: Patient limited by pain Patient left: in bed;with call bell/phone within reach;with bed alarm set;with family/visitor present     Time: 1050-1108 PT Time Calculation (min) (ACUTE ONLY): 18 min  Charges:  $Gait Training: 8-22 mins                    G Codes:  Functional Assessment Tool  Used: clinical judgement Functional Limitation: Mobility: Walking and moving around Mobility: Walking and Moving Around Current Status [phone]): At least 20 percent but less than 40 percent impaired, limited or restricted Mobility: Walking and Moving Around Goal Status [phone]): At least 1 percent but less than 20 percent impaired, limited or restricted Mobility: Walking and Moving Around Discharge Status [phone]): At least 20 percent but less than 40 percent impaired, limited or restricted   Weston Anna, MPT Pager: [phone]

## 2014-06-26 ENCOUNTER — Ambulatory Visit
Admission: RE | Admit: 2014-06-26 | Discharge: 2014-06-26 | Disposition: A | Discharge status: Home / Self Care | Payer: 59 | Source: Ambulatory Visit | Attending: Family Medicine | Admitting: Family Medicine

## 2014-06-26 ENCOUNTER — Other Ambulatory Visit: Payer: Self-pay | Admitting: Family Medicine

## 2014-06-26 DIAGNOSIS — T1490XA Injury, unspecified, initial encounter: Secondary | ICD-10-CM

## 2014-07-20 ENCOUNTER — Emergency Department (HOSPITAL_COMMUNITY): Payer: Medicare Other

## 2014-07-20 ENCOUNTER — Encounter (HOSPITAL_COMMUNITY): Payer: Self-pay | Admitting: *Deleted

## 2014-07-20 ENCOUNTER — Inpatient Hospital Stay (HOSPITAL_COMMUNITY)
Admission: EM | Admit: 2014-07-20 | Discharge: 2014-07-23 | DRG: 194 | Disposition: A | Discharge status: Home / Self Care | Payer: Medicare Other | Attending: Internal Medicine | Admitting: Internal Medicine

## 2014-07-20 DIAGNOSIS — E780 Pure hypercholesterolemia: Secondary | ICD-10-CM | POA: Diagnosis present

## 2014-07-20 DIAGNOSIS — G4730 Sleep apnea, unspecified: Secondary | ICD-10-CM | POA: Diagnosis present

## 2014-07-20 DIAGNOSIS — E869 Volume depletion, unspecified: Secondary | ICD-10-CM | POA: Diagnosis present

## 2014-07-20 DIAGNOSIS — E039 Hypothyroidism, unspecified: Secondary | ICD-10-CM | POA: Diagnosis present

## 2014-07-20 DIAGNOSIS — J449 Chronic obstructive pulmonary disease, unspecified: Secondary | ICD-10-CM | POA: Diagnosis present

## 2014-07-20 DIAGNOSIS — Z833 Family history of diabetes mellitus: Secondary | ICD-10-CM

## 2014-07-20 DIAGNOSIS — J189 Pneumonia, unspecified organism: Principal | ICD-10-CM | POA: Diagnosis present

## 2014-07-20 DIAGNOSIS — F0390 Unspecified dementia without behavioral disturbance: Secondary | ICD-10-CM | POA: Diagnosis present

## 2014-07-20 DIAGNOSIS — I10 Essential (primary) hypertension: Secondary | ICD-10-CM | POA: Diagnosis present

## 2014-07-20 DIAGNOSIS — Z8711 Personal history of peptic ulcer disease: Secondary | ICD-10-CM

## 2014-07-20 DIAGNOSIS — R05 Cough: Secondary | ICD-10-CM | POA: Diagnosis present

## 2014-07-20 DIAGNOSIS — E119 Type 2 diabetes mellitus without complications: Secondary | ICD-10-CM | POA: Diagnosis present

## 2014-07-20 DIAGNOSIS — E871 Hypo-osmolality and hyponatremia: Secondary | ICD-10-CM | POA: Diagnosis present

## 2014-07-20 DIAGNOSIS — Y95 Nosocomial condition: Secondary | ICD-10-CM | POA: Diagnosis present

## 2014-07-20 DIAGNOSIS — Z806 Family history of leukemia: Secondary | ICD-10-CM | POA: Diagnosis not present

## 2014-07-20 DIAGNOSIS — I2510 Atherosclerotic heart disease of native coronary artery without angina pectoris: Secondary | ICD-10-CM | POA: Diagnosis present

## 2014-07-20 DIAGNOSIS — E785 Hyperlipidemia, unspecified: Secondary | ICD-10-CM | POA: Diagnosis present

## 2014-07-20 DIAGNOSIS — Z66 Do not resuscitate: Secondary | ICD-10-CM | POA: Diagnosis present

## 2014-07-20 HISTORY — DX: Pneumonia, unspecified organism: J18.9

## 2014-07-20 LAB — CBC WITH DIFFERENTIAL/PLATELET
BASOS PCT: 0 % (ref 0–1)
Basophils Absolute: 0 10*3/uL (ref 0.0–0.1)
EOS ABS: 0 10*3/uL (ref 0.0–0.7)
EOS PCT: 0 % (ref 0–5)
HCT: 37.1 % (ref 36.0–46.0)
HEMOGLOBIN: 12.4 g/dL (ref 12.0–15.0)
Lymphocytes Relative: 15 % (ref 12–46)
Lymphs Abs: 1.4 10*3/uL (ref 0.7–4.0)
MCH: 28.6 pg (ref 26.0–34.0)
MCHC: 33.4 g/dL (ref 30.0–36.0)
MCV: 85.5 fL (ref 78.0–100.0)
Monocytes Absolute: 1.4 10*3/uL — ABNORMAL HIGH (ref 0.1–1.0)
Monocytes Relative: 14 % — ABNORMAL HIGH (ref 3–12)
NEUTROS ABS: 6.7 10*3/uL (ref 1.7–7.7)
Neutrophils Relative %: 71 % (ref 43–77)
Platelets: 209 10*3/uL (ref 150–400)
RBC: 4.34 MIL/uL (ref 3.87–5.11)
RDW: 14 % (ref 11.5–15.5)
WBC: 9.5 10*3/uL (ref 4.0–10.5)

## 2014-07-20 LAB — URINALYSIS, ROUTINE W REFLEX MICROSCOPIC
BILIRUBIN URINE: NEGATIVE
Glucose, UA: 100 mg/dL — AB
Ketones, ur: NEGATIVE mg/dL
Leukocytes, UA: NEGATIVE
Nitrite: NEGATIVE
PH: 6 (ref 5.0–8.0)
PROTEIN: NEGATIVE mg/dL
SPECIFIC GRAVITY, URINE: 1.011 (ref 1.005–1.030)
UROBILINOGEN UA: 0.2 mg/dL (ref 0.0–1.0)

## 2014-07-20 LAB — COMPREHENSIVE METABOLIC PANEL
ALK PHOS: 91 U/L (ref 38–126)
ALT: 13 U/L — AB (ref 14–54)
ANION GAP: 10 (ref 5–15)
AST: 25 U/L (ref 15–41)
Albumin: 3.5 g/dL (ref 3.5–5.0)
BILIRUBIN TOTAL: 0.8 mg/dL (ref 0.3–1.2)
BUN: 15 mg/dL (ref 6–20)
CALCIUM: 8.5 mg/dL — AB (ref 8.9–10.3)
CHLORIDE: 95 mmol/L — AB (ref 101–111)
CO2: 22 mmol/L (ref 22–32)
Creatinine, Ser: 0.69 mg/dL (ref 0.44–1.00)
GFR calc non Af Amer: >60 mL/min (ref >60)
Glucose, Bld: 162 mg/dL — ABNORMAL HIGH (ref 65–99)
POTASSIUM: 4.7 mmol/L (ref 3.5–5.1)
SODIUM: 127 mmol/L — AB (ref 135–145)
TOTAL PROTEIN: 6.7 g/dL (ref 6.5–8.1)

## 2014-07-20 LAB — URINE MICROSCOPIC-ADD ON

## 2014-07-20 LAB — I-STAT CG4 LACTIC ACID, ED: Lactic Acid, Venous: 0.88 mmol/L (ref 0.5–2.0)

## 2014-07-20 MED ORDER — DEXTROSE 5 % IV SOLN
2.0000 g | Freq: Two times a day (BID) | INTRAVENOUS | Status: DC
  Administered 2014-07-20 – 2014-07-23 (×6): 2 g via INTRAVENOUS
  Filled 2014-07-20 (×7): qty 2

## 2014-07-20 MED ORDER — SODIUM CHLORIDE 0.9 % IV BOLUS (SEPSIS)
1000.0000 mL | INTRAVENOUS | Status: AC
  Administered 2014-07-20 (×2): 1000 mL via INTRAVENOUS

## 2014-07-20 MED ORDER — SODIUM CHLORIDE 0.9 % IV BOLUS (SEPSIS)
500.0000 mL | INTRAVENOUS | Status: AC
  Administered 2014-07-20: 500 mL via INTRAVENOUS

## 2014-07-20 MED ORDER — VANCOMYCIN HCL IN DEXTROSE 1-5 GM/200ML-% IV SOLN
1000.0000 mg | Freq: Once | INTRAVENOUS | Status: AC
  Administered 2014-07-20: 1000 mg via INTRAVENOUS
  Filled 2014-07-20: qty 200

## 2014-07-20 MED ORDER — ACETAMINOPHEN 325 MG PO TABS
650.0000 mg | ORAL_TABLET | Freq: Once | ORAL | Status: AC
  Administered 2014-07-20: 650 mg via ORAL
  Filled 2014-07-20: qty 2

## 2014-07-20 NOTE — ED Provider Notes (Signed)
CSN: [id number]     Arrival date & time 07/20/14  2140 History   First MD Initiated Contact with Patient 07/20/14 2149     Chief Complaint  Patient presents with  . Cough  . Fever  . Tachycardia     (Consider location/radiation/quality/duration/timing/severity/associated sxs/prior Treatment) HPI  81 year old female presents from home with fever and cough. The patient's history is taken from the patient, granddaughter, and EMS. She's been having a cough with some white sputum for the past 2-3 days. Was not known that the patient has had fever but she has been having a flushed face. She's been having issues with hard to control hypertension into the granddaughter has been tracking her blood pressures which have been consistently in the 976 systolic range. This evening the patient came back from being outside where was warm and when she was walking inside she was noticeably short of breath and flushed. After she sat down and they checked her blood pressure heart rate and she was hypertensive and tachycardic to the 120s. Due to this the daughter called EMS. The patient has been diagnosed with mild dementia but the patient is not confused and is at her normal mental baseline. She has occasionally been mumbling to herself over the last couple days but this is not too far out of the normal for her. No vomiting, complaints of chest pain, or urinary symptoms. With EMS that she had a temperature of 101 and was tachycardic and hypertensive.  Past Medical History  Diagnosis Date  . Coronary artery disease   . Hypertension   . Arthritis   . Diabetes mellitus   . Renal disorder   . Sleep apnea   . Thyroid disease   . Glaucoma   . Osteopenia   . Elevated cholesterol   . Back pain   . PUD (peptic ulcer disease)   . Macular degeneration   . Typhoid fever   . COPD (chronic obstructive pulmonary disease)   . Dementia   . Back pain    Past Surgical History  Procedure Laterality Date  .  Cholecystectomy    . Abdominal hysterectomy    . Cataract extraction    . Back surgery    . Thyroid surgery     Family History  Problem Relation Age of Onset  . Leukemia Mother   . Pneumonia Father   . Diabetes Brother    History  Substance Use Topics  . Smoking status: Never Smoker   . Smokeless tobacco: Not on file  . Alcohol Use: No   OB History    No data available     Review of Systems  Constitutional: Negative for fever.  Respiratory: Positive for cough and shortness of breath.   Cardiovascular: Negative for chest pain.  Gastrointestinal: Negative for vomiting.  Genitourinary: Negative for dysuria.  All other systems reviewed and are negative.     Allergies  Clonidine derivatives; Ezetimibe-simvastatin; Lipitor; Metformin and related; Morphine and related; and Codeine  Home Medications   Prior to Admission medications   Medication Sig Start Date End Date Taking? Authorizing Provider  acetaminophen (TYLENOL) 650 MG CR tablet Take 1,300 mg by mouth 2 (two) times daily.    Historical Provider, MD  amLODipine (NORVASC) 5 MG tablet Take 5 mg by mouth every morning.    Historical Provider, MD  brimonidine-timolol (COMBIGAN) 0.2-0.5 % ophthalmic solution Place 1 drop into both eyes every 12 (twelve) hours.    Historical Provider, MD  cholecalciferol (VITAMIN D) 1000 UNITS  tablet Take 2,000 Units by mouth every morning.     Historical Provider, MD  cloNIDine (CATAPRES) 0.1 MG tablet Take 1 tablet by mouth at bedtime. 04/08/14   Historical Provider, MD  docusate sodium (COLACE) 100 MG capsule Take 100 mg by mouth daily.    Historical Provider, MD  fish oil-omega-3 fatty acids 1000 MG capsule Take 1 g by mouth every morning.     Historical Provider, MD  gabapentin (NEURONTIN) 250 MG/5ML solution Take 500-1,000 mg by mouth 3 (three) times daily. Take 48ml= 500mg  in the morning and afternoon, and take 62ml or 1000mg  at bedtime    Historical Provider, MD   HYDROcodone-acetaminophen (NORCO) 10-325 MG per tablet Take 1 tablet by mouth every 8 (eight) hours as needed. 04/12/14   Historical Provider, MD  HYDROcodone-acetaminophen (NORCO/VICODIN) 5-325 MG per tablet Take 1-2 tablets by mouth every 4 (four) hours as needed. 03/16/13   Charlann Lange, PA-C  levothyroxine (SYNTHROID, LEVOTHROID) 100 MCG tablet Take 100 mcg by mouth daily with breakfast.     Historical Provider, MD  LORazepam (ATIVAN) 0.5 MG tablet Take 1 tablet by mouth at bedtime. 04/08/14   Historical Provider, MD  memantine (NAMENDA) 5 MG tablet Take 5 mg by mouth 2 (two) times daily.    Historical Provider, MD  metoprolol tartrate (LOPRESSOR) 25 MG tablet Take 50 mg by mouth 2 (two) times daily.  02/18/13   Historical Provider, MD  Multiple Vitamins-Minerals (PRESERVISION/LUTEIN PO) Take 1 tablet by mouth every morning.     Historical Provider, MD  omeprazole (PRILOSEC) 20 MG capsule Take 40 mg by mouth daily.    Historical Provider, MD  oxyCODONE-acetaminophen (PERCOCET) 10-325 MG per tablet Take 1 tablet by mouth every 6 (six) hours as needed for pain. 03/20/14   Noemi Chapel, MD  pravastatin (PRAVACHOL) 40 MG tablet Take 40 mg by mouth every evening.     Historical Provider, MD  valsartan (DIOVAN) 320 MG tablet Take 0.5 tablets (160 mg total) by mouth every morning. 04/26/14   Nita Sells, MD  vitamin B-12 (CYANOCOBALAMIN) 1000 MCG tablet Take 2,000 mcg by mouth every morning.     Historical Provider, MD   BP 147/90 mmHg  Pulse 118  Temp(Src) 99.2 F (37.3 C) (Oral)  Resp 22  Ht 5\' 5"  (1.651 m)  Wt 165 lb (74.844 kg)  BMI 27.46 kg/m2  SpO2 94% Physical Exam  Constitutional: She is oriented to person, place, and time. She appears well-developed and well-nourished.  HENT:  Head: Normocephalic and atraumatic.  Right Ear: External ear normal.  Left Ear: External ear normal.  Nose: Nose normal.  Eyes: Right eye exhibits no discharge. Left eye exhibits no discharge.   Cardiovascular: Regular rhythm and normal heart sounds.  Tachycardia present.   Pulmonary/Chest: Effort normal and breath sounds normal. She has no wheezes. She has no rales.  Abdominal: Soft. She exhibits no distension. There is no tenderness.  Neurological: She is alert and oriented to person, place, and time.  Skin: Skin is warm and dry. No erythema.  Nursing note and vitals reviewed.   ED Course  Procedures (including critical care time) Labs Review Labs Reviewed  COMPREHENSIVE METABOLIC PANEL - Abnormal; Notable for the following:    Sodium 127 (*)    Chloride 95 (*)    Glucose, Bld 162 (*)    Calcium 8.5 (*)    ALT 13 (*)    All other components within normal limits  CBC WITH DIFFERENTIAL/PLATELET - Abnormal; Notable for the  following:    Monocytes Relative 14 (*)    Monocytes Absolute 1.4 (*)    All other components within normal limits  URINALYSIS, ROUTINE W REFLEX MICROSCOPIC (NOT AT [facility]) - Abnormal; Notable for the following:    Glucose, UA 100 (*)    Hgb urine dipstick TRACE (*)    All other components within normal limits  CULTURE, BLOOD (ROUTINE X 2)  CULTURE, BLOOD (ROUTINE X 2)  URINE CULTURE  URINE MICROSCOPIC-ADD ON  I-STAT CG4 LACTIC ACID, ED    Imaging Review Dg Chest Port 1 View  07/20/2014   CLINICAL DATA:  Fever cough and tachycardia  EXAM: PORTABLE CHEST - 1 VIEW  COMPARISON:  04/24/2014  FINDINGS: There is patchy opacity in the medial right upper lobe which could represent an acute infiltrate. There is mild generalized vascular prominence. There is no large effusion. Heart size is normal and unchanged.  IMPRESSION: Possible early right upper lobe infiltrate. Mild vascular prominence.   Electronically Signed   By: Andreas Newport M.D.   On: 07/20/2014 22:44     EKG Interpretation None      MDM   Final diagnoses:  HCAP (healthcare-associated pneumonia)    Patient initially febrile and tachycardic although this has improved after Tylenol and  fluids. Lactate and white blood cell count are normal the patient does have signs of pneumonia on x-ray and was discharged from the hospital less than 3 months ago. By this she is hospital-acquired pneumonia. Given her multiple comorbidities I believe IV antibiotics and admission the hospital would be in her best interest. Discussed with Dr. Nehemiah Settle who will admit.    Sherwood Gambler, MD 07/20/14 [phone]

## 2014-07-20 NOTE — ED Notes (Signed)
Pt brought via EMS from home,  She had a fever, cough and high heart rate,  Pt ambulatory to stretcher from home and here from doorway to stretcher in room,  Pt does have a "bad cough"

## 2014-07-20 NOTE — ED Notes (Signed)
Bed: FG90 Expected date:  Expected time:  Means of arrival:  Comments: EMS 81yo F febrille, tachy, cough

## 2014-07-21 LAB — BASIC METABOLIC PANEL
Anion gap: 4 — ABNORMAL LOW (ref 5–15)
BUN: 12 mg/dL (ref 6–20)
CO2: 27 mmol/L (ref 22–32)
Calcium: 8.4 mg/dL — ABNORMAL LOW (ref 8.9–10.3)
Chloride: 99 mmol/L — ABNORMAL LOW (ref 101–111)
Creatinine, Ser: 0.76 mg/dL (ref 0.44–1.00)
GLUCOSE: 136 mg/dL — AB (ref 65–99)
Potassium: 4 mmol/L (ref 3.5–5.1)
SODIUM: 130 mmol/L — AB (ref 135–145)

## 2014-07-21 LAB — CBC
HCT: 35.3 % — ABNORMAL LOW (ref 36.0–46.0)
Hemoglobin: 11.6 g/dL — ABNORMAL LOW (ref 12.0–15.0)
MCH: 28.4 pg (ref 26.0–34.0)
MCHC: 32.9 g/dL (ref 30.0–36.0)
MCV: 86.5 fL (ref 78.0–100.0)
PLATELETS: 193 10*3/uL (ref 150–400)
RBC: 4.08 MIL/uL (ref 3.87–5.11)
RDW: 14.1 % (ref 11.5–15.5)
WBC: 6.4 10*3/uL (ref 4.0–10.5)

## 2014-07-21 LAB — GLUCOSE, CAPILLARY
GLUCOSE-CAPILLARY: 171 mg/dL — AB (ref 65–99)
GLUCOSE-CAPILLARY: 109 mg/dL — AB (ref 65–99)
Glucose-Capillary: 129 mg/dL — ABNORMAL HIGH (ref 65–99)
Glucose-Capillary: 72 mg/dL (ref 65–99)

## 2014-07-21 LAB — HIV ANTIBODY (ROUTINE TESTING W REFLEX): HIV Screen 4th Generation wRfx: NONREACTIVE

## 2014-07-21 LAB — URINE CULTURE
COLONY COUNT: NO GROWTH
Culture: NO GROWTH

## 2014-07-21 LAB — STREP PNEUMONIAE URINARY ANTIGEN: STREP PNEUMO URINARY ANTIGEN: NEGATIVE

## 2014-07-21 LAB — MRSA PCR SCREENING: MRSA by PCR: POSITIVE — AB

## 2014-07-21 MED ORDER — TIMOLOL MALEATE 0.5 % OP SOLN
1.0000 [drp] | Freq: Two times a day (BID) | OPHTHALMIC | Status: DC
  Administered 2014-07-21 – 2014-07-23 (×6): 1 [drp] via OPHTHALMIC
  Filled 2014-07-21: qty 5

## 2014-07-21 MED ORDER — GABAPENTIN 250 MG/5ML PO SOLN
1000.0000 mg | Freq: Every day | ORAL | Status: DC
  Administered 2014-07-21 – 2014-07-22 (×3): 1000 mg via ORAL
  Filled 2014-07-21 (×6): qty 20

## 2014-07-21 MED ORDER — BRIMONIDINE TARTRATE 0.2 % OP SOLN
1.0000 [drp] | Freq: Two times a day (BID) | OPHTHALMIC | Status: DC
  Administered 2014-07-21 – 2014-07-23 (×7): 1 [drp] via OPHTHALMIC
  Filled 2014-07-21: qty 5

## 2014-07-21 MED ORDER — PANTOPRAZOLE SODIUM 40 MG PO TBEC
80.0000 mg | DELAYED_RELEASE_TABLET | Freq: Every day | ORAL | Status: DC
  Administered 2014-07-21 – 2014-07-23 (×3): 80 mg via ORAL
  Filled 2014-07-21 (×3): qty 2

## 2014-07-21 MED ORDER — IRBESARTAN 300 MG PO TABS
300.0000 mg | ORAL_TABLET | Freq: Every day | ORAL | Status: DC
Start: 2014-07-21 — End: 2014-07-23
  Administered 2014-07-21 – 2014-07-23 (×3): 300 mg via ORAL
  Filled 2014-07-21 (×3): qty 1

## 2014-07-21 MED ORDER — HYDROCODONE-ACETAMINOPHEN 10-325 MG PO TABS: 1.0000 | ORAL_TABLET | Freq: Three times a day (TID) | ORAL | Status: DC | PRN

## 2014-07-21 MED ORDER — POLYVINYL ALCOHOL 1.4 % OP SOLN
1.0000 [drp] | Freq: Every day | OPHTHALMIC | Status: DC | PRN
  Filled 2014-07-21: qty 15

## 2014-07-21 MED ORDER — BRIMONIDINE TARTRATE-TIMOLOL 0.2-0.5 % OP SOLN
1.0000 [drp] | Freq: Two times a day (BID) | OPHTHALMIC | Status: DC
  Administered 2014-07-21: 1 [drp] via OPHTHALMIC

## 2014-07-21 MED ORDER — GLIPIZIDE 10 MG PO TABS
10.0000 mg | ORAL_TABLET | Freq: Every day | ORAL | Status: DC
  Administered 2014-07-21 – 2014-07-23 (×3): 10 mg via ORAL
  Filled 2014-07-21 (×5): qty 1

## 2014-07-21 MED ORDER — PRAVASTATIN SODIUM 40 MG PO TABS
40.0000 mg | ORAL_TABLET | Freq: Every evening | ORAL | Status: DC
  Administered 2014-07-21 – 2014-07-22 (×2): 40 mg via ORAL
  Filled 2014-07-21 (×3): qty 1

## 2014-07-21 MED ORDER — ENOXAPARIN SODIUM 40 MG/0.4ML ~~LOC~~ SOLN
40.0000 mg | SUBCUTANEOUS | Status: DC
  Administered 2014-07-21 – 2014-07-23 (×3): 40 mg via SUBCUTANEOUS
  Filled 2014-07-21 (×3): qty 0.4

## 2014-07-21 MED ORDER — SODIUM CHLORIDE 0.9 % IV SOLN
INTRAVENOUS | Status: DC
Start: 2014-07-21 — End: 2014-07-23
  Administered 2014-07-21 – 2014-07-22 (×3): via INTRAVENOUS

## 2014-07-21 MED ORDER — AMLODIPINE BESYLATE 5 MG PO TABS
5.0000 mg | ORAL_TABLET | Freq: Every morning | ORAL | Status: DC
  Administered 2014-07-21 – 2014-07-23 (×3): 5 mg via ORAL
  Filled 2014-07-21 (×3): qty 1

## 2014-07-21 MED ORDER — VANCOMYCIN HCL IN DEXTROSE 750-5 MG/150ML-% IV SOLN
750.0000 mg | Freq: Two times a day (BID) | INTRAVENOUS | Status: DC
  Administered 2014-07-21 – 2014-07-23 (×5): 750 mg via INTRAVENOUS
  Filled 2014-07-21 (×6): qty 150

## 2014-07-21 MED ORDER — MUPIROCIN 2 % EX OINT
1.0000 "application " | TOPICAL_OINTMENT | Freq: Two times a day (BID) | CUTANEOUS | Status: DC
  Administered 2014-07-21 – 2014-07-23 (×5): 1 via NASAL
  Filled 2014-07-21: qty 22

## 2014-07-21 MED ORDER — LEVOTHYROXINE SODIUM 100 MCG PO TABS
100.0000 ug | ORAL_TABLET | Freq: Every day | ORAL | Status: DC
  Administered 2014-07-21 – 2014-07-23 (×3): 100 ug via ORAL
  Filled 2014-07-21 (×5): qty 1

## 2014-07-21 MED ORDER — LORAZEPAM 0.5 MG PO TABS
0.5000 mg | ORAL_TABLET | Freq: Every day | ORAL | Status: DC
  Administered 2014-07-21 – 2014-07-22 (×3): 0.5 mg via ORAL
  Filled 2014-07-21 (×3): qty 1

## 2014-07-21 MED ORDER — METOPROLOL TARTRATE 50 MG PO TABS
50.0000 mg | ORAL_TABLET | Freq: Two times a day (BID) | ORAL | Status: DC
  Administered 2014-07-21 – 2014-07-23 (×6): 50 mg via ORAL
  Filled 2014-07-21 (×7): qty 1

## 2014-07-21 MED ORDER — MEMANTINE HCL 5 MG PO TABS
5.0000 mg | ORAL_TABLET | Freq: Two times a day (BID) | ORAL | Status: DC
Start: 2014-07-21 — End: 2014-07-23
  Administered 2014-07-21 – 2014-07-23 (×6): 5 mg via ORAL
  Filled 2014-07-21 (×7): qty 1

## 2014-07-21 MED ORDER — CHLORHEXIDINE GLUCONATE CLOTH 2 % EX PADS
6.0000 | MEDICATED_PAD | Freq: Every day | CUTANEOUS | Status: DC
  Administered 2014-07-21 – 2014-07-23 (×3): 6 via TOPICAL

## 2014-07-21 MED ORDER — DOCUSATE SODIUM 100 MG PO CAPS
100.0000 mg | ORAL_CAPSULE | Freq: Every day | ORAL | Status: DC
  Administered 2014-07-21 – 2014-07-23 (×3): 100 mg via ORAL
  Filled 2014-07-21 (×3): qty 1

## 2014-07-21 MED ORDER — GABAPENTIN 250 MG/5ML PO SOLN
500.0000 mg | ORAL | Status: DC
  Administered 2014-07-21 – 2014-07-23 (×5): 500 mg via ORAL
  Filled 2014-07-21 (×12): qty 10

## 2014-07-21 MED ORDER — CLONIDINE HCL 0.1 MG PO TABS
0.1000 mg | ORAL_TABLET | Freq: Every day | ORAL | Status: DC
  Administered 2014-07-21 – 2014-07-22 (×3): 0.1 mg via ORAL
  Filled 2014-07-21 (×4): qty 1

## 2014-07-21 NOTE — Progress Notes (Signed)
Patient Demographics  Lori Long, is a 81 y.o. female, DOB - 18-Dec-1923, IOX:[id number]  Admit date - 07/20/2014   Admitting Physician Truett Mainland, DO  Outpatient Primary MD for the patient is Marjorie Smolder, MD  LOS - 1   Chief Complaint  Patient presents with  . Cough  . Fever  . Tachycardia       Admission HPI/Brief narrative: 81 year old female with known history of diabetes, hypertension, COPD, mild dementia, coronary artery disease, hypothyroidism, presents with complaints of cough, productive sputum and fever, chest x-ray significant for right upper lobe infiltrate, been treated for  HCAP( hospital stay within 90 days)  Subjective:   Lori Long today has, No headache, No chest pain, No abdominal pain - No Nausea, No new weakness tingling or numbness, complaints of cough and productive sputum  Assessment & Plan    Active Problems:   Hyponatremia   HCAP (healthcare-associated pneumonia)  Healthcare acquired pneumonia : - Recent hospitalization, chest x-ray showing right upper infiltrate, continue with IV vancomycin and Fortaz . - Blood cultures no growth to date  - Follow on sputum culture, Legionella antigen, strep B nonreactive   Hyponatremia  - improving, Continue to volume depletion, continue with IV normal saline   Hypertension - Blood pressure on the higher side, continue to monitor on home medication.  Hypothyroidism - Continue with Synthroid  Diabetes mellitus - Continue glipizide, will monitor CBGs, if needed will add insulin sliding scale.  Hyperlipidemia - Continue with statin  Code Status: DO NOT RESUSCITATE  Family Communication: None at bedside  Disposition Plan: Pending further workup   Procedures None   Consults   None   Medications  Scheduled Meds: . amLODipine  5 mg Oral q morning - 10a  . brimonidine  1 drop Both Eyes BID  .  cefTAZidime (FORTAZ)  IV  2 g Intravenous Q12H  . Chlorhexidine Gluconate Cloth  6 each Topical Q0600  . cloNIDine  0.1 mg Oral QHS  . docusate sodium  100 mg Oral Daily  . enoxaparin (LOVENOX) injection  40 mg Subcutaneous Q24H  . gabapentin  1,000 mg Oral QHS  . gabapentin  500 mg Oral 2 times per day  . glipiZIDE  10 mg Oral QAC breakfast  . irbesartan  300 mg Oral Daily  . levothyroxine  100 mcg Oral QAC breakfast  . LORazepam  0.5 mg Oral QHS  . memantine  5 mg Oral BID  . metoprolol tartrate  50 mg Oral BID  . mupirocin ointment  1 application Nasal BID  . pantoprazole  80 mg Oral Daily  . pravastatin  40 mg Oral QPM  . timolol  1 drop Both Eyes BID  . vancomycin  750 mg Intravenous Q12H   Continuous Infusions: . sodium chloride 100 mL/hr at 07/21/14 0119   PRN Meds:.HYDROcodone-acetaminophen, polyvinyl alcohol  DVT Prophylaxis  Lovenox -   Lab Results  Component Value Date   PLT 193 07/21/2014    Antibiotics    Anti-infectives    Start     Dose/Rate Route Frequency Ordered Stop   07/21/14 1000  vancomycin (VANCOCIN) IVPB 750 mg/150 ml premix     750 mg 150 mL/hr over 60 Minutes Intravenous Every 12 hours 07/21/14  8832     07/20/14 2245  vancomycin (VANCOCIN) IVPB 1000 mg/200 mL premix     1,000 mg 200 mL/hr over 60 Minutes Intravenous  Once 07/20/14 2240 07/21/14 0037   07/20/14 2245  cefTAZidime (FORTAZ) 2 g in dextrose 5 % 50 mL IVPB     2 g 100 mL/hr over 30 Minutes Intravenous Every 12 hours 07/20/14 2240            Objective:   Filed Vitals:   07/20/14 2330 07/20/14 2345 07/21/14 0032 07/21/14 0555  BP: 166/65  182/87 169/60  Pulse: 109  104 63  Temp:  97.9 F (36.6 C) 98.2 F (36.8 C) 97.4 F (36.3 C)  TempSrc:  Oral Oral Oral  Resp: 17  18 18   Height:   5' 5"  (1.651 m)   Weight:   70.9 kg (156 lb 4.9 oz)   SpO2: 97%  95% 97%    Wt Readings from Last 3 Encounters:  07/21/14 70.9 kg (156 lb 4.9 oz)  04/24/14 69.7 kg (153 lb 10.6 oz)      Intake/Output Summary (Last 24 hours) at 07/21/14 1057 Last data filed at 07/21/14 0900  Gross per 24 hour  Intake 768.33 ml  Output   1325 ml  Net -556.67 ml     Physical Exam  Awake Alert, Oriented , No new F.N deficits,  Day.AT,PERRAL Supple Neck,No JVD, No cervical lymphadenopathy appriciated.  Symmetrical Chest wall movement, Good air movement bilaterally,  RRR,No Gallops,Rubs or new Murmurs, No Parasternal Heave +ve B.Sounds, Abd Soft, No tenderness, No organomegaly appriciated, No rebound - guarding or rigidity. No Cyanosis, Clubbing or edema, No new Rash or bruise     Data Review   Micro Results Recent Results (from the past 240 hour(s))  MRSA PCR Screening     Status: Abnormal   Collection Time: 07/21/14  1:00 AM  Result Value Ref Range Status   MRSA by PCR POSITIVE (A) NEGATIVE Final    Comment:        The GeneXpert MRSA Assay (FDA approved for NASAL specimens only), is one component of a comprehensive MRSA colonization surveillance program. It is not intended to diagnose MRSA infection nor to guide or monitor treatment for MRSA infections. RESULT CALLED TO, READ BACK BY AND VERIFIED WITH: B MAY RN AT [phone] ON 06.12.16 BY G DeCordova     Radiology Reports Dg Ankle Complete Left  06/26/2014   CLINICAL DATA:  [place] 2 days ago with pain and bruising  EXAM: LEFT ANKLE COMPLETE - 3+ VIEW  COMPARISON:  None.  FINDINGS: The ankle joint appears normal. Alignment is normal. No ankle fracture is seen. However, there is a nondisplaced fracture through the base of the left fifth metatarsal.  IMPRESSION: 1. Negative left ankle. 2. Nondisplaced fracture through the base of the left fifth metatarsal.   Electronically Signed   By: Ivar Drape M.D.   On: 06/26/2014 16:56   Dg Chest Port 1 View  07/20/2014   CLINICAL DATA:  Fever cough and tachycardia  EXAM: PORTABLE CHEST - 1 VIEW  COMPARISON:  04/24/2014  FINDINGS: There is patchy opacity in the medial right upper lobe  which could represent an acute infiltrate. There is mild generalized vascular prominence. There is no large effusion. Heart size is normal and unchanged.  IMPRESSION: Possible early right upper lobe infiltrate. Mild vascular prominence.   Electronically Signed   By: Andreas Newport M.D.   On: 07/20/2014 22:44   Dg Foot Complete  Left  06/26/2014   CLINICAL DATA:  81 year old female with a history of fall 2 days prior. Pain.  EXAM: LEFT FOOT - COMPLETE 3+ VIEW  COMPARISON:  None.  FINDINGS: Acute fracture the base of the left fifth metatarsal. Associated soft tissue swelling. No other acute bony met abnormality identified.  Osteopenia.  Osteoarthritis of the interphalangeal joints and of the hindfoot.  IMPRESSION: Acute fracture the base of the left fifth metatarsal with associated soft tissue swelling.  Signed,  Dulcy Fanny. Earleen Newport, DO  [facility]  [facility]   Electronically Signed   By: Corrie Mckusick D.O.   On: 06/26/2014 16:55     CBC  Recent Labs Lab 07/20/14 2208 07/21/14 0530  WBC 9.5 6.4  HGB 12.4 11.6*  HCT 37.1 35.3*  PLT 209 193  MCV 85.5 86.5  MCH 28.6 28.4  MCHC 33.4 32.9  RDW 14.0 14.1  LYMPHSABS 1.4  --   MONOABS 1.4*  --   EOSABS 0.0  --   BASOSABS 0.0  --     Chemistries   Recent Labs Lab 07/20/14 2208 07/21/14 0530  NA 127* 130*  K 4.7 4.0  CL 95* 99*  CO2 22 27  GLUCOSE 162* 136*  BUN 15 12  CREATININE 0.69 0.76  CALCIUM 8.5* 8.4*  AST 25  --   ALT 13*  --   ALKPHOS 91  --   BILITOT 0.8  --    ------------------------------------------------------------------------------------------------------------------ estimated creatinine clearance is 47.1 mL/min (by C-G formula based on Cr of 0.76). ------------------------------------------------------------------------------------------------------------------ No results for input(s): HGBA1C in the last 72  hours. ------------------------------------------------------------------------------------------------------------------ No results for input(s): CHOL, HDL, LDLCALC, TRIG, CHOLHDL, LDLDIRECT in the last 72 hours. ------------------------------------------------------------------------------------------------------------------ No results for input(s): TSH, T4TOTAL, T3FREE, THYROIDAB in the last 72 hours.  Invalid input(s): FREET3 ------------------------------------------------------------------------------------------------------------------ No results for input(s): VITAMINB12, FOLATE, FERRITIN, TIBC, IRON, RETICCTPCT in the last 72 hours.  Coagulation profile No results for input(s): INR, PROTIME in the last 168 hours.  No results for input(s): DDIMER in the last 72 hours.  Cardiac Enzymes No results for input(s): CKMB, TROPONINI, MYOGLOBIN in the last 168 hours.  Invalid input(s): CK ------------------------------------------------------------------------------------------------------------------ Invalid input(s): POCBNP     Time Spent in minutes   30 minutes   Jonathen Rathman M.D on 07/21/2014 at 10:57 AM  Between 7am to 7pm - Pager - [phone]  After 7pm go to [email or web address] - password [facility]  [facility]   Office  [phone]

## 2014-07-21 NOTE — H&P (Addendum)
History and Physical  Lori Long ZOX:[id number] DOB: 1923-07-26 DOA: 07/20/2014  Referring physician: Dr Regenia Skeeter, ED physician PCP: Marjorie Smolder, MD   Chief Complaint: Cough and fever  HPI: Lori Long is a 81 y.o. female  With a history of diabetes, hypertension, COPD, mild dementia, coronary artery disease, hypothyroidism. She presents to the hospital after 3 days of worsening cough with white sputum production and a fever that started today. She was brought by EMS due to elevated blood pressure, hypertension, and tachycardia. She has also had shortness of breath with ambulation that was improved with rest. No other palliating or provoking factors.  She was hospitalized within the past 90 days for greater than 2 days.   Review of Systems:   Pt denies any chills, nausea, vomiting, chest pain, palpitations, diarrhea, constipation, abdominal pain, headache, blurred vision, bruises, rash.  Review of systems are otherwise negative  Past Medical History  Diagnosis Date  . Coronary artery disease   . Hypertension   . Arthritis   . Diabetes mellitus   . Renal disorder   . Sleep apnea   . Thyroid disease   . Glaucoma   . Osteopenia   . Elevated cholesterol   . Back pain   . PUD (peptic ulcer disease)   . Macular degeneration   . Typhoid fever   . COPD (chronic obstructive pulmonary disease)   . Dementia   . Back pain   . HCAP (healthcare-associated pneumonia) 07/20/2014   Past Surgical History  Procedure Laterality Date  . Cholecystectomy    . Abdominal hysterectomy    . Cataract extraction    . Back surgery    . Thyroid surgery     Social History:  reports that she has never smoked. She does not have any smokeless tobacco history on file. She reports that she does not drink alcohol or use illicit drugs. Patient lives at home & is able to participate in activities of daily living with assistance  Allergies  Allergen Reactions  . Clonidine Derivatives     Local reaction to patch.  . Ezetimibe-Simvastatin Other (See Comments)    Leg pain from Vytorin  . Lipitor [Atorvastatin Calcium] Other (See Comments)    Couldn't tolerate  . Metformin And Related Diarrhea  . Morphine And Related Other (See Comments)    hallucination  . Codeine Rash    Family History  Problem Relation Age of Onset  . Leukemia Mother   . Pneumonia Father   . Diabetes Brother       Prior to Admission medications   Medication Sig Start Date End Date Taking? Authorizing Provider  acetaminophen (TYLENOL) 650 MG CR tablet Take 1,300 mg by mouth 2 (two) times daily.   Yes Historical Provider, MD  amLODipine (NORVASC) 5 MG tablet Take 5 mg by mouth every morning.   Yes Historical Provider, MD  cholecalciferol (VITAMIN D) 1000 UNITS tablet Take 2,000 Units by mouth every morning.    Yes Historical Provider, MD  cloNIDine (CATAPRES) 0.1 MG tablet Take 1 tablet by mouth at bedtime. 04/08/14  Yes Historical Provider, MD  docusate sodium (COLACE) 100 MG capsule Take 100 mg by mouth daily.   Yes Historical Provider, MD  fish oil-omega-3 fatty acids 1000 MG capsule Take 1 g by mouth every morning.    Yes Historical Provider, MD  gabapentin (NEURONTIN) 250 MG/5ML solution Take 500-1,000 mg by mouth 3 (three) times daily. Take 48ml= 500mg  in the morning and afternoon, and take 33ml  or 1000mg  at bedtime   Yes Historical Provider, MD  glipiZIDE (GLUCOTROL) 10 MG tablet Take 10 mg by mouth daily before breakfast.   Yes Historical Provider, MD  HYDROcodone-acetaminophen (NORCO) 10-325 MG per tablet Take 1 tablet by mouth every 8 (eight) hours as needed. 04/12/14  Yes Historical Provider, MD  levothyroxine (SYNTHROID, LEVOTHROID) 100 MCG tablet Take 100 mcg by mouth daily with breakfast.    Yes Historical Provider, MD  LORazepam (ATIVAN) 0.5 MG tablet Take 1 tablet by mouth at bedtime. 04/08/14  Yes Historical Provider, MD  memantine (NAMENDA) 5 MG tablet Take 5 mg by mouth 2 (two) times  daily.   Yes Historical Provider, MD  metoprolol tartrate (LOPRESSOR) 25 MG tablet Take 50 mg by mouth 2 (two) times daily.  02/18/13  Yes Historical Provider, MD  Multiple Vitamins-Minerals (PRESERVISION/LUTEIN PO) Take 1 tablet by mouth every morning.    Yes Historical Provider, MD  omeprazole (PRILOSEC) 20 MG capsule Take 40 mg by mouth daily.   Yes Historical Provider, MD  pravastatin (PRAVACHOL) 40 MG tablet Take 40 mg by mouth every evening.    Yes Historical Provider, MD  Propylene Glycol (SYSTANE BALANCE) 0.6 % SOLN Place 1 drop into both eyes daily as needed (dry eyes).   Yes Historical Provider, MD  valsartan (DIOVAN) 320 MG tablet Take 0.5 tablets (160 mg total) by mouth every morning. 04/26/14  Yes Nita Sells, MD  vitamin B-12 (CYANOCOBALAMIN) 1000 MCG tablet Take 2,000 mcg by mouth every morning.    Yes Historical Provider, MD  brimonidine-timolol (COMBIGAN) 0.2-0.5 % ophthalmic solution Place 1 drop into both eyes every 12 (twelve) hours.    Historical Provider, MD  HYDROcodone-acetaminophen (NORCO/VICODIN) 5-325 MG per tablet Take 1-2 tablets by mouth every 4 (four) hours as needed. Patient not taking: Reported on 07/20/2014 03/16/13   Charlann Lange, PA-C  oxyCODONE-acetaminophen (PERCOCET) 10-325 MG per tablet Take 1 tablet by mouth every 6 (six) hours as needed for pain. Patient not taking: Reported on 07/20/2014 03/20/14   Noemi Chapel, MD    Physical Exam: BP 166/65 mmHg  Pulse 109  Temp(Src) 97.9 F (36.6 C) (Oral)  Resp 17  Ht 5\' 5"  (1.651 m)  Wt 74.844 kg (165 lb)  BMI 27.46 kg/m2  SpO2 97%  General: Elderly Caucasian female. Awake and alert and oriented x3. No acute cardiopulmonary distress.  Eyes: Pupils equal, round, reactive to light. Extraocular muscles are intact. Sclerae anicteric and noninjected.  ENT:  Moist mucosal membranes. No mucosal lesions.   Neck: Neck supple without lymphadenopathy. No carotid bruits. No masses palpated.  Cardiovascular: Regular  rate with normal S1-S2 sounds. No murmurs, rubs, gallops auscultated. No JVD.  Respiratory: Mildly diminished breath sounds. Faint rales in the right upper lobe. No wheezes or rhonchi  Abdomen: Soft, nontender, nondistended. Active bowel sounds. No masses or hepatosplenomegaly  Skin: Dry, warm to touch. 2+ dorsalis pedis and radial pulses. Musculoskeletal: No calf or leg pain. All major joints not erythematous nontender.  Psychiatric: Intact judgment and insight.  Neurologic: No focal neurological deficits. Cranial nerves II through XII are grossly intact.           Labs on Admission:  Basic Metabolic Panel:  Recent Labs Lab 07/20/14 2208  NA 127*  K 4.7  CL 95*  CO2 22  GLUCOSE 162*  BUN 15  CREATININE 0.69  CALCIUM 8.5*   Liver Function Tests:  Recent Labs Lab 07/20/14 2208  AST 25  ALT 13*  ALKPHOS 91  BILITOT 0.8  PROT 6.7  ALBUMIN 3.5   No results for input(s): LIPASE, AMYLASE in the last 168 hours. No results for input(s): AMMONIA in the last 168 hours. CBC:  Recent Labs Lab 07/20/14 2208  WBC 9.5  NEUTROABS 6.7  HGB 12.4  HCT 37.1  MCV 85.5  PLT 209   Cardiac Enzymes: No results for input(s): CKTOTAL, CKMB, CKMBINDEX, TROPONINI in the last 168 hours.  BNP (last 3 results) No results for input(s): BNP in the last 8760 hours.  ProBNP (last 3 results) No results for input(s): PROBNP in the last 8760 hours.  CBG: No results for input(s): GLUCAP in the last 168 hours.  Radiological Exams on Admission: Dg Chest Port 1 View  07/20/2014   CLINICAL DATA:  Fever cough and tachycardia  EXAM: PORTABLE CHEST - 1 VIEW  COMPARISON:  04/24/2014  FINDINGS: There is patchy opacity in the medial right upper lobe which could represent an acute infiltrate. There is mild generalized vascular prominence. There is no large effusion. Heart size is normal and unchanged.  IMPRESSION: Possible early right upper lobe infiltrate. Mild vascular prominence.   Electronically  Signed   By: Andreas Newport M.D.   On: 07/20/2014 22:44    Assessment/Plan Present on Admission:  . HCAP (healthcare-associated pneumonia) . Hyponatremia  This patient was discussed with the ED physician, including pertinent vitals, physical exam findings, labs, and imaging.  We also discussed care given by the ED provider.  #1 healthcare associated pneumonia #2 hyponatremia #3 diabetes  Admit to MedSurg Continue vancomycin and Fortaz MRSA swab - if negative can discontinue vancomycin IV fluids at 100 mLs per hour for hyponatremia - her elevated heart rate supports that she may be hypovolemic CBGs before meals and at bedtime Continue home medications Check CBC in the morning. Check metabolic panel in the morning  DVT prophylaxis: Lovenox  Consultants: None  Code Status: DO NOT RESUSCITATE/DO NOT INTUBATE  Family Communication: Daughter in the room   Disposition Plan: Home following improvement   Truett Mainland, DO [facility] Pager [phone]

## 2014-07-21 NOTE — Progress Notes (Signed)
CRITICAL VALUE ALERT  Critical value received:  MRSA nares positive   Date of notification:  07/21/14   Time of notification:  0420   Critical value read back yes    Nurse who received alert:  Chancy Hurter RN   MD notified (1st page):  Fredirick Maudlin NP   Time of first page:    MD notified (2nd page):  Time of second page:  Responding MD:    Time MD responded:  Initiate  protocol

## 2014-07-21 NOTE — Progress Notes (Signed)
ANTIBIOTIC CONSULT NOTE - INITIAL  Pharmacy Consult for vancomycin, ceftazidime Indication: HCAP  Allergies  Allergen Reactions  . Clonidine Derivatives     Local reaction to patch.  . Ezetimibe-Simvastatin Other (See Comments)    Leg pain from Vytorin  . Lipitor [Atorvastatin Calcium] Other (See Comments)    Couldn't tolerate  . Metformin And Related Diarrhea  . Morphine And Related Other (See Comments)    hallucination  . Codeine Rash    Patient Measurements: Height: 5\' 5"  (165.1 cm) Weight: 156 lb 4.9 oz (70.9 kg) IBW/kg (Calculated) : 57 Adjusted Body Weight:   Vital Signs: Temp: 98.2 F (36.8 C) (06/12 0032) Temp Source: Oral (06/12 0032) BP: 182/87 mmHg (06/12 0032) Pulse Rate: 104 (06/12 0032) Intake/Output from previous day: 06/11 0701 - 06/12 0700 In: 268.3 [I.V.:268.3] Out: 200 [Urine:200] Intake/Output from this shift: Total I/O In: 268.3 [I.V.:268.3] Out: 200 [Urine:200]  Labs:  Recent Labs  07/20/14 2208  WBC 9.5  HGB 12.4  PLT 209  CREATININE 0.69   Estimated Creatinine Clearance: 47.1 mL/min (by C-G formula based on Cr of 0.69). No results for input(s): VANCOTROUGH, VANCOPEAK, VANCORANDOM, GENTTROUGH, GENTPEAK, GENTRANDOM, TOBRATROUGH, TOBRAPEAK, TOBRARND, AMIKACINPEAK, AMIKACINTROU, AMIKACIN in the last 72 hours.   Microbiology: Recent Results (from the past 720 hour(s))  MRSA PCR Screening     Status: Abnormal   Collection Time: 07/21/14  1:00 AM  Result Value Ref Range Status   MRSA by PCR POSITIVE (A) NEGATIVE Final    Comment:        The GeneXpert MRSA Assay (FDA approved for NASAL specimens only), is one component of a comprehensive MRSA colonization surveillance program. It is not intended to diagnose MRSA infection nor to guide or monitor treatment for MRSA infections. RESULT CALLED TO, READ BACK BY AND VERIFIED WITH: B MAY RN AT 0419 ON 06.12.16 BY G KONTOS     Medical History: Past Medical History  Diagnosis Date  .  Coronary artery disease   . Hypertension   . Arthritis   . Diabetes mellitus   . Renal disorder   . Sleep apnea   . Thyroid disease   . Glaucoma   . Osteopenia   . Elevated cholesterol   . Back pain   . PUD (peptic ulcer disease)   . Macular degeneration   . Typhoid fever   . COPD (chronic obstructive pulmonary disease)   . Dementia   . Back pain   . HCAP (healthcare-associated pneumonia) 07/20/2014    Medications:  Anti-infectives    Start     Dose/Rate Route Frequency Ordered Stop   07/21/14 1000  vancomycin (VANCOCIN) IVPB 750 mg/150 ml premix     750 mg 150 mL/hr over 60 Minutes Intravenous Every 12 hours 07/21/14 0447     07/20/14 2245  vancomycin (VANCOCIN) IVPB 1000 mg/200 mL premix     1,000 mg 200 mL/hr over 60 Minutes Intravenous  Once 07/20/14 2240 07/21/14 0037   07/20/14 2245  cefTAZidime (FORTAZ) 2 g in dextrose 5 % 50 mL IVPB     2 g 100 mL/hr over 30 Minutes Intravenous Every 12 hours 07/20/14 2240       Assessment: Patient with HCAP.  First dose of antibiotics already given   Goal of Therapy:  Vancomycin trough level 15-20 mcg/ml  Ceftazidime dosed based on patient weight and renal function   Plan:  Measure antibiotic drug levels at steady state Follow up culture results Continue with Ceftazidime 2gm iv q12hr, vancomycin 750mg   iv q12hr  Nani Skillern Crowford 07/21/2014,4:54 AM

## 2014-07-22 DIAGNOSIS — E871 Hypo-osmolality and hyponatremia: Secondary | ICD-10-CM

## 2014-07-22 DIAGNOSIS — J189 Pneumonia, unspecified organism: Principal | ICD-10-CM

## 2014-07-22 LAB — GLUCOSE, CAPILLARY
GLUCOSE-CAPILLARY: 127 mg/dL — AB (ref 65–99)
GLUCOSE-CAPILLARY: 100 mg/dL — AB (ref 65–99)
Glucose-Capillary: 134 mg/dL — ABNORMAL HIGH (ref 65–99)
Glucose-Capillary: 84 mg/dL (ref 65–99)

## 2014-07-22 LAB — BASIC METABOLIC PANEL
Anion gap: 7 (ref 5–15)
BUN: 13 mg/dL (ref 6–20)
CALCIUM: 8.2 mg/dL — AB (ref 8.9–10.3)
CHLORIDE: 98 mmol/L — AB (ref 101–111)
CO2: 26 mmol/L (ref 22–32)
Creatinine, Ser: 0.9 mg/dL (ref 0.44–1.00)
GFR calc Af Amer: >60 mL/min (ref >60)
GFR calc non Af Amer: 55 mL/min — ABNORMAL LOW (ref >60)
Glucose, Bld: 137 mg/dL — ABNORMAL HIGH (ref 65–99)
Potassium: 4.3 mmol/L (ref 3.5–5.1)
Sodium: 131 mmol/L — ABNORMAL LOW (ref 135–145)

## 2014-07-22 LAB — LEGIONELLA ANTIGEN, URINE

## 2014-07-22 NOTE — Care Management Note (Signed)
Case Management Note  Patient Details  Name: [name] MRN: [id number] Date of Birth: September 21, 1923  Subjective/Objective:          81 yo female admitted with HCAP          Action/Plan: Spoke with patient and sister at bedside. Patient lives at home with 24/7 caregivers (family members) in the home. Has cane, walker, BSC, shoer chair, and state no further DME needs. They have had services with [facility] in the past and would like to use their services again if [facility] ordered. Await PT recommendation.  Expected Discharge Date:  07/23/14               Expected Discharge Plan:  [place]  In-House Referral:     Discharge planning Services  CM Consult  Post Acute Care Choice:    Choice offered to:     DME Arranged:    DME Agency:     HH Arranged:    HH Agency:     Status of Service:  In process, will continue to follow  Medicare Important Message Given:    Date Medicare IM Given:    Medicare IM give by:    Date Additional Medicare IM Given:    Additional Medicare Important Message give by:     If discussed at [place] of Stay Meetings, dates discussed:    Additional Comments:  Scot Dock, RN 07/22/2014, 9:58 AM

## 2014-07-22 NOTE — Progress Notes (Signed)
Patient Demographics  Lori Long, is a 81 y.o. female, DOB - [birth year], MMN:[id number]  Admit date - 07/20/2014   Admitting Physician Truett Mainland, DO  Outpatient Primary MD for the patient is Marjorie Smolder, MD  LOS - 2   Chief Complaint  Patient presents with  . Cough  . Fever  . Tachycardia       Admission HPI/Brief narrative: 80 year old female with known history of diabetes, hypertension, COPD, mild dementia, coronary artery disease, hypothyroidism, presents with complaints of cough, productive sputum and fever, chest x-ray significant for right upper lobe infiltrate, been treated for  HCAP( hospital stay within 90 days)  Subjective:   Lori Long today has, No headache, No chest pain, No abdominal pain - No Nausea, No new weakness tingling or numbness, complaints of cough and productive sputum is improving Assessment & Plan    Active Problems:   Hyponatremia   HCAP (healthcare-associated pneumonia)  Healthcare acquired pneumonia : - Recent hospitalization, chest x-ray showing right upper infiltrate, continue with IV vancomycin and Fortaz . - Blood cultures no growth to date  - Follow on sputum culture, Legionella antigen, strep B nonreactive   Hyponatremia  - improving, Continue to volume depletion, continue with IV normal saline   Hypertension - Blood pressure more acceptable today, continue with home medication.  Hypothyroidism - Continue with Synthroid  Diabetes mellitus - Continue glipizide, will monitor CBGs, if needed will add insulin sliding scale.  Hyperlipidemia - Continue with statin  Code Status: DO NOT RESUSCITATE  Family Communication: None at bedside  Disposition Plan: Pending further workup   Procedures None   Consults   None   Medications  Scheduled Meds: . amLODipine  5 mg Oral q morning - 10a  . brimonidine  1 drop Both Eyes  BID  . cefTAZidime (FORTAZ)  IV  2 g Intravenous Q12H  . Chlorhexidine Gluconate Cloth  6 each Topical Q0600  . cloNIDine  0.1 mg Oral QHS  . docusate sodium  100 mg Oral Daily  . enoxaparin (LOVENOX) injection  40 mg Subcutaneous Q24H  . gabapentin  1,000 mg Oral QHS  . gabapentin  500 mg Oral 2 times per day  . glipiZIDE  10 mg Oral QAC breakfast  . irbesartan  300 mg Oral Daily  . levothyroxine  100 mcg Oral QAC breakfast  . LORazepam  0.5 mg Oral QHS  . memantine  5 mg Oral BID  . metoprolol tartrate  50 mg Oral BID  . mupirocin ointment  1 application Nasal BID  . pantoprazole  80 mg Oral Daily  . pravastatin  40 mg Oral QPM  . timolol  1 drop Both Eyes BID  . vancomycin  750 mg Intravenous Q12H   Continuous Infusions: . sodium chloride 50 mL/hr at 07/21/14 1727   PRN Meds:.HYDROcodone-acetaminophen, polyvinyl alcohol  DVT Prophylaxis  Lovenox -   Lab Results  Component Value Date   PLT 193 07/21/2014    Antibiotics    Anti-infectives    Start     Dose/Rate Route Frequency Ordered Stop   07/21/14 1000  vancomycin (VANCOCIN) IVPB 750 mg/150 ml premix     750 mg 150 mL/hr over 60 Minutes Intravenous Every 12 hours 07/21/14 0447  07/20/14 2245  vancomycin (VANCOCIN) IVPB 1000 mg/200 mL premix     1,000 mg 200 mL/hr over 60 Minutes Intravenous  Once 07/20/14 2240 07/21/14 0037   07/20/14 2245  cefTAZidime (FORTAZ) 2 g in dextrose 5 % 50 mL IVPB     2 g 100 mL/hr over 30 Minutes Intravenous Every 12 hours 07/20/14 2240            Objective:   Filed Vitals:   07/21/14 1500 07/21/14 2300 07/22/14 0601 07/22/14 1030  BP: 159/61 113/46 145/47   Pulse: 66 70 62 76  Temp: 97.6 F (36.4 C) 98.4 F (36.9 C) 97.8 F (36.6 C)   TempSrc: Oral Oral Oral   Resp: 18 18 18    Height:      Weight:      SpO2: 99% 97% 98%     Wt Readings from Last 3 Encounters:  07/21/14 70.9 kg (156 lb 4.9 oz)  04/24/14 69.7 kg (153 lb 10.6 oz)     Intake/Output Summary  (Last 24 hours) at 07/22/14 1405 Last data filed at 07/22/14 1353  Gross per 24 hour  Intake   1930 ml  Output   1200 ml  Net    730 ml     Physical Exam  Awake Alert, Oriented , No new F.N deficits,  [place].AT,PERRAL Supple Neck,No JVD, No cervical lymphadenopathy appriciated.  Symmetrical Chest wall movement, Good air movement bilaterally,  RRR,No Gallops,Rubs or new Murmurs, No Parasternal Heave +ve B.Sounds, Abd Soft, No tenderness, No organomegaly appriciated, No rebound - guarding or rigidity. No Cyanosis, Clubbing or edema, No new Rash or bruise     Data Review   Micro Results Recent Results (from the past 240 hour(s))  Blood Culture (routine x 2)     Status: None (Preliminary result)   Collection Time: 07/20/14 10:08 PM  Result Value Ref Range Status   Specimen Description BLOOD LEFT WRIST  Final   Special Requests BOTTLES DRAWN AEROBIC AND ANAEROBIC  Final   Culture   Final           BLOOD CULTURE RECEIVED NO GROWTH TO DATE CULTURE WILL BE HELD FOR 5 DAYS BEFORE ISSUING A FINAL NEGATIVE REPORT Performed at [facility]    Report Status PENDING  Incomplete  Blood Culture (routine x 2)     Status: None (Preliminary result)   Collection Time: 07/20/14 10:09 PM  Result Value Ref Range Status   Specimen Description BLOOD RIGHT HAND  Final   Special Requests BOTTLES DRAWN AEROBIC AND ANAEROBIC  Final   Culture   Final           BLOOD CULTURE RECEIVED NO GROWTH TO DATE CULTURE WILL BE HELD FOR 5 DAYS BEFORE ISSUING A FINAL NEGATIVE REPORT Performed at [facility]    Report Status PENDING  Incomplete  Urine culture     Status: None   Collection Time: 07/20/14 10:52 PM  Result Value Ref Range Status   Specimen Description URINE, CLEAN CATCH  Final   Special Requests NONE  Final   Colony Count NO GROWTH Performed at [facility]   Final   Culture NO GROWTH Performed at [facility]   Final   Report Status 07/21/2014 FINAL  Final    MRSA PCR Screening     Status: Abnormal   Collection Time: 07/21/14  1:00 AM  Result Value Ref Range Status   MRSA by PCR POSITIVE (A) NEGATIVE Final    Comment:  The GeneXpert MRSA Assay (FDA approved for NASAL specimens only), is one component of a comprehensive MRSA colonization surveillance program. It is not intended to diagnose MRSA infection nor to guide or monitor treatment for MRSA infections. RESULT CALLED TO, READ BACK BY AND VERIFIED WITH: B MAY RN AT [phone] ON 06.12.16 BY G [place]     Radiology Reports Dg Ankle Complete Left  06/26/2014   CLINICAL DATA:  [place] 2 days ago with pain and bruising  EXAM: LEFT ANKLE COMPLETE - 3+ VIEW  COMPARISON:  None.  FINDINGS: The ankle joint appears normal. Alignment is normal. No ankle fracture is seen. However, there is a nondisplaced fracture through the base of the left fifth metatarsal.  IMPRESSION: 1. Negative left ankle. 2. Nondisplaced fracture through the base of the left fifth metatarsal.   Electronically Signed   By: Ivar Drape M.D.   On: 06/26/2014 16:56   Dg Chest Port 1 View  07/20/2014   CLINICAL DATA:  Fever cough and tachycardia  EXAM: PORTABLE CHEST - 1 VIEW  COMPARISON:  04/24/2014  FINDINGS: There is patchy opacity in the medial right upper lobe which could represent an acute infiltrate. There is mild generalized vascular prominence. There is no large effusion. Heart size is normal and unchanged.  IMPRESSION: Possible early right upper lobe infiltrate. Mild vascular prominence.   Electronically Signed   By: Andreas Newport M.D.   On: 07/20/2014 22:44   Dg Foot Complete Left  06/26/2014   CLINICAL DATA:  81 year old female with a history of fall 2 days prior. Pain.  EXAM: LEFT FOOT - COMPLETE 3+ VIEW  COMPARISON:  None.  FINDINGS: Acute fracture the base of the left fifth metatarsal. Associated soft tissue swelling. No other acute bony met abnormality identified.  Osteopenia.  Osteoarthritis of the interphalangeal  joints and of the hindfoot.  IMPRESSION: Acute fracture the base of the left fifth metatarsal with associated soft tissue swelling.  Signed,  Dulcy Fanny. Earleen Newport, DO  [facility]  [facility]   Electronically Signed   By: Corrie Mckusick D.O.   On: 06/26/2014 16:55     CBC  Recent Labs Lab 07/20/14 2208 07/21/14 0530  WBC 9.5 6.4  HGB 12.4 11.6*  HCT 37.1 35.3*  PLT 209 193  MCV 85.5 86.5  MCH 28.6 28.4  MCHC 33.4 32.9  RDW 14.0 14.1  LYMPHSABS 1.4  --   MONOABS 1.4*  --   EOSABS 0.0  --   BASOSABS 0.0  --     Chemistries   Recent Labs Lab 07/20/14 2208 07/21/14 0530 07/22/14 0507  NA 127* 130* 131*  K 4.7 4.0 4.3  CL 95* 99* 98*  CO2 22 27 26   GLUCOSE 162* 136* 137*  BUN 15 12 13   CREATININE 0.69 0.76 0.90  CALCIUM 8.5* 8.4* 8.2*  AST 25  --   --   ALT 13*  --   --   ALKPHOS 91  --   --   BILITOT 0.8  --   --    ------------------------------------------------------------------------------------------------------------------ estimated creatinine clearance is 41.9 mL/min (by C-G formula based on Cr of 0.9). ------------------------------------------------------------------------------------------------------------------ No results for input(s): HGBA1C in the last 72 hours. ------------------------------------------------------------------------------------------------------------------ No results for input(s): CHOL, HDL, LDLCALC, TRIG, CHOLHDL, LDLDIRECT in the last 72 hours. ------------------------------------------------------------------------------------------------------------------ No results for input(s): TSH, T4TOTAL, T3FREE, THYROIDAB in the last 72 hours.  Invalid input(s): FREET3 ------------------------------------------------------------------------------------------------------------------ No results for input(s): VITAMINB12, FOLATE, FERRITIN, TIBC, IRON, RETICCTPCT in the  last 72 hours.  Coagulation  profile No results for input(s): INR, PROTIME in the last 168 hours.  No results for input(s): DDIMER in the last 72 hours.  Cardiac Enzymes No results for input(s): CKMB, TROPONINI, MYOGLOBIN in the last 168 hours.  Invalid input(s): CK ------------------------------------------------------------------------------------------------------------------ Invalid input(s): POCBNP     Time Spent in minutes   20 minutes   Misao Fackrell M.D on 07/22/2014 at 2:05 PM  Between 7am to 7pm - Pager - [phone]  After 7pm go to [email or web address] - password [facility]  [facility]   Office  [phone]

## 2014-07-23 LAB — GLUCOSE, CAPILLARY
GLUCOSE-CAPILLARY: 119 mg/dL — AB (ref 65–99)
Glucose-Capillary: 197 mg/dL — ABNORMAL HIGH (ref 65–99)

## 2014-07-23 LAB — CREATININE, SERUM
Creatinine, Ser: 0.75 mg/dL (ref 0.44–1.00)
GFR calc Af Amer: >60 mL/min (ref >60)
GFR calc non Af Amer: >60 mL/min (ref >60)

## 2014-07-23 MED ORDER — LEVOFLOXACIN 500 MG PO TABS: 500.0000 mg | ORAL_TABLET | Freq: Every day | ORAL | Status: AC

## 2014-07-23 NOTE — Progress Notes (Signed)
Rt gave pt flutter valve. Pt knows and understands how to use. 

## 2014-07-23 NOTE — Care Management Note (Signed)
Case Management Note  Patient Details  Name: Lori Long MRN: [id number] Date of Birth: 01-04-1924  Subjective/Objective:                    Action/Plan: Spoke with patient and sister at bedside and confirmed that [facility] will follow up in the home with [facility] services. Kristen with [facility] made aware of referral.  Expected Discharge Date:  07/23/14               Expected Discharge Plan:  [place]  In-House Referral:     Discharge planning Services  CM Consult  Post Acute Care Choice:  Home Health Choice offered to:     DME Arranged:    DME Agency:     HH Arranged:  PT, Nurse's Aide [place] Agency:  [place]  Status of Service:  Completed, signed off  Medicare Important Message Given:  Yes Date Medicare IM Given:  07/23/14 Medicare IM give by:  Leanne Chang Date Additional Medicare IM Given:    Additional Medicare Important Message give by:     If discussed at [place] of Stay Meetings, dates discussed:    Additional Comments:  Scot Dock, RN 07/23/2014, 10:55 AM

## 2014-07-23 NOTE — Evaluation (Signed)
Physical Therapy Evaluation Patient Details Name: Lori Long MRN: [id number] DOB: Apr 14, 1923 Today's Date: 07/23/2014   History of Present Illness  81 yo female admitted with pna, hyponatremia. Hx of falls, HTN, DM, osteopenia, mild dementia, macular degeneration, COPD  Clinical Impression  On eval, pt required Min guard assist for mobility-able to ambulate ~100 feet with RW. Close guard for safety-unsteady at times. Recommend  HHPT at discharge. Daughter present-states family provides 24 hour supervision.    Follow Up Recommendations Home health PT;Supervision/Assistance - 24 hour    Equipment Recommendations  None recommended by PT    Recommendations for Other Services       Precautions / Restrictions Precautions Precautions: Fall Restrictions Weight Bearing Restrictions: No      Mobility  Bed Mobility Overal bed mobility: Needs Assistance Bed Mobility: Supine to Sit;Sit to Supine     Supine to sit: Supervision Sit to supine: Supervision      Transfers Overall transfer level: Needs assistance Equipment used: Rolling walker (2 wheeled) Transfers: Sit to/from Stand Sit to Stand: Min guard         General transfer comment: close guard for safety  Ambulation/Gait Ambulation/Gait assistance: Min guard Ambulation Distance (Feet): 100 Feet Assistive device: Rolling walker (2 wheeled) Gait Pattern/deviations: Step-through pattern;Decreased stride length     General Gait Details: close guard for safety  Stairs            Wheelchair Mobility    Modified Rankin (Stroke Patients Only)       Balance Overall balance assessment: History of Falls;Needs assistance         Standing balance support: Bilateral upper extremity supported;During functional activity Standing balance-Leahy Scale: Poor Standing balance comment: needs RW                             Pertinent Vitals/Pain Pain Assessment: Faces Faces Pain Scale: Hurts a  little bit Pain Location: bil knees    Home Living Family/patient expects to be discharged to:: Private residence Living Arrangements: Children Available Help at Discharge: Available 24 hours/day;Family Type of Home: House Home Access: Level entry     Home Layout: One level Home Equipment: Environmental consultant - 2 wheels;Cane - single point;Bedside commode;Shower seat Additional Comments: family is rotating to provide 24/7    Prior Function Level of Independence: Needs assistance   Gait / Transfers Assistance Needed: uses walker  ADL's / Homemaking Assistance Needed: meals, medicine management. Pt able to bathe/dress herself.         Hand Dominance        Extremity/Trunk Assessment   Upper Extremity Assessment: Generalized weakness           Lower Extremity Assessment: Generalized weakness      Cervical / Trunk Assessment: Kyphotic  Communication   Communication: No difficulties  Cognition Arousal/Alertness: Awake/alert Behavior During Therapy: WFL for tasks assessed/performed Overall Cognitive Status: Within Functional Limits for tasks assessed                      General Comments      Exercises        Assessment/Plan    PT Assessment All further PT needs can be met in the next venue of care (HHPT)  PT Diagnosis Difficulty walking;Generalized weakness   PT Problem List Decreased strength;Decreased activity tolerance;Decreased balance;Decreased mobility  PT Treatment Interventions     PT Goals (Current goals can be found in the Care  Plan section) Acute Rehab PT Goals Patient Stated Goal: home PT Goal Formulation: All assessment and education complete, DC therapy    Frequency     Barriers to discharge        Co-evaluation               End of Session   Activity Tolerance: Patient tolerated treatment well Patient left: in bed;with call bell/phone within reach;with family/visitor present           Time: 1941-7408 PT Time Calculation  (min) (ACUTE ONLY): 11 min   Charges:   PT Evaluation $Initial PT Evaluation Tier I: 1 Procedure     PT G Codes:        Weston Anna, MPT Pager: [phone]

## 2014-07-23 NOTE — Discharge Instructions (Signed)
Follow with Primary MD Marjorie Smolder, MD in 7 days   Get CBC, CMP, 2 view Chest X ray checked  by Primary MD next visit.    Activity: As tolerated with Full fall precautions use walker/cane & assistance as needed   Disposition Home    Diet: Heart Healthy  , with feeding assistance and aspiration precautions.  For Heart failure patients - Check your Weight same time everyday, if you gain over 2 pounds, or you develop in leg swelling, experience more shortness of breath or chest pain, call your Primary MD immediately. Follow Cardiac Low Salt Diet and 1.5 lit/day fluid restriction.   On your next visit with your primary care physician please Get Medicines reviewed and adjusted.   Please request your Prim.MD to go over all Hospital Tests and Procedure/Radiological results at the follow up, please get all Hospital records sent to your Prim MD by signing hospital release before you go home.   If you experience worsening of your admission symptoms, develop shortness of breath, life threatening emergency, suicidal or homicidal thoughts you must seek medical attention immediately by calling 911 or calling your MD immediately  if symptoms less severe.  You Must read complete instructions/literature along with all the possible adverse reactions/side effects for all the Medicines you take and that have been prescribed to you. Take any new Medicines after you have completely understood and accpet all the possible adverse reactions/side effects.   Do not drive, operating heavy machinery, perform activities at heights, swimming or participation in water activities or provide baby sitting services if your were admitted for syncope or siezures until you have seen by Primary MD or a Neurologist and advised to do so again.  Do not drive when taking Pain medications.    Do not take more than prescribed Pain, Sleep and Anxiety Medications  Special Instructions: If you have smoked or chewed Tobacco  in  the last 2 yrs please stop smoking, stop any regular Alcohol  and or any Recreational drug use.  Wear Seat belts while driving.   Please note  You were cared for by a hospitalist during your hospital stay. If you have any questions about your discharge medications or the care you received while you were in the hospital after you are discharged, you can call the unit and asked to speak with the hospitalist on call if the hospitalist that took care of you is not available. Once you are discharged, your primary care physician will handle any further medical issues. Please note that NO REFILLS for any discharge medications will be authorized once you are discharged, as it is imperative that you return to your primary care physician (or establish a relationship with a primary care physician if you do not have one) for your aftercare needs so that they can reassess your need for medications and monitor your lab values.

## 2014-07-23 NOTE — Discharge Summary (Signed)
Lori Long, is a 81 y.o. female  DOB [birth year]  MRN [id number].  Admission date:  07/20/2014  Admitting Physician  Truett Mainland, DO  Discharge Date:  07/23/2014   Primary MD  Marjorie Smolder, MD  Recommendations for primary care physician for things to follow:  - Check CBC, BMP during next visit, check chest x-ray in 4 weeks.   Admission Diagnosis  HCAP (healthcare-associated pneumonia) [J18.9]   Discharge Diagnosis  HCAP (healthcare-associated pneumonia) [J18.9]    Active Problems:   Hyponatremia   HCAP (healthcare-associated pneumonia)      Past Medical History  Diagnosis Date  . Coronary artery disease   . Hypertension   . Arthritis   . Diabetes mellitus   . Renal disorder   . Sleep apnea   . Thyroid disease   . Glaucoma   . Osteopenia   . Elevated cholesterol   . Back pain   . PUD (peptic ulcer disease)   . Macular degeneration   . Typhoid fever   . COPD (chronic obstructive pulmonary disease)   . Dementia   . Back pain   . HCAP (healthcare-associated pneumonia) 07/20/2014    Past Surgical History  Procedure Laterality Date  . Cholecystectomy    . Abdominal hysterectomy    . Cataract extraction    . Back surgery    . Thyroid surgery         History of present illness and  Hospital Course:     Kindly see H&P for history of present illness and admission details, please review complete Labs, Consult reports and Test reports for all details in brief  HPI  from the history and physical done on the day of admission  Lori Long is a 81 y.o. female  With a history of diabetes, hypertension, COPD, mild dementia, coronary artery disease, hypothyroidism. She presents to the hospital after 3 days of worsening cough with white sputum production and a fever that started today. She was brought by EMS due to elevated blood pressure, hypertension, and tachycardia.  She has also had shortness of breath with ambulation that was improved with rest. No other palliating or provoking factors. She was hospitalized within the past 90 days for greater than 2 days.  Hospital Course  81 year old female with known history of diabetes, hypertension, COPD, mild dementia, coronary artery disease, hypothyroidism, presents with complaints of cough, productive sputum and fever, chest x-ray significant for right upper lobe infiltrate, been treated for HCAP( hospital stay within 90 days)  Healthcare acquired pneumonia : - Recent hospitalization, chest x-ray showing right upper infiltrate, treated with IV vancomycin and Fortaz for 3 days, to continue another 4 days of oral levofloxacin as an outpatient. - Blood cultures no growth to date  -  sputum culture no growth, Legionella antigen nonreactive, strep B nonreactive   Hyponatremia  - Improved with IV normal saline, sodium is 131 on discharge,  Hypertension -  continue with home medication.  Hypothyroidism - Continue with Synthroid  Diabetes mellitus - Continue  glipizide  Hyperlipidemia - Continue with statin   Discharge Condition: Stable   Follow UP  Follow-up Information    Follow up with Marjorie Smolder, MD. Schedule an appointment as soon as possible for a visit in 1 week.   Specialty:  Family Medicine   Why:  Posthospitalization follow-up for pneumonia   Contact information:   [address] [place] [address] [phone]         Discharge Instructions  and  Discharge Medications     Discharge Instructions    Diet - low sodium heart healthy    Complete by:  As directed      Discharge instructions    Complete by:  As directed   Follow with Primary MD Marjorie Smolder, MD in 7 days   Get CBC, CMP, 2 view Chest X ray checked  by Primary MD next visit.    Activity: As tolerated with Full fall precautions use walker/cane & assistance as needed   Disposition Home  **   Diet: Heart Healthy ** , with feeding assistance and aspiration precautions.  For Heart failure patients - Check your Weight same time everyday, if you gain over 2 pounds, or you develop in leg swelling, experience more shortness of breath or chest pain, call your Primary MD immediately. Follow Cardiac Low Salt Diet and 1.5 lit/day fluid restriction.   On your next visit with your primary care physician please Get Medicines reviewed and adjusted.   Please request your Prim.MD to go over all Hospital Tests and Procedure/Radiological results at the follow up, please get all Hospital records sent to your Prim MD by signing hospital release before you go home.   If you experience worsening of your admission symptoms, develop shortness of breath, life threatening emergency, suicidal or homicidal thoughts you must seek medical attention immediately by calling 911 or calling your MD immediately  if symptoms less severe.  You Must read complete instructions/literature along with all the possible adverse reactions/side effects for all the Medicines you take and that have been prescribed to you. Take any new Medicines after you have completely understood and accpet all the possible adverse reactions/side effects.   Do not drive, operating heavy machinery, perform activities at heights, swimming or participation in water activities or provide baby sitting services if your were admitted for syncope or siezures until you have seen by Primary MD or a Neurologist and advised to do so again.  Do not drive when taking Pain medications.    Do not take more than prescribed Pain, Sleep and Anxiety Medications  Special Instructions: If you have smoked or chewed Tobacco  in the last 2 yrs please stop smoking, stop any regular Alcohol  and or any Recreational drug use.  Wear Seat belts while driving.   Please note  You were cared for by a hospitalist during your hospital stay. If you have any questions  about your discharge medications or the care you received while you were in the hospital after you are discharged, you can call the unit and asked to speak with the hospitalist on call if the hospitalist that took care of you is not available. Once you are discharged, your primary care physician will handle any further medical issues. Please note that NO REFILLS for any discharge medications will be authorized once you are discharged, as it is imperative that you return to your primary care physician (or establish a relationship with a primary care physician if you do not have  one) for your aftercare needs so that they can reassess your need for medications and monitor your lab values.     Increase activity slowly    Complete by:  As directed             Medication List    STOP taking these medications        oxyCODONE-acetaminophen 10-325 MG per tablet  Commonly known as:  PERCOCET      TAKE these medications        acetaminophen 650 MG CR tablet  Commonly known as:  TYLENOL  Take 1,300 mg by mouth 2 (two) times daily.     amLODipine 5 MG tablet  Commonly known as:  NORVASC  Take 5 mg by mouth every morning.     cholecalciferol 1000 UNITS tablet  Commonly known as:  VITAMIN D  Take 2,000 Units by mouth every morning.     cloNIDine 0.1 MG tablet  Commonly known as:  CATAPRES  Take 1 tablet by mouth at bedtime.     COMBIGAN 0.2-0.5 % ophthalmic solution  Generic drug:  brimonidine-timolol  Place 1 drop into both eyes every 12 (twelve) hours.     docusate sodium 100 MG capsule  Commonly known as:  COLACE  Take 100 mg by mouth daily.     fish oil-omega-3 fatty acids 1000 MG capsule  Take 1 g by mouth every morning.     gabapentin 250 MG/5ML solution  Commonly known as:  NEURONTIN  Take 500-1,000 mg by mouth 3 (three) times daily. Take 65m= 5057min the morning and afternoon, and take 2055mr 1000m58m bedtime     glipiZIDE 10 MG tablet  Commonly known as:  GLUCOTROL    Take 10 mg by mouth daily before breakfast.     HYDROcodone-acetaminophen 10-325 MG per tablet  Commonly known as:  NORCO  Take 1 tablet by mouth every 8 (eight) hours as needed.     levofloxacin 500 MG tablet  Commonly known as:  LEVAQUIN  Take 1 tablet (500 mg total) by mouth daily.     levothyroxine 100 MCG tablet  Commonly known as:  SYNTHROID, LEVOTHROID  Take 100 mcg by mouth daily with breakfast.     LORazepam 0.5 MG tablet  Commonly known as:  ATIVAN  Take 1 tablet by mouth at bedtime.     memantine 5 MG tablet  Commonly known as:  NAMENDA  Take 5 mg by mouth 2 (two) times daily.     metoprolol tartrate 25 MG tablet  Commonly known as:  LOPRESSOR  Take 50 mg by mouth 2 (two) times daily.     omeprazole 20 MG capsule  Commonly known as:  PRILOSEC  Take 40 mg by mouth daily.     pravastatin 40 MG tablet  Commonly known as:  PRAVACHOL  Take 40 mg by mouth every evening.     PRESERVISION/LUTEIN PO  Take 1 tablet by mouth every morning.     SYSTANE BALANCE 0.6 % Soln  Generic drug:  Propylene Glycol  Place 1 drop into both eyes daily as needed (dry eyes).     valsartan 320 MG tablet  Commonly known as:  DIOVAN  Take 0.5 tablets (160 mg total) by mouth every morning.     vitamin B-12 1000 MCG tablet  Commonly known as:  CYANOCOBALAMIN  Take 2,000 mcg by mouth every morning.          Diet and Activity recommendation: See Discharge Instructions  above   Consults obtained - none   Major procedures and Radiology Reports - PLEASE review detailed and final reports for all details, in brief -      Dg Ankle Complete Left  06/26/2014   CLINICAL DATA:  [place] 2 days ago with pain and bruising  EXAM: LEFT ANKLE COMPLETE - 3+ VIEW  COMPARISON:  None.  FINDINGS: The ankle joint appears normal. Alignment is normal. No ankle fracture is seen. However, there is a nondisplaced fracture through the base of the left fifth metatarsal.  IMPRESSION: 1. Negative left  ankle. 2. Nondisplaced fracture through the base of the left fifth metatarsal.   Electronically Signed   By: Ivar Drape M.D.   On: 06/26/2014 16:56   Dg Chest Port 1 View  07/20/2014   CLINICAL DATA:  Fever cough and tachycardia  EXAM: PORTABLE CHEST - 1 VIEW  COMPARISON:  04/24/2014  FINDINGS: There is patchy opacity in the medial right upper lobe which could represent an acute infiltrate. There is mild generalized vascular prominence. There is no large effusion. Heart size is normal and unchanged.  IMPRESSION: Possible early right upper lobe infiltrate. Mild vascular prominence.   Electronically Signed   By: Andreas Newport M.D.   On: 07/20/2014 22:44   Dg Foot Complete Left  06/26/2014   CLINICAL DATA:  81 year old female with a history of fall 2 days prior. Pain.  EXAM: LEFT FOOT - COMPLETE 3+ VIEW  COMPARISON:  None.  FINDINGS: Acute fracture the base of the left fifth metatarsal. Associated soft tissue swelling. No other acute bony met abnormality identified.  Osteopenia.  Osteoarthritis of the interphalangeal joints and of the hindfoot.  IMPRESSION: Acute fracture the base of the left fifth metatarsal with associated soft tissue swelling.  Signed,  Dulcy Fanny. Earleen Newport, DO  [facility]  [facility]   Electronically Signed   By: Corrie Mckusick D.O.   On: 06/26/2014 16:55    Micro Results    Recent Results (from the past 240 hour(s))  Blood Culture (routine x 2)     Status: None (Preliminary result)   Collection Time: 07/20/14 10:08 PM  Result Value Ref Range Status   Specimen Description BLOOD LEFT WRIST  Final   Special Requests BOTTLES DRAWN AEROBIC AND ANAEROBIC  Final   Culture   Final           BLOOD CULTURE RECEIVED NO GROWTH TO DATE CULTURE WILL BE HELD FOR 5 DAYS BEFORE ISSUING A FINAL NEGATIVE REPORT Performed at [facility]    Report Status PENDING  Incomplete  Blood Culture (routine x 2)     Status: None (Preliminary result)    Collection Time: 07/20/14 10:09 PM  Result Value Ref Range Status   Specimen Description BLOOD RIGHT HAND  Final   Special Requests BOTTLES DRAWN AEROBIC AND ANAEROBIC  Final   Culture   Final           BLOOD CULTURE RECEIVED NO GROWTH TO DATE CULTURE WILL BE HELD FOR 5 DAYS BEFORE ISSUING A FINAL NEGATIVE REPORT Performed at [facility]    Report Status PENDING  Incomplete  Urine culture     Status: None   Collection Time: 07/20/14 10:52 PM  Result Value Ref Range Status   Specimen Description URINE, CLEAN CATCH  Final   Special Requests NONE  Final   Colony Count NO GROWTH Performed at [facility]   Final   Culture NO GROWTH  Performed at [facility]   Final   Report Status 07/21/2014 FINAL  Final  MRSA PCR Screening     Status: Abnormal   Collection Time: 07/21/14  1:00 AM  Result Value Ref Range Status   MRSA by PCR POSITIVE (A) NEGATIVE Final    Comment:        The GeneXpert MRSA Assay (FDA approved for NASAL specimens only), is one component of a comprehensive MRSA colonization surveillance program. It is not intended to diagnose MRSA infection nor to guide or monitor treatment for MRSA infections. RESULT CALLED TO, READ BACK BY AND VERIFIED WITH: B MAY RN AT [phone] ON 06.12.16 BY G KONTOS        Today   Subjective:   Jenice Leiner today has no headache,no chest abdominal pain,no new weakness tingling or numbness, feels much better wants to go home today. Cough still present, but significantly improved  Objective:   Blood pressure 184/64, pulse 60, temperature 98.1 F (36.7 C), temperature source Oral, resp. rate 20, height _0  (1.651 m), weight 70.9 kg (156 lb 4.9 oz), SpO2 96 %.   Intake/Output Summary (Last 24 hours) at 07/23/14 0955 Last data filed at 07/23/14 0932  Gross per 24 hour  Intake   1110 ml  Output   1700 ml  Net   -590 ml    Exam Awake Alert, Oriented , No new F.N deficits,  [place].AT,PERRAL Supple  Neck,No JVD, No cervical lymphadenopathy appriciated.  Symmetrical Chest wall movement, Good air movement bilaterally,  RRR,No Gallops,Rubs or new Murmurs, No Parasternal Heave +ve B.Sounds, Abd Soft, No tenderness, No organomegaly appriciated, No rebound - guarding or rigidity. No Cyanosis, Clubbing or edema, No new Rash or bruise   Data Review   CBC w Diff: Lab Results  Component Value Date   WBC 6.4 07/21/2014   HGB 11.6* 07/21/2014   HCT 35.3* 07/21/2014   PLT 193 07/21/2014   LYMPHOPCT 15 07/20/2014   MONOPCT 14* 07/20/2014   EOSPCT 0 07/20/2014   BASOPCT 0 07/20/2014    CMP: Lab Results  Component Value Date   NA 131* 07/22/2014   K 4.3 07/22/2014   CL 98* 07/22/2014   CO2 26 07/22/2014   BUN 13 07/22/2014   CREATININE 0.75 07/23/2014   PROT 6.7 07/20/2014   ALBUMIN 3.5 07/20/2014   BILITOT 0.8 07/20/2014   ALKPHOS 91 07/20/2014   AST 25 07/20/2014   ALT 13* 07/20/2014  .   Total Time in preparing paper work, data evaluation and todays exam - 35 minutes  Nickayla Mcinnis M.D on 07/23/2014 at 9:55 AM  [facility]   Office  [phone]

## 2014-07-27 LAB — CULTURE, BLOOD (ROUTINE X 2)
Culture: NO GROWTH
Culture: NO GROWTH

## 2014-07-29 ENCOUNTER — Ambulatory Visit
Admission: RE | Admit: 2014-07-29 | Discharge: 2014-07-29 | Disposition: A | Discharge status: Home / Self Care | Payer: Medicare Other | Source: Ambulatory Visit | Attending: Family Medicine | Admitting: Family Medicine

## 2014-07-29 ENCOUNTER — Other Ambulatory Visit: Payer: Self-pay | Admitting: Family Medicine

## 2014-07-29 DIAGNOSIS — Z09 Encounter for follow-up examination after completed treatment for conditions other than malignant neoplasm: Secondary | ICD-10-CM

## 2014-08-22 ENCOUNTER — Emergency Department (INDEPENDENT_AMBULATORY_CARE_PROVIDER_SITE_OTHER)
Admission: EM | Admit: 2014-08-22 | Discharge: 2014-08-22 | Disposition: A | Discharge status: Home / Self Care | Payer: Medicare Other | Source: Home / Self Care | Attending: Family Medicine | Admitting: Family Medicine

## 2014-08-22 ENCOUNTER — Encounter (HOSPITAL_COMMUNITY): Payer: Self-pay | Admitting: Emergency Medicine

## 2014-08-22 ENCOUNTER — Emergency Department (INDEPENDENT_AMBULATORY_CARE_PROVIDER_SITE_OTHER): Payer: Medicare Other

## 2014-08-22 DIAGNOSIS — E118 Type 2 diabetes mellitus with unspecified complications: Secondary | ICD-10-CM

## 2014-08-22 DIAGNOSIS — J441 Chronic obstructive pulmonary disease with (acute) exacerbation: Secondary | ICD-10-CM

## 2014-08-22 MED ORDER — IPRATROPIUM-ALBUTEROL 0.5-2.5 (3) MG/3ML IN SOLN
RESPIRATORY_TRACT | Status: AC
  Filled 2014-08-22: qty 3

## 2014-08-22 MED ORDER — PREDNISONE 50 MG PO TABS: 50.0000 mg | ORAL_TABLET | Freq: Once | ORAL | Status: DC

## 2014-08-22 MED ORDER — DOXYCYCLINE HYCLATE 100 MG PO CAPS: 100.0000 mg | ORAL_CAPSULE | Freq: Two times a day (BID) | ORAL | Status: DC

## 2014-08-22 MED ORDER — PREDNISONE 20 MG PO TABS
60.0000 mg | ORAL_TABLET | Freq: Once | ORAL | Status: AC
  Administered 2014-08-22: 60 mg via ORAL

## 2014-08-22 MED ORDER — ALBUTEROL SULFATE HFA 108 (90 BASE) MCG/ACT IN AERS: 2.0000 | INHALATION_SPRAY | Freq: Four times a day (QID) | RESPIRATORY_TRACT | Status: AC | PRN

## 2014-08-22 MED ORDER — IPRATROPIUM-ALBUTEROL 0.5-2.5 (3) MG/3ML IN SOLN
3.0000 mL | Freq: Once | RESPIRATORY_TRACT | Status: AC
  Administered 2014-08-22: 3 mL via RESPIRATORY_TRACT

## 2014-08-22 MED ORDER — PREDNISONE 20 MG PO TABS
ORAL_TABLET | ORAL | Status: AC
  Filled 2014-08-22: qty 3

## 2014-08-22 NOTE — ED Provider Notes (Signed)
CSN: [id number]     Arrival date & time 08/22/14  1450 History   First MD Initiated Contact with Patient 08/22/14 1551     Chief Complaint  Patient presents with  . Cough   (Consider location/radiation/quality/duration/timing/severity/associated sxs/prior Treatment) HPI   2 weeks ago pt developed cough. Occasionally productive. General feeling. Denies fevers, chest pain, palpitations, neck stiffness, true shortness of breath, leg swelling, LOC. Patient does not have any home inhalers. Patient has not tried anything to make symptoms better. Symptoms are progressively getting worse and are intermittent.  Past Medical History  Diagnosis Date  . Coronary artery disease   . Hypertension   . Arthritis   . Diabetes mellitus   . Renal disorder   . Sleep apnea   . Thyroid disease   . Glaucoma   . Osteopenia   . Elevated cholesterol   . Back pain   . PUD (peptic ulcer disease)   . Macular degeneration   . Typhoid fever   . COPD (chronic obstructive pulmonary disease)   . Dementia   . Back pain   . HCAP (healthcare-associated pneumonia) 07/20/2014   Past Surgical History  Procedure Laterality Date  . Cholecystectomy    . Abdominal hysterectomy    . Cataract extraction    . Back surgery    . Thyroid surgery     Family History  Problem Relation Age of Onset  . Leukemia Mother   . Pneumonia Father   . Diabetes Brother    History  Substance Use Topics  . Smoking status: Never Smoker   . Smokeless tobacco: Not on file  . Alcohol Use: No   OB History    No data available     Review of Systems Per HPI with all other pertinent systems negative.   Allergies  Clonidine derivatives; Ezetimibe-simvastatin; Lipitor; Metformin and related; Morphine and related; and Codeine  Home Medications   Prior to Admission medications   Medication Sig Start Date End Date Taking? Authorizing Provider  acetaminophen (TYLENOL) 650 MG CR tablet Take 1,300 mg by mouth 2 (two) times daily.     Historical Provider, MD  amLODipine (NORVASC) 5 MG tablet Take 5 mg by mouth every morning.    Historical Provider, MD  brimonidine-timolol (COMBIGAN) 0.2-0.5 % ophthalmic solution Place 1 drop into both eyes every 12 (twelve) hours.    Historical Provider, MD  cholecalciferol (VITAMIN D) 1000 UNITS tablet Take 2,000 Units by mouth every morning.     Historical Provider, MD  cloNIDine (CATAPRES) 0.1 MG tablet Take 1 tablet by mouth at bedtime. 04/08/14   Historical Provider, MD  docusate sodium (COLACE) 100 MG capsule Take 100 mg by mouth daily.    Historical Provider, MD  fish oil-omega-3 fatty acids 1000 MG capsule Take 1 g by mouth every morning.     Historical Provider, MD  gabapentin (NEURONTIN) 250 MG/5ML solution Take 500-1,000 mg by mouth 3 (three) times daily. Take 24ml= 500mg  in the morning and afternoon, and take 37ml or 1000mg  at bedtime    Historical Provider, MD  glipiZIDE (GLUCOTROL) 10 MG tablet Take 10 mg by mouth daily before breakfast.    Historical Provider, MD  HYDROcodone-acetaminophen (NORCO) 10-325 MG per tablet Take 1 tablet by mouth every 8 (eight) hours as needed. 04/12/14   Historical Provider, MD  levothyroxine (SYNTHROID, LEVOTHROID) 100 MCG tablet Take 100 mcg by mouth daily with breakfast.     Historical Provider, MD  LORazepam (ATIVAN) 0.5 MG tablet Take 1 tablet  by mouth at bedtime. 04/08/14   Historical Provider, MD  memantine (NAMENDA) 5 MG tablet Take 5 mg by mouth 2 (two) times daily.    Historical Provider, MD  metoprolol tartrate (LOPRESSOR) 25 MG tablet Take 50 mg by mouth 2 (two) times daily.  02/18/13   Historical Provider, MD  Multiple Vitamins-Minerals (PRESERVISION/LUTEIN PO) Take 1 tablet by mouth every morning.     Historical Provider, MD  omeprazole (PRILOSEC) 20 MG capsule Take 40 mg by mouth daily.    Historical Provider, MD  pravastatin (PRAVACHOL) 40 MG tablet Take 40 mg by mouth every evening.     Historical Provider, MD  Propylene Glycol (SYSTANE  BALANCE) 0.6 % SOLN Place 1 drop into both eyes daily as needed (dry eyes).    Historical Provider, MD  valsartan (DIOVAN) 320 MG tablet Take 0.5 tablets (160 mg total) by mouth every morning. 04/26/14   Nita Sells, MD  vitamin B-12 (CYANOCOBALAMIN) 1000 MCG tablet Take 2,000 mcg by mouth every morning.     Historical Provider, MD   BP 163/58 mmHg  Pulse 87  Temp(Src) 98.6 F (37 C) (Oral)  Resp 20  SpO2 98% Physical Exam Physical Exam  Constitutional: oriented to person, place, and time. appears well-developed and well-nourished. No distress.  HENT:  Head: Normocephalic and atraumatic.  Eyes: EOMI. PERRL.  Neck: Normal range of motion.  Cardiovascular: RRR, no m/r/g, 2+ distal pulses,  Pulmonary/Chest: Increased respiratory effort associated with wheezing, after DuoNeb patient's risk for status is significantly improved and lungs are clear to auscultation bilaterally..  Abdominal: Soft. Bowel sounds are normal. NonTTP, no distension.  Musculoskeletal: Normal range of motion. Non ttp, no effusion.  Neurological: alert and oriented to person, place, and time.  Skin: Skin is warm. No rash noted. non diaphoretic.  Psychiatric: normal mood and affect. behavior is normal. Judgment and thought content normal.   ED Course  Procedures (including critical care time) Labs Review Labs Reviewed - No data to display  Imaging Review Dg Chest 2 View  08/22/2014   ADDENDUM REPORT: 08/22/2014 16:26  ADDENDUM: There are surgical clips in the right neck region.   Electronically Signed   By: Lowella Grip III M.D.   On: 08/22/2014 16:26   08/22/2014   CLINICAL DATA:  Cough and chest tightness  EXAM: CHEST  2 VIEW  COMPARISON:  July 29, 2014  FINDINGS: The previously noted right upper lobe infiltrate has cleared. Currently there is no edema or consolidation. The heart size and pulmonary vascularity are normal. No adenopathy. There is atherosclerotic change in the aorta.  IMPRESSION: Lungs now  clear.  Atherosclerotic change in aorta.  No adenopathy.  Electronically Signed: By: Lowella Grip III M.D. On: 08/22/2014 16:21     MDM   1. COPD exacerbation   2. Diabetes mellitus type 2 with complications    Prednisone 60 mg and DuoNeb given in clinic with significant improvement of symptoms. Patient states she feels much better now. Per prescription given for inhaler, prednisone and doxycycline. Patient aware that prednisone will make her sugars go high. Typically sugars are in the 150s. Patient to increase her glipizide dose if sugars go above 300 or feels poorly. Patient to stop prednisone if becomes above 300 or go to the emergency room.    Waldemar Dickens, MD 08/22/14 [phone]

## 2014-08-22 NOTE — Discharge Instructions (Signed)
You are suffering from a COPD flare. Please use the albuterol inhaler every 4 hours for the next 24 hours then every 4 hours as needed. Please take the steroids every morning. Please check your sugars regularly as the steroids will make them go high. If your sugar goes above 300 or they make you feel poorly stop them. Please take antibiotics as prescribed.

## 2014-12-24 ENCOUNTER — Encounter (HOSPITAL_COMMUNITY): Payer: Self-pay | Admitting: Emergency Medicine

## 2014-12-24 ENCOUNTER — Emergency Department (HOSPITAL_COMMUNITY)
Admission: EM | Admit: 2014-12-24 | Discharge: 2014-12-25 | Disposition: A | Discharge status: Home / Self Care | Payer: Medicare Other | Attending: Emergency Medicine | Admitting: Emergency Medicine

## 2014-12-24 ENCOUNTER — Emergency Department (HOSPITAL_COMMUNITY): Payer: Medicare Other

## 2014-12-24 DIAGNOSIS — M1990 Unspecified osteoarthritis, unspecified site: Secondary | ICD-10-CM | POA: Diagnosis not present

## 2014-12-24 DIAGNOSIS — E079 Disorder of thyroid, unspecified: Secondary | ICD-10-CM | POA: Diagnosis not present

## 2014-12-24 DIAGNOSIS — J449 Chronic obstructive pulmonary disease, unspecified: Secondary | ICD-10-CM | POA: Diagnosis not present

## 2014-12-24 DIAGNOSIS — Z8619 Personal history of other infectious and parasitic diseases: Secondary | ICD-10-CM | POA: Diagnosis not present

## 2014-12-24 DIAGNOSIS — J019 Acute sinusitis, unspecified: Secondary | ICD-10-CM | POA: Insufficient documentation

## 2014-12-24 DIAGNOSIS — E7800 Pure hypercholesterolemia, unspecified: Secondary | ICD-10-CM | POA: Diagnosis not present

## 2014-12-24 DIAGNOSIS — M8580 Other specified disorders of bone density and structure, unspecified site: Secondary | ICD-10-CM | POA: Diagnosis not present

## 2014-12-24 DIAGNOSIS — Y998 Other external cause status: Secondary | ICD-10-CM | POA: Insufficient documentation

## 2014-12-24 DIAGNOSIS — Z8701 Personal history of pneumonia (recurrent): Secondary | ICD-10-CM | POA: Diagnosis not present

## 2014-12-24 DIAGNOSIS — W01198A Fall on same level from slipping, tripping and stumbling with subsequent striking against other object, initial encounter: Secondary | ICD-10-CM | POA: Diagnosis not present

## 2014-12-24 DIAGNOSIS — Z8742 Personal history of other diseases of the female genital tract: Secondary | ICD-10-CM | POA: Diagnosis not present

## 2014-12-24 DIAGNOSIS — Y92009 Unspecified place in unspecified non-institutional (private) residence as the place of occurrence of the external cause: Secondary | ICD-10-CM | POA: Insufficient documentation

## 2014-12-24 DIAGNOSIS — Z79899 Other long term (current) drug therapy: Secondary | ICD-10-CM | POA: Diagnosis not present

## 2014-12-24 DIAGNOSIS — H409 Unspecified glaucoma: Secondary | ICD-10-CM | POA: Diagnosis not present

## 2014-12-24 DIAGNOSIS — Z8659 Personal history of other mental and behavioral disorders: Secondary | ICD-10-CM

## 2014-12-24 DIAGNOSIS — F0390 Unspecified dementia without behavioral disturbance: Secondary | ICD-10-CM | POA: Diagnosis not present

## 2014-12-24 DIAGNOSIS — E119 Type 2 diabetes mellitus without complications: Secondary | ICD-10-CM | POA: Insufficient documentation

## 2014-12-24 DIAGNOSIS — Z7984 Long term (current) use of oral hypoglycemic drugs: Secondary | ICD-10-CM | POA: Insufficient documentation

## 2014-12-24 DIAGNOSIS — W19XXXA Unspecified fall, initial encounter: Secondary | ICD-10-CM

## 2014-12-24 DIAGNOSIS — I10 Essential (primary) hypertension: Secondary | ICD-10-CM | POA: Diagnosis not present

## 2014-12-24 DIAGNOSIS — Y9389 Activity, other specified: Secondary | ICD-10-CM | POA: Diagnosis not present

## 2014-12-24 DIAGNOSIS — R531 Weakness: Secondary | ICD-10-CM | POA: Insufficient documentation

## 2014-12-24 DIAGNOSIS — I2510 Atherosclerotic heart disease of native coronary artery without angina pectoris: Secondary | ICD-10-CM | POA: Insufficient documentation

## 2014-12-24 DIAGNOSIS — S0990XA Unspecified injury of head, initial encounter: Secondary | ICD-10-CM | POA: Diagnosis present

## 2014-12-24 LAB — BASIC METABOLIC PANEL
Anion gap: 14 (ref 5–15)
BUN: 12 mg/dL (ref 6–20)
CHLORIDE: 99 mmol/L — AB (ref 101–111)
CO2: 22 mmol/L (ref 22–32)
CREATININE: 0.85 mg/dL (ref 0.44–1.00)
Calcium: 9.8 mg/dL (ref 8.9–10.3)
GFR calc Af Amer: >60 mL/min (ref >60)
GFR calc non Af Amer: 59 mL/min — ABNORMAL LOW (ref >60)
Glucose, Bld: 142 mg/dL — ABNORMAL HIGH (ref 65–99)
Potassium: 4 mmol/L (ref 3.5–5.1)
SODIUM: 135 mmol/L (ref 135–145)

## 2014-12-24 LAB — CBC WITH DIFFERENTIAL/PLATELET
Basophils Absolute: 0 10*3/uL (ref 0.0–0.1)
Basophils Relative: 0 %
EOS ABS: 0.1 10*3/uL (ref 0.0–0.7)
EOS PCT: 1 %
HCT: 41.2 % (ref 36.0–46.0)
HEMOGLOBIN: 14.2 g/dL (ref 12.0–15.0)
LYMPHS PCT: 28 %
Lymphs Abs: 3.5 10*3/uL (ref 0.7–4.0)
MCH: 30.3 pg (ref 26.0–34.0)
MCHC: 34.5 g/dL (ref 30.0–36.0)
MCV: 87.8 fL (ref 78.0–100.0)
MONOS PCT: 9 %
Monocytes Absolute: 1.1 10*3/uL — ABNORMAL HIGH (ref 0.1–1.0)
Neutro Abs: 7.5 10*3/uL (ref 1.7–7.7)
Neutrophils Relative %: 62 %
PLATELETS: 254 10*3/uL (ref 150–400)
RBC: 4.69 MIL/uL (ref 3.87–5.11)
RDW: 13.5 % (ref 11.5–15.5)
WBC: 12.2 10*3/uL — ABNORMAL HIGH (ref 4.0–10.5)

## 2014-12-24 LAB — I-STAT TROPONIN, ED: TROPONIN I, POC: 0.01 ng/mL (ref 0.00–0.08)

## 2014-12-24 MED ORDER — SODIUM CHLORIDE 0.9 % IV BOLUS (SEPSIS)
1000.0000 mL | Freq: Once | INTRAVENOUS | Status: AC
  Administered 2014-12-24: 1000 mL via INTRAVENOUS

## 2014-12-24 NOTE — ED Notes (Signed)
MD at bedside. 

## 2014-12-24 NOTE — ED Provider Notes (Signed)
CSN: [id number]     Arrival date & time 12/24/14  2045 History   First MD Initiated Contact with Patient 12/24/14 2100     Chief Complaint  Patient presents with  . Fall   HISTORY, ROS, AND PHYSICAL EXAM LIMITED BY MENTAL STATUS CHANGE OF PATIENT   (Consider location/radiation/quality/duration/timing/severity/associated sxs/prior Treatment) Patient is a 81 y.o. female presenting with fall. The history is provided by the patient and a relative. The history is limited by the condition of the patient.  Fall This is a recurrent problem. The current episode started in the past 7 days. The problem occurs rarely. The problem has been unchanged. Associated symptoms include headaches and weakness. Pertinent negatives include no numbness, visual change or vomiting. Nothing aggravates the symptoms. She has tried rest for the symptoms. The treatment provided mild relief.    Past Medical History  Diagnosis Date  . Coronary artery disease   . Hypertension   . Arthritis   . Diabetes mellitus   . Renal disorder   . Sleep apnea   . Thyroid disease   . Glaucoma   . Osteopenia   . Elevated cholesterol   . Back pain   . PUD (peptic ulcer disease)   . Macular degeneration   . Typhoid fever   . COPD (chronic obstructive pulmonary disease) ([place])   . Dementia   . Back pain   . HCAP (healthcare-associated pneumonia) 07/20/2014   Past Surgical History  Procedure Laterality Date  . Cholecystectomy    . Abdominal hysterectomy    . Cataract extraction    . Back surgery    . Thyroid surgery     Family History  Problem Relation Age of Onset  . Leukemia Mother   . Pneumonia Father   . Diabetes Brother    Social History  Substance Use Topics  . Smoking status: Never Smoker   . Smokeless tobacco: None  . Alcohol Use: No   OB History    No data available     Review of Systems  Unable to perform ROS: Dementia  Gastrointestinal: Negative for vomiting.  Neurological: Positive for weakness  and headaches. Negative for dizziness, tremors, seizures, syncope, speech difficulty, light-headedness and numbness.  Psychiatric/Behavioral: Positive for confusion, dysphoric mood and decreased concentration.      Allergies  Clonidine derivatives; Ezetimibe-simvastatin; Lipitor; Metformin and related; Morphine and related; and Codeine  Home Medications   Prior to Admission medications   Medication Sig Start Date End Date Taking? Authorizing Provider  acetaminophen (TYLENOL) 650 MG CR tablet Take 1,300 mg by mouth 2 (two) times daily.   Yes Historical Provider, MD  albuterol (PROVENTIL HFA;VENTOLIN HFA) 108 (90 BASE) MCG/ACT inhaler Inhale 2 puffs into the lungs every 6 (six) hours as needed for wheezing or shortness of breath. 08/22/14  Yes Waldemar Dickens, MD  amLODipine (NORVASC) 5 MG tablet Take 5 mg by mouth every morning.   Yes Historical Provider, MD  brimonidine-timolol (COMBIGAN) 0.2-0.5 % ophthalmic solution Place 1 drop into both eyes every 12 (twelve) hours.   Yes Historical Provider, MD  cholecalciferol (VITAMIN D) 1000 UNITS tablet Take 2,000 Units by mouth every morning.    Yes Historical Provider, MD  cloNIDine (CATAPRES) 0.1 MG tablet Take 1 tablet by mouth at bedtime. 04/08/14  Yes Historical Provider, MD  docusate sodium (COLACE) 100 MG capsule Take 100 mg by mouth daily.   Yes Historical Provider, MD  fish oil-omega-3 fatty acids 1000 MG capsule Take 1 g by mouth every  morning.    Yes Historical Provider, MD  gabapentin (NEURONTIN) 250 MG/5ML solution Take 500-1,000 mg by mouth 3 (three) times daily. Take 30ml= 500mg  in the morning and afternoon, and take 60ml or 1000mg  at bedtime   Yes Historical Provider, MD  glipiZIDE (GLUCOTROL) 10 MG tablet Take 10 mg by mouth daily before breakfast.   Yes Historical Provider, MD  HYDROcodone-acetaminophen (NORCO) 10-325 MG per tablet Take 1 tablet by mouth every 8 (eight) hours as needed. 04/12/14  Yes Historical Provider, MD   levothyroxine (SYNTHROID, LEVOTHROID) 100 MCG tablet Take 100 mcg by mouth daily with breakfast.    Yes Historical Provider, MD  LORazepam (ATIVAN) 0.5 MG tablet Take 1 tablet by mouth at bedtime. 04/08/14  Yes Historical Provider, MD  memantine (NAMENDA) 5 MG tablet Take 5 mg by mouth 2 (two) times daily.   Yes Historical Provider, MD  metoprolol tartrate (LOPRESSOR) 25 MG tablet Take 25 mg by mouth 2 (two) times daily.  02/18/13  Yes Historical Provider, MD  Multiple Vitamins-Minerals (PRESERVISION/LUTEIN PO) Take 1 tablet by mouth every morning.    Yes Historical Provider, MD  omeprazole (PRILOSEC) 20 MG capsule Take 40 mg by mouth daily.   Yes Historical Provider, MD  pravastatin (PRAVACHOL) 40 MG tablet Take 40 mg by mouth every evening.    Yes Historical Provider, MD  Propylene Glycol (SYSTANE BALANCE) 0.6 % SOLN Place 1 drop into both eyes daily as needed (dry eyes).   Yes Historical Provider, MD  valsartan (DIOVAN) 320 MG tablet Take 0.5 tablets (160 mg total) by mouth every morning. 04/26/14  Yes Nita Sells, MD  vitamin B-12 (CYANOCOBALAMIN) 1000 MCG tablet Take 2,000 mcg by mouth every morning.    Yes Historical Provider, MD  amoxicillin-clavulanate (AUGMENTIN XR) 1000-62.5 MG 12 hr tablet Take 2 tablets by mouth 2 (two) times daily. 12/25/14   Hoyle Sauer, MD   BP 168/72 mmHg  Pulse 86  Temp(Src) 97.8 F (36.6 C) (Oral)  Resp 16  SpO2 97% Physical Exam  Constitutional: She appears well-developed and well-nourished. No distress.  HENT:  Head: Normocephalic and atraumatic.  Right Ear: External ear normal.  Left Ear: External ear normal.  Nose: Nose normal.  Mouth/Throat: Oropharynx is clear and moist. No oropharyngeal exudate.  Eyes: Conjunctivae and EOM are normal. Pupils are equal, round, and reactive to light. Right eye exhibits no discharge. Left eye exhibits no discharge. No scleral icterus.  Neck: Normal range of motion. Neck supple. No JVD present. No tracheal  deviation present. No thyromegaly present.  Cardiovascular: Normal rate, regular rhythm and intact distal pulses.   Pulmonary/Chest: Effort normal and breath sounds normal. No stridor. No respiratory distress. She has no wheezes. She has no rales. She exhibits no tenderness.  Abdominal: Soft. She exhibits no distension.  Musculoskeletal: Normal range of motion. She exhibits no edema or tenderness.  Lymphadenopathy:    She has no cervical adenopathy.  Neurological: She is alert. She is disoriented. She displays tremor. She displays no atrophy. No cranial nerve deficit or sensory deficit. She exhibits normal muscle tone. She displays no seizure activity. Coordination and gait normal. GCS eye subscore is 4. GCS verbal subscore is 4. GCS motor subscore is 6.  Skin: Skin is warm and dry. No rash noted. She is not diaphoretic. No erythema. No pallor.  Psychiatric: She has a normal mood and affect. Her speech is normal. Judgment and thought content normal. She is slowed. Cognition and memory are impaired.  Nursing note and vitals  reviewed.   ED Course  Procedures (including critical care time) Labs Review Labs Reviewed  CBC WITH DIFFERENTIAL/PLATELET - Abnormal; Notable for the following:    WBC 12.2 (*)    Monocytes Absolute 1.1 (*)    All other components within normal limits  BASIC METABOLIC PANEL - Abnormal; Notable for the following:    Chloride 99 (*)    Glucose, Bld 142 (*)    GFR calc non Af Amer 59 (*)    All other components within normal limits  URINALYSIS, ROUTINE W REFLEX MICROSCOPIC (NOT AT [facility]) - Abnormal; Notable for the following:    Ketones, ur 15 (*)    All other components within normal limits  I-STAT TROPOININ, ED    Imaging Review Dg Chest 2 View  12/24/2014  CLINICAL DATA:  Pt fell and hit the back of her head today EXAM: CHEST - 2 VIEW COMPARISON:  08/22/2014 FINDINGS: Lungs clear. Heart size normal. Atheromatous tortuous aorta. No pneumothorax. Surgical clips at  the thoracic inlet on the right. Mild compression deformity at the thoracolumbar junction as before. No effusion. IMPRESSION: No acute cardiopulmonary disease. Electronically Signed   By: Lucrezia Europe M.D.   On: 12/24/2014 23:10   Dg Pelvis 1-2 Views  12/24/2014  CLINICAL DATA:  Pt fell and hit the back of her head today EXAM: PELVIS - 1-2 VIEW COMPARISON:  MRI 04/11/2014 and previous FINDINGS: There is no evidence of pelvic fracture or diastasis. No pelvic bone lesions are seen. Changes of instrumented PLIF L4-5. Bilateral pelvic phleboliths. IMPRESSION: 1. Negative pelvis. 2. Lumbar fixation hardware as before. Electronically Signed   By: Lucrezia Europe M.D.   On: 12/24/2014 23:11   Ct Head Wo Contrast  12/24/2014  CLINICAL DATA:  Pt lost her balance and fell at home last Saturday evening , hit the back of her head against stove door , no LOC , presents with mild occipital headache , hypertensive , and sinus pressure . EXAM: CT HEAD WITHOUT CONTRAST CT CERVICAL SPINE WITHOUT CONTRAST TECHNIQUE: Multidetector CT imaging of the head and cervical spine was performed following the standard protocol without intravenous contrast. Multiplanar CT image reconstructions of the cervical spine were also generated. COMPARISON:  04/24/2014 FINDINGS: CT HEAD FINDINGS Mucosal thickening in the right maxillary sinus. Fluid level in the left sphenoid sinus. Atherosclerotic and physiologic intracranial calcifications. Diffuse parenchymal atrophy. Patchy areas of hypoattenuation in deep and periventricular white matter bilaterally. Negative for acute intracranial hemorrhage, mass lesion, acute infarction, midline shift, or mass-effect. Acute infarct may be inapparent on noncontrast CT. Ventricles and sulci symmetric. Bone windows demonstrate no focal lesion. CT CERVICAL SPINE FINDINGS Normal alignment. Marked narrowing of the C3-4 interspace. Facet and uncovertebral spurs result in foraminal stenosis left greater than right.  Interbody and posterior element fusion C4-5, C5-6. No prevertebral soft tissue swelling. Negative for fracture. Bilateral carotid bifurcation calcified plaque. Scarring in both lung apices. IMPRESSION: 1.   1.  Negative for bleed or other acute intracranial process. 2. Atrophy and nonspecific white matter changes. 3. Negative for cervical fracture or other acute abnormality. 4. Fusion C4-5 and C5-6 with moderately advanced adjacent level disease C3-4 as above. Electronically Signed   By: Lucrezia Europe M.D.   On: 12/24/2014 22:34   Ct Cervical Spine Wo Contrast  12/24/2014  CLINICAL DATA:  Pt lost her balance and fell at home last Saturday evening , hit the back of her head against stove door , no LOC , presents with mild occipital headache ,  hypertensive , and sinus pressure . EXAM: CT HEAD WITHOUT CONTRAST CT CERVICAL SPINE WITHOUT CONTRAST TECHNIQUE: Multidetector CT imaging of the head and cervical spine was performed following the standard protocol without intravenous contrast. Multiplanar CT image reconstructions of the cervical spine were also generated. COMPARISON:  04/24/2014 FINDINGS: CT HEAD FINDINGS Mucosal thickening in the right maxillary sinus. Fluid level in the left sphenoid sinus. Atherosclerotic and physiologic intracranial calcifications. Diffuse parenchymal atrophy. Patchy areas of hypoattenuation in deep and periventricular white matter bilaterally. Negative for acute intracranial hemorrhage, mass lesion, acute infarction, midline shift, or mass-effect. Acute infarct may be inapparent on noncontrast CT. Ventricles and sulci symmetric. Bone windows demonstrate no focal lesion. CT CERVICAL SPINE FINDINGS Normal alignment. Marked narrowing of the C3-4 interspace. Facet and uncovertebral spurs result in foraminal stenosis left greater than right. Interbody and posterior element fusion C4-5, C5-6. No prevertebral soft tissue swelling. Negative for fracture. Bilateral carotid bifurcation calcified  plaque. Scarring in both lung apices. IMPRESSION: 1.   1.  Negative for bleed or other acute intracranial process. 2. Atrophy and nonspecific white matter changes. 3. Negative for cervical fracture or other acute abnormality. 4. Fusion C4-5 and C5-6 with moderately advanced adjacent level disease C3-4 as above. Electronically Signed   By: Lucrezia Europe M.D.   On: 12/24/2014 22:34   I have personally reviewed and evaluated these images and lab results as part of my medical decision-making.   EKG Interpretation   Date/Time:  Tuesday December 24 2014 23:39:06 EST Ventricular Rate:  89 PR Interval:  224 QRS Duration: 80 QT Interval:  352 QTC Calculation: 428 R Axis:   11 Text Interpretation:  Sinus rhythm Prolonged PR interval No significant  change since last tracing Confirmed by FLOYD MD, Quillian Quince [phone]) on  12/24/2014 11:45:35 PM      MDM   Final diagnoses:  Fall  History of dementia  Acute sinusitis, recurrence not specified, unspecified location    Lori Long is a 81 y.o. female patient with a history of dementia, now experiencing more frequent falls.  Her daughter also endorses more behavioral issues at night, with poor sleeping.  Pt has no obvious injuries externally.  Imaging studies are negative.  No signs of acute infectious process presently.  Pt has signs of microvascular disease on CT head.  No known Hx of CVA.  Based on workup and history of slow cognitive decline pt symptoms appear consistent with advancement of her dementia.  Discussed options for home health and other assistance measures, as pt's daughter is wanting to plan for eventual needs ahead of time.    No other acute concerns at this time.  I referred pt's daughter to a geriatrician because she had some specific concerns regarding possible behavioral medication in the setting of dementia.  Placed request for home health resources including PT, Nursing, SW, and home health aide.  Patient care was discussed  with my attending, Dr. Tyrone Nine.        Hoyle Sauer, MD 12/26/14 [place], DO 12/30/14 1109

## 2014-12-24 NOTE — ED Notes (Signed)
Pt lost her balance and fell at home last Saturday evening , hit the back of her head against stove door , no LOC , presents with mild occipital headache , hypertensive , and sinus pressure .

## 2014-12-25 DIAGNOSIS — S0990XA Unspecified injury of head, initial encounter: Secondary | ICD-10-CM | POA: Diagnosis not present

## 2014-12-25 LAB — URINALYSIS, ROUTINE W REFLEX MICROSCOPIC
Bilirubin Urine: NEGATIVE
GLUCOSE, UA: NEGATIVE mg/dL
Hgb urine dipstick: NEGATIVE
Ketones, ur: 15 mg/dL — AB
LEUKOCYTES UA: NEGATIVE
NITRITE: NEGATIVE
PH: 5.5 (ref 5.0–8.0)
PROTEIN: NEGATIVE mg/dL
Specific Gravity, Urine: 1.01 (ref 1.005–1.030)

## 2014-12-25 MED ORDER — AMOXICILLIN-POT CLAVULANATE ER 1000-62.5 MG PO TB12: 2.0000 | ORAL_TABLET | Freq: Two times a day (BID) | ORAL | Status: DC

## 2014-12-25 NOTE — Discharge Instructions (Signed)

## 2015-03-19 ENCOUNTER — Encounter (HOSPITAL_COMMUNITY): Payer: Self-pay

## 2015-03-19 ENCOUNTER — Inpatient Hospital Stay (HOSPITAL_COMMUNITY)
Admission: EM | Admit: 2015-03-19 | Discharge: 2015-03-25 | DRG: 803 | Disposition: A | Discharge status: Nursing Home (Includes ICF) | Payer: Medicare Other | Attending: Internal Medicine | Admitting: Internal Medicine

## 2015-03-19 ENCOUNTER — Emergency Department (HOSPITAL_COMMUNITY): Payer: Medicare Other

## 2015-03-19 DIAGNOSIS — T368X5A Adverse effect of other systemic antibiotics, initial encounter: Secondary | ICD-10-CM | POA: Diagnosis not present

## 2015-03-19 DIAGNOSIS — H9190 Unspecified hearing loss, unspecified ear: Secondary | ICD-10-CM | POA: Diagnosis present

## 2015-03-19 DIAGNOSIS — J449 Chronic obstructive pulmonary disease, unspecified: Secondary | ICD-10-CM | POA: Diagnosis present

## 2015-03-19 DIAGNOSIS — N141 Nephropathy induced by other drugs, medicaments and biological substances: Secondary | ICD-10-CM | POA: Diagnosis not present

## 2015-03-19 DIAGNOSIS — Y92230 Patient room in hospital as the place of occurrence of the external cause: Secondary | ICD-10-CM | POA: Diagnosis not present

## 2015-03-19 DIAGNOSIS — E890 Postprocedural hypothyroidism: Secondary | ICD-10-CM | POA: Diagnosis present

## 2015-03-19 DIAGNOSIS — L040 Acute lymphadenitis of face, head and neck: Secondary | ICD-10-CM | POA: Diagnosis present

## 2015-03-19 DIAGNOSIS — B999 Unspecified infectious disease: Secondary | ICD-10-CM | POA: Diagnosis not present

## 2015-03-19 DIAGNOSIS — I2510 Atherosclerotic heart disease of native coronary artery without angina pectoris: Secondary | ICD-10-CM | POA: Diagnosis present

## 2015-03-19 DIAGNOSIS — Z833 Family history of diabetes mellitus: Secondary | ICD-10-CM | POA: Diagnosis not present

## 2015-03-19 DIAGNOSIS — L049 Acute lymphadenitis, unspecified: Secondary | ICD-10-CM | POA: Diagnosis present

## 2015-03-19 DIAGNOSIS — E7800 Pure hypercholesterolemia, unspecified: Secondary | ICD-10-CM | POA: Diagnosis present

## 2015-03-19 DIAGNOSIS — E119 Type 2 diabetes mellitus without complications: Secondary | ICD-10-CM | POA: Diagnosis present

## 2015-03-19 DIAGNOSIS — Z885 Allergy status to narcotic agent status: Secondary | ICD-10-CM

## 2015-03-19 DIAGNOSIS — F0390 Unspecified dementia without behavioral disturbance: Secondary | ICD-10-CM | POA: Diagnosis present

## 2015-03-19 DIAGNOSIS — Z66 Do not resuscitate: Secondary | ICD-10-CM | POA: Diagnosis present

## 2015-03-19 DIAGNOSIS — Z806 Family history of leukemia: Secondary | ICD-10-CM | POA: Diagnosis not present

## 2015-03-19 DIAGNOSIS — Z886 Allergy status to analgesic agent status: Secondary | ICD-10-CM | POA: Diagnosis not present

## 2015-03-19 DIAGNOSIS — L03211 Cellulitis of face: Secondary | ICD-10-CM | POA: Diagnosis present

## 2015-03-19 DIAGNOSIS — I10 Essential (primary) hypertension: Secondary | ICD-10-CM | POA: Diagnosis not present

## 2015-03-19 DIAGNOSIS — L089 Local infection of the skin and subcutaneous tissue, unspecified: Secondary | ICD-10-CM | POA: Diagnosis present

## 2015-03-19 DIAGNOSIS — C760 Malignant neoplasm of head, face and neck: Secondary | ICD-10-CM | POA: Diagnosis not present

## 2015-03-19 DIAGNOSIS — G4730 Sleep apnea, unspecified: Secondary | ICD-10-CM | POA: Diagnosis present

## 2015-03-19 DIAGNOSIS — K122 Cellulitis and abscess of mouth: Secondary | ICD-10-CM | POA: Diagnosis present

## 2015-03-19 DIAGNOSIS — Z888 Allergy status to other drugs, medicaments and biological substances status: Secondary | ICD-10-CM | POA: Diagnosis not present

## 2015-03-19 DIAGNOSIS — C7989 Secondary malignant neoplasm of other specified sites: Secondary | ICD-10-CM | POA: Diagnosis present

## 2015-03-19 DIAGNOSIS — Z7984 Long term (current) use of oral hypoglycemic drugs: Secondary | ICD-10-CM | POA: Diagnosis not present

## 2015-03-19 DIAGNOSIS — N179 Acute kidney failure, unspecified: Secondary | ICD-10-CM | POA: Diagnosis not present

## 2015-03-19 LAB — CBC WITH DIFFERENTIAL/PLATELET
BASOS ABS: 0 10*3/uL (ref 0.0–0.1)
Basophils Relative: 0 %
EOS ABS: 0.7 10*3/uL (ref 0.0–0.7)
EOS PCT: 6 %
HCT: 35.5 % — ABNORMAL LOW (ref 36.0–46.0)
Hemoglobin: 11.5 g/dL — ABNORMAL LOW (ref 12.0–15.0)
LYMPHS ABS: 2.3 10*3/uL (ref 0.7–4.0)
LYMPHS PCT: 19 %
MCH: 30 pg (ref 26.0–34.0)
MCHC: 32.4 g/dL (ref 30.0–36.0)
MCV: 92.7 fL (ref 78.0–100.0)
MONO ABS: 1.4 10*3/uL — AB (ref 0.1–1.0)
Monocytes Relative: 11 %
Neutro Abs: 7.8 10*3/uL — ABNORMAL HIGH (ref 1.7–7.7)
Neutrophils Relative %: 64 %
PLATELETS: 255 10*3/uL (ref 150–400)
RBC: 3.83 MIL/uL — ABNORMAL LOW (ref 3.87–5.11)
RDW: 13.2 % (ref 11.5–15.5)
WBC: 12.1 10*3/uL — AB (ref 4.0–10.5)

## 2015-03-19 LAB — BASIC METABOLIC PANEL
Anion gap: 10 (ref 5–15)
BUN: 13 mg/dL (ref 6–20)
CALCIUM: 8.5 mg/dL — AB (ref 8.9–10.3)
CO2: 25 mmol/L (ref 22–32)
Chloride: 96 mmol/L — ABNORMAL LOW (ref 101–111)
Creatinine, Ser: 0.9 mg/dL (ref 0.44–1.00)
GFR calc Af Amer: >60 mL/min (ref >60)
GFR, EST NON AFRICAN AMERICAN: 55 mL/min — AB (ref >60)
GLUCOSE: 169 mg/dL — AB (ref 65–99)
Potassium: 4.2 mmol/L (ref 3.5–5.1)
SODIUM: 131 mmol/L — AB (ref 135–145)

## 2015-03-19 LAB — I-STAT CHEM 8, ED
BUN: 12 mg/dL (ref 6–20)
CALCIUM ION: 1.02 mmol/L — AB (ref 1.13–1.30)
CHLORIDE: 94 mmol/L — AB (ref 101–111)
CREATININE: 0.8 mg/dL (ref 0.44–1.00)
GLUCOSE: 168 mg/dL — AB (ref 65–99)
HCT: 38 % (ref 36.0–46.0)
HEMOGLOBIN: 12.9 g/dL (ref 12.0–15.0)
POTASSIUM: 4 mmol/L (ref 3.5–5.1)
SODIUM: 131 mmol/L — AB (ref 135–145)
TCO2: 24 mmol/L (ref 0–100)

## 2015-03-19 LAB — LACTIC ACID, PLASMA: Lactic Acid, Venous: 0.8 mmol/L (ref 0.5–2.0)

## 2015-03-19 LAB — GLUCOSE, CAPILLARY: Glucose-Capillary: 154 mg/dL — ABNORMAL HIGH (ref 65–99)

## 2015-03-19 LAB — SEDIMENTATION RATE: SED RATE: 52 mm/h — AB (ref 0–22)

## 2015-03-19 MED ORDER — OXYCODONE HCL 5 MG PO TABS
5.0000 mg | ORAL_TABLET | ORAL | Status: DC | PRN
  Administered 2015-03-19 – 2015-03-22 (×7): 5 mg via ORAL
  Filled 2015-03-19 (×8): qty 1

## 2015-03-19 MED ORDER — METOPROLOL TARTRATE 25 MG PO TABS
25.0000 mg | ORAL_TABLET | Freq: Two times a day (BID) | ORAL | Status: DC
Start: 2015-03-19 — End: 2015-03-25
  Administered 2015-03-19 – 2015-03-25 (×12): 25 mg via ORAL
  Filled 2015-03-19 (×12): qty 1

## 2015-03-19 MED ORDER — PIPERACILLIN-TAZOBACTAM 3.375 G IVPB 30 MIN
3.3750 g | Freq: Once | INTRAVENOUS | Status: AC
  Administered 2015-03-19: 3.375 g via INTRAVENOUS
  Filled 2015-03-19 (×2): qty 50

## 2015-03-19 MED ORDER — SODIUM CHLORIDE 0.9 % IV BOLUS (SEPSIS)
1000.0000 mL | Freq: Once | INTRAVENOUS | Status: AC
  Administered 2015-03-19: 1000 mL via INTRAVENOUS

## 2015-03-19 MED ORDER — ONDANSETRON HCL 4 MG PO TABS: 4.0000 mg | ORAL_TABLET | Freq: Four times a day (QID) | ORAL | Status: DC | PRN

## 2015-03-19 MED ORDER — DOCUSATE SODIUM 100 MG PO CAPS
200.0000 mg | ORAL_CAPSULE | Freq: Two times a day (BID) | ORAL | Status: DC
  Administered 2015-03-19 – 2015-03-25 (×8): 200 mg via ORAL
  Filled 2015-03-19 (×10): qty 2

## 2015-03-19 MED ORDER — GABAPENTIN 300 MG PO CAPS
600.0000 mg | ORAL_CAPSULE | Freq: Three times a day (TID) | ORAL | Status: DC
  Administered 2015-03-19 – 2015-03-22 (×8): 600 mg via ORAL
  Filled 2015-03-19 (×8): qty 2

## 2015-03-19 MED ORDER — SODIUM CHLORIDE 0.9% FLUSH
3.0000 mL | Freq: Two times a day (BID) | INTRAVENOUS | Status: DC
  Administered 2015-03-20 – 2015-03-23 (×5): 3 mL via INTRAVENOUS

## 2015-03-19 MED ORDER — TIMOLOL MALEATE 0.5 % OP SOLN
1.0000 [drp] | Freq: Every day | OPHTHALMIC | Status: DC
  Administered 2015-03-20 – 2015-03-25 (×6): 1 [drp] via OPHTHALMIC
  Filled 2015-03-19 (×2): qty 5

## 2015-03-19 MED ORDER — CETYLPYRIDINIUM CHLORIDE 0.05 % MT LIQD
7.0000 mL | Freq: Two times a day (BID) | OROMUCOSAL | Status: DC
  Administered 2015-03-19 – 2015-03-25 (×6): 7 mL via OROMUCOSAL

## 2015-03-19 MED ORDER — INSULIN ASPART 100 UNIT/ML ~~LOC~~ SOLN
0.0000 [IU] | Freq: Three times a day (TID) | SUBCUTANEOUS | Status: DC
  Administered 2015-03-19 – 2015-03-21 (×3): 3 [IU] via SUBCUTANEOUS
  Administered 2015-03-22 – 2015-03-25 (×6): 2 [IU] via SUBCUTANEOUS

## 2015-03-19 MED ORDER — BRIMONIDINE TARTRATE 0.2 % OP SOLN
1.0000 [drp] | Freq: Every day | OPHTHALMIC | Status: DC
  Administered 2015-03-20 – 2015-03-25 (×6): 1 [drp] via OPHTHALMIC
  Filled 2015-03-19 (×2): qty 5

## 2015-03-19 MED ORDER — DOCUSATE SODIUM 100 MG PO CAPS: 100.0000 mg | ORAL_CAPSULE | Freq: Two times a day (BID) | ORAL | Status: DC

## 2015-03-19 MED ORDER — CLONAZEPAM 0.5 MG PO TABS
0.5000 mg | ORAL_TABLET | Freq: Every day | ORAL | Status: DC
  Administered 2015-03-19 – 2015-03-24 (×6): 0.5 mg via ORAL
  Filled 2015-03-19 (×6): qty 1

## 2015-03-19 MED ORDER — SODIUM CHLORIDE 0.9 % IV SOLN
INTRAVENOUS | Status: AC
  Administered 2015-03-19 – 2015-03-20 (×2): via INTRAVENOUS

## 2015-03-19 MED ORDER — PIPERACILLIN-TAZOBACTAM 3.375 G IVPB
3.3750 g | Freq: Three times a day (TID) | INTRAVENOUS | Status: DC
  Administered 2015-03-20 – 2015-03-22 (×7): 3.375 g via INTRAVENOUS
  Filled 2015-03-19 (×13): qty 50

## 2015-03-19 MED ORDER — LEVOTHYROXINE SODIUM 50 MCG PO TABS
50.0000 ug | ORAL_TABLET | Freq: Every day | ORAL | Status: DC
  Administered 2015-03-20 – 2015-03-25 (×6): 50 ug via ORAL
  Filled 2015-03-19 (×6): qty 1

## 2015-03-19 MED ORDER — CLONIDINE HCL 0.1 MG PO TABS
0.1000 mg | ORAL_TABLET | Freq: Every day | ORAL | Status: DC
  Administered 2015-03-19 – 2015-03-24 (×6): 0.1 mg via ORAL
  Filled 2015-03-19 (×6): qty 1

## 2015-03-19 MED ORDER — ACETAMINOPHEN 325 MG PO TABS
650.0000 mg | ORAL_TABLET | Freq: Four times a day (QID) | ORAL | Status: DC | PRN
  Administered 2015-03-19 – 2015-03-23 (×2): 650 mg via ORAL
  Filled 2015-03-19 (×2): qty 2

## 2015-03-19 MED ORDER — BRIMONIDINE TARTRATE-TIMOLOL 0.2-0.5 % OP SOLN: 1.0000 [drp] | Freq: Every day | OPHTHALMIC | Status: DC

## 2015-03-19 MED ORDER — CHLORHEXIDINE GLUCONATE 0.12 % MT SOLN
15.0000 mL | Freq: Two times a day (BID) | OROMUCOSAL | Status: DC
  Administered 2015-03-19 – 2015-03-25 (×11): 15 mL via OROMUCOSAL
  Filled 2015-03-19 (×11): qty 15

## 2015-03-19 MED ORDER — CLINDAMYCIN PHOSPHATE 900 MG/50ML IV SOLN
900.0000 mg | Freq: Once | INTRAVENOUS | Status: AC
  Administered 2015-03-19: 900 mg via INTRAVENOUS
  Filled 2015-03-19: qty 50

## 2015-03-19 MED ORDER — PRAVASTATIN SODIUM 40 MG PO TABS
40.0000 mg | ORAL_TABLET | Freq: Every evening | ORAL | Status: DC
  Administered 2015-03-19 – 2015-03-24 (×4): 40 mg via ORAL
  Filled 2015-03-19 (×5): qty 1

## 2015-03-19 MED ORDER — IOHEXOL 300 MG/ML  SOLN
75.0000 mL | Freq: Once | INTRAMUSCULAR | Status: AC | PRN
  Administered 2015-03-19: 75 mL via INTRAVENOUS

## 2015-03-19 MED ORDER — MUPIROCIN 2 % EX OINT: 1.0000 "application " | TOPICAL_OINTMENT | Freq: Two times a day (BID) | CUTANEOUS | Status: DC

## 2015-03-19 MED ORDER — VANCOMYCIN HCL 500 MG IV SOLR
500.0000 mg | Freq: Two times a day (BID) | INTRAVENOUS | Status: DC
  Administered 2015-03-20 – 2015-03-23 (×7): 500 mg via INTRAVENOUS
  Filled 2015-03-19 (×10): qty 500

## 2015-03-19 MED ORDER — VANCOMYCIN HCL IN DEXTROSE 1-5 GM/200ML-% IV SOLN
1000.0000 mg | Freq: Once | INTRAVENOUS | Status: AC
  Administered 2015-03-19: 1000 mg via INTRAVENOUS
  Filled 2015-03-19: qty 200

## 2015-03-19 MED ORDER — CHLORHEXIDINE GLUCONATE CLOTH 2 % EX PADS: 6.0000 | MEDICATED_PAD | Freq: Every day | CUTANEOUS | Status: DC

## 2015-03-19 MED ORDER — ONDANSETRON HCL 4 MG/2ML IJ SOLN
4.0000 mg | Freq: Four times a day (QID) | INTRAMUSCULAR | Status: DC | PRN
  Administered 2015-03-21: 4 mg via INTRAVENOUS
  Filled 2015-03-19: qty 2

## 2015-03-19 MED ORDER — ACETAMINOPHEN 650 MG RE SUPP: 650.0000 mg | Freq: Four times a day (QID) | RECTAL | Status: DC | PRN

## 2015-03-19 MED ORDER — MEMANTINE HCL 5 MG PO TABS
5.0000 mg | ORAL_TABLET | Freq: Two times a day (BID) | ORAL | Status: DC
  Administered 2015-03-19 – 2015-03-25 (×12): 5 mg via ORAL
  Filled 2015-03-19 (×14): qty 1

## 2015-03-19 MED ORDER — HYDROMORPHONE HCL 1 MG/ML IJ SOLN
0.5000 mg | INTRAMUSCULAR | Status: DC | PRN
  Administered 2015-03-21 – 2015-03-24 (×4): 0.5 mg via INTRAVENOUS
  Filled 2015-03-19 (×3): qty 1

## 2015-03-19 MED ORDER — ENOXAPARIN SODIUM 40 MG/0.4ML ~~LOC~~ SOLN
40.0000 mg | SUBCUTANEOUS | Status: DC
  Administered 2015-03-19 – 2015-03-22 (×4): 40 mg via SUBCUTANEOUS
  Filled 2015-03-19 (×4): qty 0.4

## 2015-03-19 NOTE — Progress Notes (Signed)
Pharmacy Antibiotic Note  Lori Long is a 82 y.o. female [facility] resident sent from the dentist on 03/19/2015 with neck swelling, adenitis, cellulitis. Pharmacy has been consulted for Vancomycin and Zosyn dosing. Vancomycin and Clindamycin ordered in the ED. CrCl ~43ml/min.  Plan: Vancomycin 500mg  IV every q12h hours.  Goal trough 15-20 mcg/mL. Zosyn 3.375g IV q8h (4 hour infusion). Measure Vanc trough at steady state. Follow up renal fxn, culture results, and clinical course.     Temp (24hrs), Avg:98 F (36.7 C), Min:97.8 F (36.6 C), Max:98.1 F (36.7 C)   Recent Labs Lab 03/19/15 1113 03/19/15 1122  WBC 12.1*  --   CREATININE 0.90 0.80    CrCl cannot be calculated (Unknown ideal weight.).    Allergies  Allergen Reactions  . Clonidine Derivatives     Local reaction to patch.  . Ezetimibe-Simvastatin Other (See Comments)    Leg pain from Vytorin  . Lipitor [Atorvastatin Calcium] Other (See Comments)    Couldn't tolerate  . Metformin And Related Diarrhea  . Morphine And Related Other (See Comments)    hallucination  . Codeine Rash    Antimicrobials this admission: 2/8 >> Vanc >>  2/8 >> Zosyn >>  Dose adjustments this admission:  Microbiology results: BCx: UCx: Sputum: MRSA PCR:  Thank you for allowing pharmacy to be a part of this patient's care.  Romeo Rabon, PharmD, pager [phone]. 03/19/2015,4:05 PM.

## 2015-03-19 NOTE — ED Notes (Signed)
Patient is a resident of [facility]. Patient was taken to the dentist and was told to come to the ED for an I & D Patient ahs swelling to the right face. Pain increases when she swallows. Patient also has a loose tooth in the front.

## 2015-03-19 NOTE — ED Provider Notes (Signed)
CSN: [id number]     Arrival date & time 03/19/15  [id number] History   First MD Initiated Contact with Patient 03/19/15 1025     Chief Complaint  Patient presents with  . Dental Pain  . Oral Swelling     (Consider location/radiation/quality/duration/timing/severity/associated sxs/prior Treatment) Patient is a 82 y.o. female presenting with neck injury and general illness. The history is provided by the patient.  Neck Injury Pertinent negatives include no chest pain, no headaches and no shortness of breath.  Illness Severity:  Moderate Onset quality:  Sudden Duration:  2 days Timing:  Constant Progression:  Worsening Chronicity:  New Associated symptoms: no chest pain, no congestion, no fever, no headaches, no myalgias, no nausea, no rhinorrhea, no shortness of breath, no vomiting and no wheezing     82 yo F with a chief complaint of right-sided neck swelling. The started couple days ago and got significantly worse. Patient was seen at the dentist today for a loose tooth on the other side of her mouth was told she had come to the emergency department likely for an incision and drainage. Patient denies fevers chills nausea. Denies any intraoral pain.  Past Medical History  Diagnosis Date  . Coronary artery disease   . Hypertension   . Arthritis   . Diabetes mellitus   . Renal disorder   . Sleep apnea   . Thyroid disease   . Glaucoma   . Osteopenia   . Elevated cholesterol   . Back pain   . PUD (peptic ulcer disease)   . Macular degeneration   . Typhoid fever   . COPD (chronic obstructive pulmonary disease) ([place])   . Dementia   . Back pain   . HCAP (healthcare-associated pneumonia) 07/20/2014   Past Surgical History  Procedure Laterality Date  . Cholecystectomy    . Abdominal hysterectomy    . Cataract extraction    . Back surgery    . Thyroid surgery     Family History  Problem Relation Age of Onset  . Leukemia Mother   . Pneumonia Father   . Diabetes Brother     Social History  Substance Use Topics  . Smoking status: Never Smoker   . Smokeless tobacco: None  . Alcohol Use: No   OB History    No data available     Review of Systems  Constitutional: Negative for fever and chills.  HENT: Negative for congestion and rhinorrhea.        Neck swelling  Eyes: Negative for redness and visual disturbance.  Respiratory: Negative for shortness of breath and wheezing.   Cardiovascular: Negative for chest pain and palpitations.  Gastrointestinal: Negative for nausea and vomiting.  Genitourinary: Negative for dysuria and urgency.  Musculoskeletal: Negative for myalgias and arthralgias.  Skin: Negative for pallor and wound.  Neurological: Negative for dizziness and headaches.      Allergies  Clonidine derivatives; Ezetimibe-simvastatin; Lipitor; Metformin and related; Morphine and related; and Codeine  Home Medications   Prior to Admission medications   Medication Sig Start Date End Date Taking? Authorizing Provider  acetaminophen (TYLENOL) 650 MG CR tablet Take 1,300 mg by mouth 2 (two) times daily as needed for pain.    Yes Historical Provider, MD  albuterol (PROVENTIL HFA;VENTOLIN HFA) 108 (90 BASE) MCG/ACT inhaler Inhale 2 puffs into the lungs every 6 (six) hours as needed for wheezing or shortness of breath. 08/22/14  Yes Waldemar Dickens, MD  amLODipine (NORVASC) 5 MG tablet Take 5 mg  by mouth every morning.   Yes Historical Provider, MD  amoxicillin-clavulanate (AUGMENTIN) 875-125 MG tablet Take 1 tablet by mouth 2 (two) times daily. 03/17/15 03/31/15 Yes Historical Provider, MD  brimonidine-timolol (COMBIGAN) 0.2-0.5 % ophthalmic solution Place 1 drop into both eyes daily.    Yes Historical Provider, MD  cholecalciferol (VITAMIN D) 1000 UNITS tablet Take 1,000 Units by mouth daily.    Yes Historical Provider, MD  clonazePAM (KLONOPIN) 0.5 MG tablet Take 0.5 mg by mouth at bedtime.   Yes Historical Provider, MD  cloNIDine (CATAPRES) 0.1 MG  tablet Take 1 tablet by mouth at bedtime. 04/08/14  Yes Historical Provider, MD  docusate sodium (COLACE) 100 MG capsule Take 200 mg by mouth 2 (two) times daily.    Yes Historical Provider, MD  ferrous fumarate (HEMOCYTE - 106 MG FE) 325 (106 Fe) MG TABS tablet Take 325 tablets by mouth every other day.   Yes Historical Provider, MD  fish oil-omega-3 fatty acids 1000 MG capsule Take 1 g by mouth every morning.    Yes Historical Provider, MD  gabapentin (NEURONTIN) 600 MG tablet Take 600 mg by mouth 3 (three) times daily.   Yes Historical Provider, MD  glipiZIDE (GLUCOTROL XL) 10 MG 24 hr tablet Take 10 mg by mouth daily with breakfast.   Yes Historical Provider, MD  guaiFENesin (ROBITUSSIN) 100 MG/5ML liquid Take 300 mg by mouth 3 (three) times daily. For 10 days   Yes Historical Provider, MD  levothyroxine (SYNTHROID, LEVOTHROID) 50 MCG tablet Take 50 mcg by mouth daily before breakfast.   Yes Historical Provider, MD  memantine (NAMENDA) 5 MG tablet Take 5 mg by mouth 2 (two) times daily.   Yes Historical Provider, MD  metoprolol tartrate (LOPRESSOR) 25 MG tablet Take 25 mg by mouth 2 (two) times daily.  02/18/13  Yes Historical Provider, MD  Multiple Vitamins-Minerals (PRESERVISION/LUTEIN PO) Take 2 tablets by mouth every morning.    Yes Historical Provider, MD  omeprazole (PRILOSEC) 20 MG capsule Take 40 mg by mouth daily.   Yes Historical Provider, MD  pravastatin (PRAVACHOL) 40 MG tablet Take 40 mg by mouth every evening.    Yes Historical Provider, MD  Propylene Glycol (SYSTANE BALANCE) 0.6 % SOLN Place 1 drop into both eyes daily.    Yes Historical Provider, MD  traMADol (ULTRAM) 50 MG tablet Take 100 mg by mouth 3 (three) times daily.   Yes Historical Provider, MD  valsartan (DIOVAN) 320 MG tablet Take 0.5 tablets (160 mg total) by mouth every morning. Patient taking differently: Take 320 mg by mouth daily.  04/26/14  Yes Nita Sells, MD   BP 141/63 mmHg  Pulse 69  Temp(Src) 98.1 F  (36.7 C) (Oral)  Resp 14  SpO2 94% Physical Exam  Constitutional: She is oriented to person, place, and time. She appears well-developed and well-nourished. No distress.  HENT:  Head: Normocephalic and atraumatic.  Marked swelling and fluctuance just below the angle of the jaw on the right side. No noted intraoral edema or tooth pain. No sublingual swelling.  Eyes: EOM are normal. Pupils are equal, round, and reactive to light.  Neck: Normal range of motion. Neck supple.  Cardiovascular: Normal rate and regular rhythm.  Exam reveals no gallop and no friction rub.   No murmur heard. Pulmonary/Chest: Effort normal. She has no wheezes. She has no rales.  Abdominal: Soft. She exhibits no distension. There is no tenderness. There is no rebound and no guarding.  Musculoskeletal: She exhibits no edema  or tenderness.  Neurological: She is alert and oriented to person, place, and time.  Skin: Skin is warm and dry. She is not diaphoretic.  Psychiatric: She has a normal mood and affect. Her behavior is normal.  Nursing note and vitals reviewed.   ED Course  Procedures (including critical care time) Labs Review Labs Reviewed  CBC WITH DIFFERENTIAL/PLATELET - Abnormal; Notable for the following:    WBC 12.1 (*)    RBC 3.83 (*)    Hemoglobin 11.5 (*)    HCT 35.5 (*)    Neutro Abs 7.8 (*)    Monocytes Absolute 1.4 (*)    All other components within normal limits  BASIC METABOLIC PANEL - Abnormal; Notable for the following:    Sodium 131 (*)    Chloride 96 (*)    Glucose, Bld 169 (*)    Calcium 8.5 (*)    GFR calc non Af Amer 55 (*)    All other components within normal limits  SEDIMENTATION RATE - Abnormal; Notable for the following:    Sed Rate 52 (*)    All other components within normal limits  I-STAT CHEM 8, ED - Abnormal; Notable for the following:    Sodium 131 (*)    Chloride 94 (*)    Glucose, Bld 168 (*)    Calcium, Ion 1.02 (*)    All other components within normal limits     Imaging Review Ct Soft Tissue Neck W Contrast  03/19/2015  CLINICAL DATA:  Right facial swelling.  Leukocytosis. EXAM: CT NECK WITH CONTRAST TECHNIQUE: Multidetector CT imaging of the neck was performed using the standard protocol following the bolus administration of intravenous contrast. CONTRAST:  17mL OMNIPAQUE IOHEXOL 300 MG/ML  SOLN COMPARISON:  None. FINDINGS: Pharynx and larynx: Negative for pharyngeal mass or edema. Airway is intact. Larynx is normal. Salivary glands: Parotid gland normal bilaterally. Left submandibular gland normal. Right submandibular gland appears atrophic. Lateral to the right submandibular gland are enlarged lymph nodes with fluid-filled center. These measure 13 mm and 14 mm. There is also a 14 mm solid lymph node in this area. There is surrounding soft tissue edema and thickening of the platysmas muscle suggesting acute infection. No calculus identified. Thyroid: Left thyroid lobe normal. Surgical resection right thyroid lobe without mass lesion. Lymph nodes: Suppurative adenopathy right submandibular region measuring 13 mm and 14 mm. Solid submandibular node on the right 14 mm. These have surrounding soft tissue edema and thickening of the platysmas muscle. There is edema in the subcutaneous fat. Findings suggest acute infection and separate adenopathy. No other adenopathy. Vascular: Extensive atherosclerotic calcification in the aortic arch proximal great vessels and carotid bifurcation bilaterally. Internal carotid artery patent bilaterally. Jugular vein patent bilaterally. Left jugular vein dominant. Limited intracranial: Generalized atrophy. No acute intracranial abnormality. Visualized orbits: Negative Mastoids and visualized paranasal sinuses: Mild mucosal edema in the right maxillary sinus. No air-fluid level. Skeleton: No evidence of dental infection. 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 Marked degeneration at C3-4 with spurring. Upper chest: Lung apices are clear.  IMPRESSION: Enlarged liquefied submandibular nodes on the right. In addition, there is a solid enlarged lymph node in this area. There is surrounding soft tissue edema suggesting acute infection and suppurative adenitis. This is immediately adjacent to the submandibular gland which is atrophic. No evidence of acute dental infection. These results were called by telephone at the time of interpretation on 03/19/2015 at 12:53 pm to Dr. Deno Etienne , who verbally acknowledged these results. Electronically Signed  By: Franchot Gallo M.D.   On: 03/19/2015 12:53   I have personally reviewed and evaluated these images and lab results as part of my medical decision-making.   EKG Interpretation None      MDM   Final diagnoses:  Suppurative lymphadenitis  Neck infection    82 yo F with right-sided neck swelling. His pre-significant. Patient has no trismus. Swelling extends part way down the neck. Is having no chest pain shortness of breath. Will obtain a CT scan of the neck with contrast. Give clindamycin. Found to have suppurative lymphadenitis.   Case was discussed with Dr. Janace Hoard, ENT. He would like a 24-hour observation. With IV antibiotics and if it worsens or does not improve he will go forward with drainage.    The patients results and plan were reviewed and discussed.   Any x-rays performed were independently reviewed by myself.   Differential diagnosis were considered with the presenting HPI.  Medications  clindamycin (CLEOCIN) IVPB 900 mg (0 mg Intravenous Stopped 03/19/15 1142)  sodium chloride 0.9 % bolus 1,000 mL (0 mLs Intravenous Stopped 03/19/15 1230)  iohexol (OMNIPAQUE) 300 MG/ML solution 75 mL (75 mLs Intravenous Contrast Given 03/19/15 1220)    Filed Vitals:   03/19/15 1002 03/19/15 1217 03/19/15 1500  BP: 104/58 114/55 141/63  Pulse: 70 68 69  Temp: 98.1 F (36.7 C)    TempSrc: Oral    Resp: 18 16 14   SpO2: 93% 94% 94%    Final diagnoses:  Suppurative lymphadenitis  Neck  infection    Admission/ observation were discussed with the admitting physician, patient and/or family and they are comfortable with the plan.    Deno Etienne, DO 03/19/15 1542

## 2015-03-19 NOTE — H&P (Signed)
[facility] History and Physical  [name] SERVICE GYJ:[id number] DOB: October 05, 1923 DOA: 03/19/2015   PCP: Marjorie Smolder, MD  Specialists: Unknown  Chief Complaint: Pain in the right neck area  HPI: Lori Long is a 82 y.o. female with a past medical history of dementia, diabetes on oral medications, hypertension, who lives in a retirement center. Patient went to her dentist for a loose tooth, but was sent over to the ED due to significant swelling to the right face and neck area. Patient is hard of hearing and has dementia and is unable to provide any history. Her daughters are at the bedside. They have limited information. So history is very limited. Apparently the swelling in the right neck area started this past Monday. Got worse progressively. There hasn't been any difficulty with breathing. There has been some difficulty with swallowing. In the emergency department, patient was found to have elevated WBC. CT scan of the neck showed evidence for suppurative lymphadenitis. No abscess has been noted.  Home Medications: Prior to Admission medications   Medication Sig Start Date End Date Taking? Authorizing Provider  acetaminophen (TYLENOL) 650 MG CR tablet Take 1,300 mg by mouth 2 (two) times daily as needed for pain.    Yes Historical Provider, MD  albuterol (PROVENTIL HFA;VENTOLIN HFA) 108 (90 BASE) MCG/ACT inhaler Inhale 2 puffs into the lungs every 6 (six) hours as needed for wheezing or shortness of breath. 08/22/14  Yes Waldemar Dickens, MD  amLODipine (NORVASC) 5 MG tablet Take 5 mg by mouth every morning.   Yes Historical Provider, MD  amoxicillin-clavulanate (AUGMENTIN) 875-125 MG tablet Take 1 tablet by mouth 2 (two) times daily. 03/17/15 03/31/15 Yes Historical Provider, MD  brimonidine-timolol (COMBIGAN) 0.2-0.5 % ophthalmic solution Place 1 drop into both eyes daily.    Yes Historical Provider, MD  cholecalciferol (VITAMIN D) 1000 UNITS tablet Take 1,000 Units by mouth  daily.    Yes Historical Provider, MD  clonazePAM (KLONOPIN) 0.5 MG tablet Take 0.5 mg by mouth at bedtime.   Yes Historical Provider, MD  cloNIDine (CATAPRES) 0.1 MG tablet Take 1 tablet by mouth at bedtime. 04/08/14  Yes Historical Provider, MD  docusate sodium (COLACE) 100 MG capsule Take 200 mg by mouth 2 (two) times daily.    Yes Historical Provider, MD  ferrous fumarate (HEMOCYTE - 106 MG FE) 325 (106 Fe) MG TABS tablet Take 325 tablets by mouth every other day.   Yes Historical Provider, MD  fish oil-omega-3 fatty acids 1000 MG capsule Take 1 g by mouth every morning.    Yes Historical Provider, MD  gabapentin (NEURONTIN) 600 MG tablet Take 600 mg by mouth 3 (three) times daily.   Yes Historical Provider, MD  glipiZIDE (GLUCOTROL XL) 10 MG 24 hr tablet Take 10 mg by mouth daily with breakfast.   Yes Historical Provider, MD  guaiFENesin (ROBITUSSIN) 100 MG/5ML liquid Take 300 mg by mouth 3 (three) times daily. For 10 days   Yes Historical Provider, MD  levothyroxine (SYNTHROID, LEVOTHROID) 50 MCG tablet Take 50 mcg by mouth daily before breakfast.   Yes Historical Provider, MD  memantine (NAMENDA) 5 MG tablet Take 5 mg by mouth 2 (two) times daily.   Yes Historical Provider, MD  metoprolol tartrate (LOPRESSOR) 25 MG tablet Take 25 mg by mouth 2 (two) times daily.  02/18/13  Yes Historical Provider, MD  Multiple Vitamins-Minerals (PRESERVISION/LUTEIN PO) Take 2 tablets by mouth every morning.    Yes Historical Provider, MD  omeprazole ([place])  20 MG capsule Take 40 mg by mouth daily.   Yes Historical Provider, MD  pravastatin (PRAVACHOL) 40 MG tablet Take 40 mg by mouth every evening.    Yes Historical Provider, MD  Propylene Glycol (SYSTANE BALANCE) 0.6 % SOLN Place 1 drop into both eyes daily.    Yes Historical Provider, MD  traMADol (ULTRAM) 50 MG tablet Take 100 mg by mouth 3 (three) times daily.   Yes Historical Provider, MD  valsartan (DIOVAN) 320 MG tablet Take 0.5 tablets (160 mg total)  by mouth every morning. Patient taking differently: Take 320 mg by mouth daily.  04/26/14  Yes Nita Sells, MD    Allergies:  Allergies  Allergen Reactions  . Clonidine Derivatives     Local reaction to patch.  . Ezetimibe-Simvastatin Other (See Comments)    Leg pain from Vytorin  . Lipitor [Atorvastatin Calcium] Other (See Comments)    Couldn't tolerate  . Metformin And Related Diarrhea  . Morphine And Related Other (See Comments)    hallucination  . Codeine Rash    Past Medical History: Past Medical History  Diagnosis Date  . Coronary artery disease   . Hypertension   . Arthritis   . Diabetes mellitus   . Renal disorder   . Sleep apnea   . Thyroid disease   . Glaucoma   . Osteopenia   . Elevated cholesterol   . Back pain   . PUD (peptic ulcer disease)   . Macular degeneration   . Typhoid fever   . COPD (chronic obstructive pulmonary disease) ([place])   . Dementia   . Back pain   . HCAP (healthcare-associated pneumonia) 07/20/2014    Past Surgical History  Procedure Laterality Date  . Cholecystectomy    . Abdominal hysterectomy    . Cataract extraction    . Back surgery    . Thyroid surgery      Social History: Patient lives in a retirement center. It's unclear if this is a skilled nursing facility or independent living facility. She apparently walks using a cane or walker. No history of smoking or alcohol use.  Family History:  Family History  Problem Relation Age of Onset  . Leukemia Mother   . Pneumonia Father   . Diabetes Brother      Review of Systems - unable to do due to her dementia and hearing impairment  Physical Examination  Filed Vitals:   03/19/15 1002 03/19/15 1217 03/19/15 1500  BP: 104/58 114/55 141/63  Pulse: 70 68 69  Temp: 98.1 F (36.7 C)    TempSrc: Oral    Resp: 18 16 14   SpO2: 93% 94% 94%    BP 141/63 mmHg  Pulse 69  Temp(Src) 98.1 F (36.7 C) (Oral)  Resp 14  SpO2 94%  General appearance: alert,  cooperative, appears stated age and no distress Head: Normocephalic, without obvious abnormality, atraumatic Eyes: conjunctivae/corneas clear. PERRL, EOM's intact.  Throat: lips, mucosa, and tongue normal; teeth and gums normal Neck: Right side of the neck is swollen. Erythematous. Warm to touch. There is also some erythema over her upper chest area in the right. Tender to palpation. Enlarged submandibular and cervical lymph nodes. Resp: clear to auscultation bilaterally Cardio: regular rate and rhythm, S1, S2 normal, no murmur, click, rub or gallop GI: soft, non-tender; bowel sounds normal; no masses,  no organomegaly Extremities: extremities normal, atraumatic, no cyanosis or edema Pulses: 2+ and symmetric Skin: Erythema noted over right neck area Lymph nodes: Enlarged cervical and  submandibular lymphadenopathy Neurologic: Awake, alert. Confused. No focal deficits noted.  Laboratory Data: Results for orders placed or performed during the hospital encounter of 03/19/15 (from the past 48 hour(s))  CBC with Differential     Status: Abnormal   Collection Time: 03/19/15 11:13 AM  Result Value Ref Range   WBC 12.1 (H) 4.0 - 10.5 K/uL   RBC 3.83 (L) 3.87 - 5.11 MIL/uL   Hemoglobin 11.5 (L) 12.0 - 15.0 g/dL   HCT 35.5 (L) 36.0 - 46.0 %   MCV 92.7 78.0 - 100.0 fL   MCH 30.0 26.0 - 34.0 pg   MCHC 32.4 30.0 - 36.0 g/dL   RDW 13.2 11.5 - 15.5 %   Platelets 255 150 - 400 K/uL   Neutrophils Relative % 64 %   Neutro Abs 7.8 (H) 1.7 - 7.7 K/uL   Lymphocytes Relative 19 %   Lymphs Abs 2.3 0.7 - 4.0 K/uL   Monocytes Relative 11 %   Monocytes Absolute 1.4 (H) 0.1 - 1.0 K/uL   Eosinophils Relative 6 %   Eosinophils Absolute 0.7 0.0 - 0.7 K/uL   Basophils Relative 0 %   Basophils Absolute 0.0 0.0 - 0.1 K/uL  Basic metabolic panel     Status: Abnormal   Collection Time: 03/19/15 11:13 AM  Result Value Ref Range   Sodium 131 (L) 135 - 145 mmol/L   Potassium 4.2 3.5 - 5.1 mmol/L   Chloride 96  (L) 101 - 111 mmol/L   CO2 25 22 - 32 mmol/L   Glucose, Bld 169 (H) 65 - 99 mg/dL   BUN 13 6 - 20 mg/dL   Creatinine, Ser 0.90 0.44 - 1.00 mg/dL   Calcium 8.5 (L) 8.9 - 10.3 mg/dL   GFR calc non Af Amer 55 (L) >60 mL/min   GFR calc Af Amer >60 >60 mL/min    Comment: (NOTE) The eGFR has been calculated using the CKD EPI equation. This calculation has not been validated in all clinical situations. eGFR's persistently <60 mL/min signify possible Chronic Kidney Disease.    Anion gap 10 5 - 15  Sedimentation rate     Status: Abnormal   Collection Time: 03/19/15 11:13 AM  Result Value Ref Range   Sed Rate 52 (H) 0 - 22 mm/hr  I-Stat Chem 8, ED     Status: Abnormal   Collection Time: 03/19/15 11:22 AM  Result Value Ref Range   Sodium 131 (L) 135 - 145 mmol/L   Potassium 4.0 3.5 - 5.1 mmol/L   Chloride 94 (L) 101 - 111 mmol/L   BUN 12 6 - 20 mg/dL   Creatinine, Ser 0.80 0.44 - 1.00 mg/dL   Glucose, Bld 168 (H) 65 - 99 mg/dL   Calcium, Ion 1.02 (L) 1.13 - 1.30 mmol/L   TCO2 24 0 - 100 mmol/L   Hemoglobin 12.9 12.0 - 15.0 g/dL   HCT 38.0 36.0 - 46.0 %    Radiology Reports: Ct Soft Tissue Neck W Contrast  03/19/2015  CLINICAL DATA:  Right facial swelling.  Leukocytosis. EXAM: CT NECK WITH CONTRAST TECHNIQUE: Multidetector CT imaging of the neck was performed using the standard protocol following the bolus administration of intravenous contrast. CONTRAST:  40m OMNIPAQUE IOHEXOL 300 MG/ML  SOLN COMPARISON:  None. FINDINGS: Pharynx and larynx: Negative for pharyngeal mass or edema. Airway is intact. Larynx is normal. Salivary glands: Parotid gland normal bilaterally. Left submandibular gland normal. Right submandibular gland appears atrophic. Lateral to the right submandibular gland are  enlarged lymph nodes with fluid-filled center. These measure 13 mm and 14 mm. There is also a 14 mm solid lymph node in this area. There is surrounding soft tissue edema and thickening of the platysmas muscle  suggesting acute infection. No calculus identified. Thyroid: Left thyroid lobe normal. Surgical resection right thyroid lobe without mass lesion. Lymph nodes: Suppurative adenopathy right submandibular region measuring 13 mm and 14 mm. Solid submandibular node on the right 14 mm. These have surrounding soft tissue edema and thickening of the platysmas muscle. There is edema in the subcutaneous fat. Findings suggest acute infection and separate adenopathy. No other adenopathy. Vascular: Extensive atherosclerotic calcification in the aortic arch proximal great vessels and carotid bifurcation bilaterally. Internal carotid artery patent bilaterally. Jugular vein patent bilaterally. Left jugular vein dominant. Limited intracranial: Generalized atrophy. No acute intracranial abnormality. Visualized orbits: Negative Mastoids and visualized paranasal sinuses: Mild mucosal edema in the right maxillary sinus. No air-fluid level. Skeleton: No evidence of dental infection. 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 Marked degeneration at C3-4 with spurring. Upper chest: Lung apices are clear. IMPRESSION: Enlarged liquefied submandibular nodes on the right. In addition, there is a solid enlarged lymph node in this area. There is surrounding soft tissue edema suggesting acute infection and suppurative adenitis. This is immediately adjacent to the submandibular gland which is atrophic. No evidence of acute dental infection. These results were called by telephone at the time of interpretation on 03/19/2015 at 12:53 pm to Dr. Deno Etienne , who verbally acknowledged these results. Electronically Signed   By: Franchot Gallo M.D.   On: 03/19/2015 12:53    Problem List  Principal Problem:   Neck infection Active Problems:   DM II (diabetes mellitus, type II), controlled (HCC)   COPD (chronic obstructive pulmonary disease) (HCC)   Dementia   Suppurative lymphadenitis   Assessment: This is a 82 year old Caucasian female with past medical  history of diabetes, dementia, hard of hearing with hearing aids, hypertension, who presents with complaints of pain and swelling in the right face and neck area. She has evidence for facial cellulitis. CT scan shows suppurative lymphadenitis.  Plan: #1 Neck/facial cellulitis with suppurative lymphadenitis: No clear-cut evidence for abscess formation. Case has been discussed by the ED physician with ENT, Dr. Janace Hoard. He recommends IV antibiotics for now. If there is no improvement, patient may need surgical intervention. She is protecting her airway at this time. Her vital signs are all stable. We will order blood cultures and lactic acid level. No clinical signs or symptoms suggestive of sepsis physiology. Treat with vancomycin and Zosyn. Patient apparently has had multiple courses of antibiotics recently for bronchitis. Hence, she needs broad-spectrum coverage for now.  #2 History of for diabetes mellitus type 2: Sliding scale insulin coverage. Check HbA1c in the morning.  #3 history of hypothyroidism: Continue with levothyroxine.  #4 history of dementia: Continue with Namenda.   #5 history of essential hypertension: Continue with beta blockers. Monitor blood pressures closely. Continue clonidine with holding parameters. Hold her ARB and amlodipine for now.   DVT Prophylaxis: Lovenox Code Status: DO NOT RESUSCITATE Family Communication: Discussed with the patient's 2 daughters  Disposition Plan: Admit to telemetry.   Further management decisions will depend on results of further testing and patient's response to treatment.   [facility]  [facility] Pager [phone]  If 7PM-7AM, please contact night-coverage [email or web address] Password [facility]  03/19/2015, 3:41 PM

## 2015-03-19 NOTE — ED Notes (Signed)
Pt can go up at 15:25

## 2015-03-19 NOTE — Consult Note (Signed)
Reason for Consult:right neck swelling Referring Physician: er  STAYCE DELANCY is an 82 y.o. female.  HPI: hx of right neck swelling since Monday, she had no URI and no previous issue. She has no dental problem on that side. She has had increased pain . No swallowing issues. No breathing problems.   Past Medical History  Diagnosis Date  . Coronary artery disease   . Hypertension   . Arthritis   . Diabetes mellitus   . Renal disorder   . Sleep apnea   . Thyroid disease   . Glaucoma   . Osteopenia   . Elevated cholesterol   . Back pain   . PUD (peptic ulcer disease)   . Macular degeneration   . Typhoid fever   . COPD (chronic obstructive pulmonary disease) ([place])   . Dementia   . Back pain   . HCAP (healthcare-associated pneumonia) 07/20/2014    Past Surgical History  Procedure Laterality Date  . Cholecystectomy    . Abdominal hysterectomy    . Cataract extraction    . Back surgery    . Thyroid surgery      Family History  Problem Relation Age of Onset  . Leukemia Mother   . Pneumonia Father   . Diabetes Brother     Social History:  reports that she has never smoked. She does not have any smokeless tobacco history on file. She reports that she does not drink alcohol or use illicit drugs.  Allergies:  Allergies  Allergen Reactions  . Clonidine Derivatives     Local reaction to patch.  . Ezetimibe-Simvastatin Other (See Comments)    Leg pain from Vytorin  . Lipitor [Atorvastatin Calcium] Other (See Comments)    Couldn't tolerate  . Metformin And Related Diarrhea  . Morphine And Related Other (See Comments)    hallucination  . Codeine Rash    Medications: I have reviewed the patient's current medications.  Results for orders placed or performed during the hospital encounter of 03/19/15 (from the past 48 hour(s))  CBC with Differential     Status: Abnormal   Collection Time: 03/19/15 11:13 AM  Result Value Ref Range   WBC 12.1 (H) 4.0 - 10.5 K/uL   RBC  3.83 (L) 3.87 - 5.11 MIL/uL   Hemoglobin 11.5 (L) 12.0 - 15.0 g/dL   HCT 35.5 (L) 36.0 - 46.0 %   MCV 92.7 78.0 - 100.0 fL   MCH 30.0 26.0 - 34.0 pg   MCHC 32.4 30.0 - 36.0 g/dL   RDW 13.2 11.5 - 15.5 %   Platelets 255 150 - 400 K/uL   Neutrophils Relative % 64 %   Neutro Abs 7.8 (H) 1.7 - 7.7 K/uL   Lymphocytes Relative 19 %   Lymphs Abs 2.3 0.7 - 4.0 K/uL   Monocytes Relative 11 %   Monocytes Absolute 1.4 (H) 0.1 - 1.0 K/uL   Eosinophils Relative 6 %   Eosinophils Absolute 0.7 0.0 - 0.7 K/uL   Basophils Relative 0 %   Basophils Absolute 0.0 0.0 - 0.1 K/uL  Basic metabolic panel     Status: Abnormal   Collection Time: 03/19/15 11:13 AM  Result Value Ref Range   Sodium 131 (L) 135 - 145 mmol/L   Potassium 4.2 3.5 - 5.1 mmol/L   Chloride 96 (L) 101 - 111 mmol/L   CO2 25 22 - 32 mmol/L   Glucose, Bld 169 (H) 65 - 99 mg/dL   BUN 13 6 - 20  mg/dL   Creatinine, Ser 0.90 0.44 - 1.00 mg/dL   Calcium 8.5 (L) 8.9 - 10.3 mg/dL   GFR calc non Af Amer 55 (L) >60 mL/min   GFR calc Af Amer >60 >60 mL/min    Comment: (NOTE) The eGFR has been calculated using the CKD EPI equation. This calculation has not been validated in all clinical situations. eGFR's persistently <60 mL/min signify possible Chronic Kidney Disease.    Anion gap 10 5 - 15  Sedimentation rate     Status: Abnormal   Collection Time: 03/19/15 11:13 AM  Result Value Ref Range   Sed Rate 52 (H) 0 - 22 mm/hr  I-Stat Chem 8, ED     Status: Abnormal   Collection Time: 03/19/15 11:22 AM  Result Value Ref Range   Sodium 131 (L) 135 - 145 mmol/L   Potassium 4.0 3.5 - 5.1 mmol/L   Chloride 94 (L) 101 - 111 mmol/L   BUN 12 6 - 20 mg/dL   Creatinine, Ser 0.80 0.44 - 1.00 mg/dL   Glucose, Bld 168 (H) 65 - 99 mg/dL   Calcium, Ion 1.02 (L) 1.13 - 1.30 mmol/L   TCO2 24 0 - 100 mmol/L   Hemoglobin 12.9 12.0 - 15.0 g/dL   HCT 38.0 36.0 - 46.0 %    Ct Soft Tissue Neck W Contrast  03/19/2015  CLINICAL DATA:  Right facial  swelling.  Leukocytosis. EXAM: CT NECK WITH CONTRAST TECHNIQUE: Multidetector CT imaging of the neck was performed using the standard protocol following the bolus administration of intravenous contrast. CONTRAST:  26m OMNIPAQUE IOHEXOL 300 MG/ML  SOLN COMPARISON:  None. FINDINGS: Pharynx and larynx: Negative for pharyngeal mass or edema. Airway is intact. Larynx is normal. Salivary glands: Parotid gland normal bilaterally. Left submandibular gland normal. Right submandibular gland appears atrophic. Lateral to the right submandibular gland are enlarged lymph nodes with fluid-filled center. These measure 13 mm and 14 mm. There is also a 14 mm solid lymph node in this area. There is surrounding soft tissue edema and thickening of the platysmas muscle suggesting acute infection. No calculus identified. Thyroid: Left thyroid lobe normal. Surgical resection right thyroid lobe without mass lesion. Lymph nodes: Suppurative adenopathy right submandibular region measuring 13 mm and 14 mm. Solid submandibular node on the right 14 mm. These have surrounding soft tissue edema and thickening of the platysmas muscle. There is edema in the subcutaneous fat. Findings suggest acute infection and separate adenopathy. No other adenopathy. Vascular: Extensive atherosclerotic calcification in the aortic arch proximal great vessels and carotid bifurcation bilaterally. Internal carotid artery patent bilaterally. Jugular vein patent bilaterally. Left jugular vein dominant. Limited intracranial: Generalized atrophy. No acute intracranial abnormality. Visualized orbits: Negative Mastoids and visualized paranasal sinuses: Mild mucosal edema in the right maxillary sinus. No air-fluid level. Skeleton: No evidence of dental infection. 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 Marked degeneration at C3-4 with spurring. Upper chest: Lung apices are clear. IMPRESSION: Enlarged liquefied submandibular nodes on the right. In addition, there is a solid  enlarged lymph node in this area. There is surrounding soft tissue edema suggesting acute infection and suppurative adenitis. This is immediately adjacent to the submandibular gland which is atrophic. No evidence of acute dental infection. These results were called by telephone at the time of interpretation on 03/19/2015 at 12:53 pm to Dr. DDeno Etienne, who verbally acknowledged these results. Electronically Signed   By: CFranchot GalloM.D.   On: 03/19/2015 12:53    ROS Blood pressure 156/63,  pulse 72, temperature 97.8 F (36.6 C), temperature source Oral, resp. rate 17, height 5' 4"  (1.626 m), weight 67.8 kg (149 lb 7.6 oz), SpO2 98 %. Physical Exam  Constitutional: She appears well-developed and well-nourished.  HENT:  No lesion in the mouth. No trismus. Teeth seem to be in good repair. Right submandibular swelling and tenderness. There is erythema over the right neck and into the submental area. Op- no swelling No stridor  Eyes: Conjunctivae are normal. Pupils are equal, round, and reactive to light.    Assessment/Plan: Right submandibular abscess- there is a lot of inflammation around this area but the abscess areas are only about 1.3 cms. The Ct scan was reviewed. We discussed the situation and she may respond to IV abx without I/D. Needle aspiration also may be benficial. I will see her tomorrow and if progressing to improvement will continue to medically treat  Lori Long 03/19/2015, 5:15 PM

## 2015-03-19 NOTE — Progress Notes (Signed)
Received pt from ED, bedside report given, telemetry box applied, VS obtained, oriented to unit, call light placed with  In reach

## 2015-03-20 DIAGNOSIS — K122 Cellulitis and abscess of mouth: Secondary | ICD-10-CM | POA: Diagnosis present

## 2015-03-20 LAB — COMPREHENSIVE METABOLIC PANEL
ALT: 8 U/L — AB (ref 14–54)
AST: 13 U/L — AB (ref 15–41)
Albumin: 2.9 g/dL — ABNORMAL LOW (ref 3.5–5.0)
Alkaline Phosphatase: 65 U/L (ref 38–126)
Anion gap: 7 (ref 5–15)
BILIRUBIN TOTAL: 0.7 mg/dL (ref 0.3–1.2)
BUN: 8 mg/dL (ref 6–20)
CALCIUM: 7.8 mg/dL — AB (ref 8.9–10.3)
CO2: 25 mmol/L (ref 22–32)
CREATININE: 0.99 mg/dL (ref 0.44–1.00)
Chloride: 100 mmol/L — ABNORMAL LOW (ref 101–111)
GFR calc Af Amer: 57 mL/min — ABNORMAL LOW (ref >60)
GFR, EST NON AFRICAN AMERICAN: 49 mL/min — AB (ref >60)
Glucose, Bld: 146 mg/dL — ABNORMAL HIGH (ref 65–99)
Potassium: 3.8 mmol/L (ref 3.5–5.1)
Sodium: 132 mmol/L — ABNORMAL LOW (ref 135–145)
TOTAL PROTEIN: 5.5 g/dL — AB (ref 6.5–8.1)

## 2015-03-20 LAB — GLUCOSE, CAPILLARY
GLUCOSE-CAPILLARY: 120 mg/dL — AB (ref 65–99)
GLUCOSE-CAPILLARY: 155 mg/dL — AB (ref 65–99)
Glucose-Capillary: 115 mg/dL — ABNORMAL HIGH (ref 65–99)
Glucose-Capillary: 112 mg/dL — ABNORMAL HIGH (ref 65–99)

## 2015-03-20 LAB — MRSA PCR SCREENING: MRSA by PCR: NEGATIVE

## 2015-03-20 LAB — CBC
HCT: 32.7 % — ABNORMAL LOW (ref 36.0–46.0)
Hemoglobin: 10.5 g/dL — ABNORMAL LOW (ref 12.0–15.0)
MCH: 30 pg (ref 26.0–34.0)
MCHC: 32.1 g/dL (ref 30.0–36.0)
MCV: 93.4 fL (ref 78.0–100.0)
PLATELETS: 221 10*3/uL (ref 150–400)
RBC: 3.5 MIL/uL — ABNORMAL LOW (ref 3.87–5.11)
RDW: 13.4 % (ref 11.5–15.5)
WBC: 10.8 10*3/uL — AB (ref 4.0–10.5)

## 2015-03-20 LAB — HIV ANTIBODY (ROUTINE TESTING W REFLEX): HIV SCREEN 4TH GENERATION: NONREACTIVE

## 2015-03-20 NOTE — Progress Notes (Signed)
Subjective: She has slight less erythema but still very swollen and tender.   Objective: Vital signs in last 24 hours: Temp:  [97.8 F (36.6 C)-98.9 F (37.2 C)] 98.2 F (36.8 C) (02/09 0629) Pulse Rate:  [66-79] 66 (02/09 0629) Resp:  [14-18] 18 (02/09 0629) BP: (104-156)/(54-63) 114/59 mmHg (02/09 0629) SpO2:  [93 %-98 %] 98 % (02/09 0629) Weight:  [67.8 kg (149 lb 7.6 oz)] 67.8 kg (149 lb 7.6 oz) (02/08 1548) Last BM Date: 03/16/15  Intake/Output from previous day: 02/08 0701 - 02/09 0700 In: 707.5 [I.V.:357.5; IV Piggyback:350] Out: 650 [Urine:650] Intake/Output this shift:    right submandibular area with less erythema and still same swelling and tenderness.   Lab Results:   Recent Labs  03/19/15 1113 03/19/15 1122 03/20/15 0525  WBC 12.1*  --  10.8*  HGB 11.5* 12.9 10.5*  HCT 35.5* 38.0 32.7*  PLT 255  --  221   BMET  Recent Labs  03/19/15 1113 03/19/15 1122 03/20/15 0525  NA 131* 131* 132*  K 4.2 4.0 3.8  CL 96* 94* 100*  CO2 25  --  25  GLUCOSE 169* 168* 146*  BUN 13 12 8   CREATININE 0.90 0.80 0.99  CALCIUM 8.5*  --  7.8*   PT/INR No results for input(s): LABPROT, INR in the last 72 hours. ABG No results for input(s): PHART, HCO3 in the last 72 hours.  Invalid input(s): PCO2, PO2  Studies/Results: Ct Soft Tissue Neck W Contrast  03/19/2015  CLINICAL DATA:  Right facial swelling.  Leukocytosis. EXAM: CT NECK WITH CONTRAST TECHNIQUE: Multidetector CT imaging of the neck was performed using the standard protocol following the bolus administration of intravenous contrast. CONTRAST:  5mL OMNIPAQUE IOHEXOL 300 MG/ML  SOLN COMPARISON:  None. FINDINGS: Pharynx and larynx: Negative for pharyngeal mass or edema. Airway is intact. Larynx is normal. Salivary glands: Parotid gland normal bilaterally. Left submandibular gland normal. Right submandibular gland appears atrophic. Lateral to the right submandibular gland are enlarged lymph nodes with  fluid-filled center. These measure 13 mm and 14 mm. There is also a 14 mm solid lymph node in this area. There is surrounding soft tissue edema and thickening of the platysmas muscle suggesting acute infection. No calculus identified. Thyroid: Left thyroid lobe normal. Surgical resection right thyroid lobe without mass lesion. Lymph nodes: Suppurative adenopathy right submandibular region measuring 13 mm and 14 mm. Solid submandibular node on the right 14 mm. These have surrounding soft tissue edema and thickening of the platysmas muscle. There is edema in the subcutaneous fat. Findings suggest acute infection and separate adenopathy. No other adenopathy. Vascular: Extensive atherosclerotic calcification in the aortic arch proximal great vessels and carotid bifurcation bilaterally. Internal carotid artery patent bilaterally. Jugular vein patent bilaterally. Left jugular vein dominant. Limited intracranial: Generalized atrophy. No acute intracranial abnormality. Visualized orbits: Negative Mastoids and visualized paranasal sinuses: Mild mucosal edema in the right maxillary sinus. No air-fluid level. Skeleton: No evidence of dental infection. 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 Marked degeneration at C3-4 with spurring. Upper chest: Lung apices are clear. IMPRESSION: Enlarged liquefied submandibular nodes on the right. In addition, there is a solid enlarged lymph node in this area. There is surrounding soft tissue edema suggesting acute infection and suppurative adenitis. This is immediately adjacent to the submandibular gland which is atrophic. No evidence of acute dental infection. These results were called by telephone at the time of interpretation on 03/19/2015 at 12:53 pm to Dr. Deno Etienne , who verbally acknowledged these  results. Electronically Signed   By: Franchot Gallo M.D.   On: 03/19/2015 12:53    Anti-infectives: Anti-infectives    Start     Dose/Rate Route Frequency Ordered Stop   03/20/15 0600   vancomycin (VANCOCIN) 500 mg in sodium chloride 0.9 % 100 mL IVPB     500 mg 100 mL/hr over 60 Minutes Intravenous Every 12 hours 03/19/15 1610     03/20/15 0000  piperacillin-tazobactam (ZOSYN) IVPB 3.375 g     3.375 g 12.5 mL/hr over 240 Minutes Intravenous Every 8 hours 03/19/15 1610     03/19/15 1700  vancomycin (VANCOCIN) IVPB 1000 mg/200 mL premix     1,000 mg 200 mL/hr over 60 Minutes Intravenous  Once 03/19/15 1551 03/19/15 1754   03/19/15 1630  piperacillin-tazobactam (ZOSYN) IVPB 3.375 g     3.375 g 100 mL/hr over 30 Minutes Intravenous  Once 03/19/15 1558 03/19/15 1724   03/19/15 1045  clindamycin (CLEOCIN) IVPB 900 mg     900 mg 100 mL/hr over 30 Minutes Intravenous  Once 03/19/15 1035 03/19/15 1142      Assessment/Plan: s/p * No surgery found * it has been less than 24 hours of abx so still may get a response but I think setting her up for I/D tomorrow would be best. If much better tomorrow can cancel.  Will discuss with the family and get consent.   LOS: 1 day    Lori Long 03/20/2015

## 2015-03-20 NOTE — Care Management Note (Signed)
Case Management Note  Patient Details  Name: Lori Long MRN: [id number] Date of Birth: 1923-05-31  Subjective/Objective: 82 y/o f admitted w/neck pain,cellulitis. From ALF. PT cons-can return ALF.CSW following.                   Action/Plan:d/c plan ALF   Expected Discharge Date:   (unknown)               Expected Discharge Plan:  Assisted Living / Rest Home  In-House Referral:  Clinical Social Work  Discharge planning Services  CM Consult  Post Acute Care Choice:    Choice offered to:     DME Arranged:    DME Agency:     HH Arranged:    [place] Agency:     Status of Service:  In process, will continue to follow  Medicare Important Message Given:    Date Medicare IM Given:    Medicare IM give by:    Date Additional Medicare IM Given:    Additional Medicare Important Message give by:     If discussed at [place] of Stay Meetings, dates discussed:    Additional Comments:  Dessa Phi, RN 03/20/2015, 1:43 PM

## 2015-03-20 NOTE — Clinical Social Work Note (Signed)
Clinical Social Work Assessment  Patient Details  Name: ANU CAMILLERI MRN: [id number] Date of Birth: Jul 03, 1923  Date of referral:  03/20/15               Reason for consult:  Facility Placement                Permission sought to share information with:  Facility Art therapist granted to share information::  Yes, Verbal Permission Granted  Name::        Agency::     Relationship::     Contact Information:     Housing/Transportation Living arrangements for the past 2 months:  [place] of Information:  Patient, Adult Children Patient Interpreter Needed:  None Criminal Activity/Legal Involvement Pertinent to Current Situation/Hospitalization:  No - Comment as needed Significant Relationships:  Adult Children Lives with:  Facility Resident Do you feel safe going back to the place where you live?  Yes Need for family participation in patient care:  Yes (Comment)  Care giving concerns:  CSW received consult that patient was admitted from [facility] ALF.   Social Worker assessment / plan:  CSW confirmed with patient & daughters, Jolayne Haines at bedside that patient will be returning to ALF at discharge.   Employment status:  Retired Forensic scientist:  Medicaid In [place], [employer] PT Recommendations:  No Follow Up, [place] / Referral to community resources:     Patient/Family's Response to care:  Patient recently admitted to [facility] ALF from home on 02/14/15, patient has been pleased with the care she's been receiving.   Patient/Family's Understanding of and Emotional Response to Diagnosis, Current Treatment, and Prognosis:   Emotional Assessment Appearance:  Appears stated age Attitude/Demeanor/Rapport:    Affect (typically observed):  Accepting, Pleasant, Quiet Orientation:  Oriented to Self, Oriented to Place, Oriented to  Time, Oriented to Situation Alcohol /  Substance use:    Psych involvement (Current and /or in the community):     Discharge Needs  Concerns to be addressed:    Readmission within the last 30 days:    Current discharge risk:    Barriers to Discharge:      Standley Brooking, LCSW 03/20/2015, 11:34 AM

## 2015-03-20 NOTE — Progress Notes (Signed)
Patient is for transfer to Lori Long at [facility], report given to [facility].

## 2015-03-20 NOTE — Evaluation (Signed)
Physical Therapy Evaluation Patient Details Name: Lori Long MRN: [id number] DOB: [birth year] Today's Date: 03/20/2015   History of Present Illness  82 yo female admitted with neck infection, submandibular abscess. Hx of falls, DM, dementia, HTN, macular degeneration, COPD, back pain, osteopenia.   Clinical Impression  On eval, pt required Min guard-Min assist for mobility-walked ~150 feet with RW. Pt tolerated activity fairly well. Daughters present during session. Plan is for pt to return to ALF once ready.     Follow Up Recommendations No PT follow up;Supervision/Assistance - 24 hour (return to ALF)    Equipment Recommendations  None recommended by PT    Recommendations for Other Services       Precautions / Restrictions Precautions Precautions: Fall Restrictions Weight Bearing Restrictions: No      Mobility  Bed Mobility Overal bed mobility: Needs Assistance Bed Mobility: Supine to Sit;Sit to Supine     Supine to sit: Min guard Sit to supine: Min guard   General bed mobility comments: close guard for safety. increased time  Transfers Overall transfer level: Needs assistance   Transfers: Sit to/from Stand Sit to Stand: Min assist         General transfer comment: assist to rise, stabilize, control descent.   Ambulation/Gait Ambulation/Gait assistance: Min guard Ambulation Distance (Feet): 150 Feet Assistive device: Rolling walker (2 wheeled) Gait Pattern/deviations: Step-through pattern;Decreased stride length;Trunk flexed     General Gait Details: close guard for safety. slow gait speed.   Stairs            Wheelchair Mobility    Modified Rankin (Stroke Patients Only)       Balance Overall balance assessment: Needs assistance;History of Falls         Standing balance support: Bilateral upper extremity supported;During functional activity   Standing balance comment: requires external support                              Pertinent Vitals/Pain Pain Assessment: Faces Faces Pain Scale: Hurts little more Pain Location: back Pain Descriptors / Indicators: Sore;Aching Pain Intervention(s): Monitored during session;Repositioned    Home Living Family/patient expects to be discharged to:: Assisted living               Home Equipment: Walker - 2 wheels;Cane - quad      Prior Function Level of Independence: Needs assistance   Gait / Transfers Assistance Needed: uses walker (sometimes quad cane)  ADL's / Homemaking Assistance Needed: meals, medicine management. Pt able to bathe/dress herself-sponge bathe. aides at facility help with shower        Hand Dominance        Extremity/Trunk Assessment   Upper Extremity Assessment: Defer to OT evaluation           Lower Extremity Assessment: Generalized weakness      Cervical / Trunk Assessment: Kyphotic  Communication   Communication: HOH  Cognition Arousal/Alertness: Awake/alert Behavior During Therapy: WFL for tasks assessed/performed Overall Cognitive Status: History of cognitive impairments - at baseline                      General Comments      Exercises        Assessment/Plan    PT Assessment Patient needs continued PT services  PT Diagnosis Difficulty walking;Generalized weakness   PT Problem List Decreased strength;Decreased activity tolerance;Decreased balance;Decreased mobility;Decreased cognition;Pain  PT Treatment Interventions DME instruction;Gait training;Functional  mobility training;Therapeutic activities;Patient/family education;Balance training;Therapeutic exercise   PT Goals (Current goals can be found in the Care Plan section) Acute Rehab PT Goals Patient Stated Goal: none stated PT Goal Formulation: With patient/family Time For Goal Achievement: 03/22/2015 Potential to Achieve Goals: Fair    Frequency Min 3X/week   Barriers to discharge        Co-evaluation               End of  Session Equipment Utilized During Treatment: Gait belt Activity Tolerance: Patient tolerated treatment well Patient left: in bed;with call bell/phone within reach;with bed alarm set;with family/visitor present           Time: [id number] PT Time Calculation (min) (ACUTE ONLY): 25 min   Charges:   PT Evaluation $PT Eval Moderate Complexity: 1 Procedure     PT G Codes:        Weston Anna, MPT Pager: [phone]

## 2015-03-20 NOTE — Evaluation (Signed)
Occupational Therapy Evaluation Patient Details Name: Lori Long MRN: [id number] DOB: 1924-01-07 Today's Date: 03/20/2015    History of Present Illness 82 yo female admitted with neck infection, submandibular abscess. Hx of falls, DM, dementia, HTN, macular degeneration, COPD, back pain, osteopenia.    Clinical Impression   Pt admitted with neck infection. Pt currently with functional limitations due to the deficits listed below (see OT Problem List).  Pt will benefit from skilled OT to increase their safety and independence with ADL and functional mobility for ADL to facilitate discharge to venue listed below.      Follow Up Recommendations  Home health OT;Supervision/Assistance - 24 hour    Equipment Recommendations  None recommended by OT    Recommendations for Other Services       Precautions / Restrictions Precautions Precautions: Fall Restrictions Weight Bearing Restrictions: No      Mobility Bed Mobility Overal bed mobility: Independent Bed Mobility: Supine to Sit;Sit to Supine     Supine to sit: Min guard Sit to supine: Min guard   General bed mobility comments: close guard for safety. increased time  Transfers Overall transfer level: Needs assistance Equipment used: Rolling walker (2 wheeled) Transfers: Sit to/from [facility] Sit to Stand: Supervision Stand pivot transfers: Supervision       General transfer comment: assist to rise, stabilize, control descent.     Balance Overall balance assessment: Needs assistance;History of Falls         Standing balance support: Bilateral upper extremity supported;During functional activity   Standing balance comment: requires external support                            ADL Overall ADL's : At baseline                                                       Pertinent Vitals/Pain Pain Assessment: No/denies pain Faces Pain Scale: Hurts little  more Pain Location: back Pain Descriptors / Indicators: Sore;Aching Pain Intervention(s): Monitored during session;Repositioned     Hand Dominance     Extremity/Trunk Assessment Upper Extremity Assessment Upper Extremity Assessment: Generalized weakness   Lower Extremity Assessment Lower Extremity Assessment: Generalized weakness   Cervical / Trunk Assessment Cervical / Trunk Assessment: Kyphotic   Communication Communication Communication: HOH   Cognition Arousal/Alertness: Awake/alert Behavior During Therapy: WFL for tasks assessed/performed Overall Cognitive Status: History of cognitive impairments - at baseline                                Home Living Family/patient expects to be discharged to:: Assisted living                             Home Equipment: Walker - 2 wheels;Cane - quad          Prior Functioning/Environment Level of Independence: Needs assistance  Gait / Transfers Assistance Needed: uses walker (sometimes quad cane) ADL's / Homemaking Assistance Needed: meals, medicine management. Pt able to bathe/dress herself-sponge bathe. aides at facility help with shower        OT Diagnosis: Generalized weakness   OT Problem List: Decreased strength  OT Goals(Current goals can be found in the care plan section) Acute Rehab OT Goals Patient Stated Goal: none stated  OT Frequency:      End of Session Equipment Utilized During Treatment: Rolling walker Nurse Communication: Mobility status  Activity Tolerance: Patient tolerated treatment well Patient left:     Time: [id number] OT Time Calculation (min): 17 min Charges:  OT General Charges $OT Visit: 1 Procedure OT Evaluation $OT Eval Low Complexity: 1 Procedure G-Codes:    Payton Mccallum D 22-Mar-2015, 2:02 PM

## 2015-03-20 NOTE — Progress Notes (Signed)
[facility] PROGRESS NOTE  Lori Long [id number] DOB: May 08, 1923 DOA: 03/19/2015 PCP: Marjorie Smolder, MD  Assessment/Plan: #1 neck/facial cellulitis with supportive lymphadenitis/right submandibular abscess Questionable etiology. Blood cultures pending. Patient with no significant clinical improvement. Currently afebrile. Leukocytosis trending down. Continue empiric IV vancomycin and IV Zosyn. Patient has been seen in consultation by ENT who is recommended patient be transferred to [facility] for possible I and D tomorrow. Will make patient nothing by mouth after midnight. Continue empiric IV antibiotics, supportive care. Per ENT.  #2 diabetes mellitus type 2 Check a hemoglobin A1c. CBGs have ranged from 115-155. Continue sliding scale insulin.  #3 hypothyroidism Continue Synthroid.  #4 history of dementia Stable. Continue Namenda.  #5 history of hypertension Continue beta blocker, clonidine. ARB and Norvasc on hold.  #6 prophylaxis Lovenox for DVT prophylaxis.  Code Status: DO NOT RESUSCITATE Family Communication: Updated patient and family at bedside. Disposition Plan: Patient to be transferred to [facility] for possible I and D tomorrow per ENT rxcs.   Consultants:  ENT:Dr Janace Hoard 03/19/2015  Procedures:  2-D echo 04/25/2014  CT soft tissue of the neck 03/19/2015  Antibiotics:  IV vancomycin 03/19/2015  IV Zosyn 03/19/2015    HPI/Subjective: Patient still complaining of some right jaw pain. Patient denies any shortness of breath. Patient denies any difficulty swallowing.  Objective: Filed Vitals:   03/20/15 0629 03/20/15 1000  BP: 114/59   Pulse: 66 72  Temp: 98.2 F (36.8 C)   Resp: 18     Intake/Output Summary (Last 24 hours) at 03/20/15 1116 Last data filed at 03/20/15 0936  Gross per 24 hour  Intake  947.5 ml  Output   1050 ml  Net -102.5 ml   Filed Weights   03/19/15 1548  Weight: 67.8 kg (149 lb 7.6 oz)     Exam:   General:  NAD. Right jaw swelling. Tenderness to palpation in the right jaw/submandibular region. Some erythema over the right lateral neck. No stridor.  Cardiovascular: Regular rate rhythm no murmurs rubs or gallops.  Respiratory: Clear to auscultation bilaterally. No wheezes, no crackles, no rhonchi.  Abdomen: Soft, nontender, nondistended, positive bowel sounds  Musculoskeletal: No clubbing cyanosis or edema.  Data Reviewed: Basic Metabolic Panel:  Recent Labs Lab 03/19/15 1113 03/19/15 1122 03/20/15 0525  NA 131* 131* 132*  K 4.2 4.0 3.8  CL 96* 94* 100*  CO2 25  --  25  GLUCOSE 169* 168* 146*  BUN 13 12 8   CREATININE 0.90 0.80 0.99  CALCIUM 8.5*  --  7.8*   Liver Function Tests:  Recent Labs Lab 03/20/15 0525  AST 13*  ALT 8*  ALKPHOS 65  BILITOT 0.7  PROT 5.5*  ALBUMIN 2.9*   No results for input(s): LIPASE, AMYLASE in the last 168 hours. No results for input(s): AMMONIA in the last 168 hours. CBC:  Recent Labs Lab 03/19/15 1113 03/19/15 1122 03/20/15 0525  WBC 12.1*  --  10.8*  NEUTROABS 7.8*  --   --   HGB 11.5* 12.9 10.5*  HCT 35.5* 38.0 32.7*  MCV 92.7  --  93.4  PLT 255  --  221   Cardiac Enzymes: No results for input(s): CKTOTAL, CKMB, CKMBINDEX, TROPONINI in the last 168 hours. BNP (last 3 results) No results for input(s): BNP in the last 8760 hours.  ProBNP (last 3 results) No results for input(s): PROBNP in the last 8760 hours.  CBG:  Recent Labs Lab 03/19/15 1802 03/20/15 0806  GLUCAP 154*  120*    Recent Results (from the past 240 hour(s))  MRSA PCR Screening     Status: None   Collection Time: 03/19/15 10:04 PM  Result Value Ref Range Status   MRSA by PCR NEGATIVE NEGATIVE Final    Comment:        The GeneXpert MRSA Assay (FDA approved for NASAL specimens only), is one component of a comprehensive MRSA colonization surveillance program. It is not intended to diagnose MRSA infection nor to guide  or monitor treatment for MRSA infections.      Studies: Ct Soft Tissue Neck W Contrast  03/19/2015  CLINICAL DATA:  Right facial swelling.  Leukocytosis. EXAM: CT NECK WITH CONTRAST TECHNIQUE: Multidetector CT imaging of the neck was performed using the standard protocol following the bolus administration of intravenous contrast. CONTRAST:  71mL OMNIPAQUE IOHEXOL 300 MG/ML  SOLN COMPARISON:  None. FINDINGS: Pharynx and larynx: Negative for pharyngeal mass or edema. Airway is intact. Larynx is normal. Salivary glands: Parotid gland normal bilaterally. Left submandibular gland normal. Right submandibular gland appears atrophic. Lateral to the right submandibular gland are enlarged lymph nodes with fluid-filled center. These measure 13 mm and 14 mm. There is also a 14 mm solid lymph node in this area. There is surrounding soft tissue edema and thickening of the platysmas muscle suggesting acute infection. No calculus identified. Thyroid: Left thyroid lobe normal. Surgical resection right thyroid lobe without mass lesion. Lymph nodes: Suppurative adenopathy right submandibular region measuring 13 mm and 14 mm. Solid submandibular node on the right 14 mm. These have surrounding soft tissue edema and thickening of the platysmas muscle. There is edema in the subcutaneous fat. Findings suggest acute infection and separate adenopathy. No other adenopathy. Vascular: Extensive atherosclerotic calcification in the aortic arch proximal great vessels and carotid bifurcation bilaterally. Internal carotid artery patent bilaterally. Jugular vein patent bilaterally. Left jugular vein dominant. Limited intracranial: Generalized atrophy. No acute intracranial abnormality. Visualized orbits: Negative Mastoids and visualized paranasal sinuses: Mild mucosal edema in the right maxillary sinus. No air-fluid level. Skeleton: No evidence of dental infection. 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 Marked degeneration at C3-4 with spurring.  Upper chest: Lung apices are clear. IMPRESSION: Enlarged liquefied submandibular nodes on the right. In addition, there is a solid enlarged lymph node in this area. There is surrounding soft tissue edema suggesting acute infection and suppurative adenitis. This is immediately adjacent to the submandibular gland which is atrophic. No evidence of acute dental infection. These results were called by telephone at the time of interpretation on 03/19/2015 at 12:53 pm to Dr. Deno Etienne , who verbally acknowledged these results. Electronically Signed   By: Franchot Gallo M.D.   On: 03/19/2015 12:53    Scheduled Meds: . antiseptic oral rinse  7 mL Mouth Rinse q12n4p  . brimonidine  1 drop Both Eyes Daily   And  . timolol  1 drop Both Eyes Daily  . chlorhexidine  15 mL Mouth Rinse BID  . clonazePAM  0.5 mg Oral QHS  . cloNIDine  0.1 mg Oral QHS  . docusate sodium  200 mg Oral BID  . enoxaparin (LOVENOX) injection  40 mg Subcutaneous Q24H  . gabapentin  600 mg Oral TID  . insulin aspart  0-15 Units Subcutaneous TID WC  . levothyroxine  50 mcg Oral QAC breakfast  . memantine  5 mg Oral BID  . metoprolol tartrate  25 mg Oral BID  . piperacillin-tazobactam (ZOSYN)  IV  3.375 g Intravenous Q8H  .  pravastatin  40 mg Oral QPM  . sodium chloride flush  3 mL Intravenous Q12H  . vancomycin  500 mg Intravenous Q12H   Continuous Infusions: . sodium chloride 75 mL/hr at 03/20/15 [id number]    Principal Problem:   Neck infection Active Problems:   Suppurative lymphadenitis   Hypertension   DM II (diabetes mellitus, type II), controlled (HCC)   COPD (chronic obstructive pulmonary disease) (HCC)   Dementia    Time spent: 34 minutes    Chevon Laufer M.D. [facility] Pager [phone]. If 7PM-7AM, please contact night-coverage at [email or web address], password [facility] 03/20/2015, 11:16 AM  LOS: 1 day

## 2015-03-21 ENCOUNTER — Inpatient Hospital Stay (HOSPITAL_COMMUNITY): Payer: Medicare Other | Admitting: Certified Registered Nurse Anesthetist

## 2015-03-21 ENCOUNTER — Encounter (HOSPITAL_COMMUNITY)
Admission: EM | Disposition: A | Discharge status: Nursing Home (Includes ICF) | Payer: Self-pay | Source: Home / Self Care | Attending: Internal Medicine

## 2015-03-21 DIAGNOSIS — K122 Cellulitis and abscess of mouth: Secondary | ICD-10-CM

## 2015-03-21 DIAGNOSIS — B999 Unspecified infectious disease: Secondary | ICD-10-CM

## 2015-03-21 DIAGNOSIS — F0390 Unspecified dementia without behavioral disturbance: Secondary | ICD-10-CM

## 2015-03-21 DIAGNOSIS — L049 Acute lymphadenitis, unspecified: Secondary | ICD-10-CM

## 2015-03-21 HISTORY — PX: INCISION AND DRAINAGE ABSCESS: SHX5864

## 2015-03-21 LAB — HEMOGLOBIN A1C
Hgb A1c MFr Bld: 7.1 % — ABNORMAL HIGH (ref 4.8–5.6)
MEAN PLASMA GLUCOSE: 157 mg/dL

## 2015-03-21 LAB — GLUCOSE, CAPILLARY
GLUCOSE-CAPILLARY: 110 mg/dL — AB (ref 65–99)
GLUCOSE-CAPILLARY: 154 mg/dL — AB (ref 65–99)
GLUCOSE-CAPILLARY: 169 mg/dL — AB (ref 65–99)
Glucose-Capillary: 162 mg/dL — ABNORMAL HIGH (ref 65–99)
Glucose-Capillary: 107 mg/dL — ABNORMAL HIGH (ref 65–99)

## 2015-03-21 SURGERY — INCISION AND DRAINAGE, ABSCESS
Anesthesia: General | Laterality: Right

## 2015-03-21 MED ORDER — PROPOFOL 10 MG/ML IV BOLUS
INTRAVENOUS | Status: AC
  Filled 2015-03-21: qty 20

## 2015-03-21 MED ORDER — LIDOCAINE HCL (CARDIAC) 20 MG/ML IV SOLN
INTRAVENOUS | Status: DC | PRN
  Administered 2015-03-21: 100 mg via INTRAVENOUS

## 2015-03-21 MED ORDER — MORPHINE SULFATE (PF) 2 MG/ML IV SOLN
1.0000 mg | INTRAVENOUS | Status: DC | PRN
  Administered 2015-03-21 – 2015-03-25 (×6): 1 mg via INTRAVENOUS
  Filled 2015-03-21 (×6): qty 1

## 2015-03-21 MED ORDER — LACTATED RINGERS IV SOLN
INTRAVENOUS | Status: DC
  Administered 2015-03-21: 13:00:00 via INTRAVENOUS

## 2015-03-21 MED ORDER — SUGAMMADEX SODIUM 500 MG/5ML IV SOLN
INTRAVENOUS | Status: AC
  Filled 2015-03-21: qty 5

## 2015-03-21 MED ORDER — HYDROMORPHONE HCL 1 MG/ML IJ SOLN
INTRAMUSCULAR | Status: AC
  Administered 2015-03-21: 0.5 mg via INTRAVENOUS
  Filled 2015-03-21: qty 1

## 2015-03-21 MED ORDER — PROPOFOL 10 MG/ML IV BOLUS
INTRAVENOUS | Status: DC | PRN
  Administered 2015-03-21: 150 mg via INTRAVENOUS

## 2015-03-21 MED ORDER — FENTANYL CITRATE (PF) 100 MCG/2ML IJ SOLN
INTRAMUSCULAR | Status: DC | PRN
  Administered 2015-03-21 (×2): 50 ug via INTRAVENOUS

## 2015-03-21 MED ORDER — FENTANYL CITRATE (PF) 100 MCG/2ML IJ SOLN
25.0000 ug | INTRAMUSCULAR | Status: AC | PRN
  Administered 2015-03-21 (×6): 25 ug via INTRAVENOUS

## 2015-03-21 MED ORDER — ROCURONIUM BROMIDE 100 MG/10ML IV SOLN
INTRAVENOUS | Status: DC | PRN
  Administered 2015-03-21: 25 mg via INTRAVENOUS

## 2015-03-21 MED ORDER — LABETALOL HCL 5 MG/ML IV SOLN
INTRAVENOUS | Status: DC | PRN
  Administered 2015-03-21: 10 mg via INTRAVENOUS

## 2015-03-21 MED ORDER — HYDRALAZINE HCL 20 MG/ML IJ SOLN
INTRAMUSCULAR | Status: DC | PRN
  Administered 2015-03-21: 10 mg via INTRAVENOUS

## 2015-03-21 MED ORDER — LABETALOL HCL 5 MG/ML IV SOLN
5.0000 mg | INTRAVENOUS | Status: DC | PRN
  Administered 2015-03-21: 5 mg via INTRAVENOUS

## 2015-03-21 MED ORDER — FENTANYL CITRATE (PF) 100 MCG/2ML IJ SOLN
INTRAMUSCULAR | Status: AC
  Administered 2015-03-21: 25 ug via INTRAVENOUS
  Filled 2015-03-21: qty 2

## 2015-03-21 MED ORDER — HYDRALAZINE HCL 20 MG/ML IJ SOLN
5.0000 mg | Freq: Once | INTRAMUSCULAR | Status: AC
  Administered 2015-03-21: 5 mg via INTRAVENOUS
  Filled 2015-03-21: qty 1

## 2015-03-21 MED ORDER — FENTANYL CITRATE (PF) 250 MCG/5ML IJ SOLN
INTRAMUSCULAR | Status: AC
  Filled 2015-03-21: qty 5

## 2015-03-21 MED ORDER — LIDOCAINE-EPINEPHRINE 1 %-1:100000 IJ SOLN
INTRAMUSCULAR | Status: AC
  Filled 2015-03-21: qty 1

## 2015-03-21 MED ORDER — 0.9 % SODIUM CHLORIDE (POUR BTL) OPTIME
TOPICAL | Status: DC | PRN
  Administered 2015-03-21: 1000 mL

## 2015-03-21 MED ORDER — LABETALOL HCL 5 MG/ML IV SOLN
INTRAVENOUS | Status: AC
  Filled 2015-03-21: qty 4

## 2015-03-21 MED ORDER — SUCCINYLCHOLINE CHLORIDE 20 MG/ML IJ SOLN
INTRAMUSCULAR | Status: DC | PRN
  Administered 2015-03-21: 100 mg via INTRAVENOUS

## 2015-03-21 MED ORDER — CEFAZOLIN SODIUM-DEXTROSE 2-3 GM-% IV SOLR
INTRAVENOUS | Status: AC
  Filled 2015-03-21: qty 50

## 2015-03-21 MED ORDER — SUGAMMADEX SODIUM 500 MG/5ML IV SOLN
INTRAVENOUS | Status: DC | PRN
  Administered 2015-03-21: 280 mg via INTRAVENOUS

## 2015-03-21 SURGICAL SUPPLY — 45 items
ATTRACTOMAT 16X20 MAGNETIC DRP (DRAPES) IMPLANT
BLADE SURG 15 STRL LF DISP TIS (BLADE) ×1 IMPLANT
BLADE SURG 15 STRL SS (BLADE) ×2
CANISTER SUCTION 2500CC (MISCELLANEOUS) ×3 IMPLANT
CLEANER TIP ELECTROSURG 2X2 (MISCELLANEOUS) ×3 IMPLANT
CONT SPEC 4OZ CLIKSEAL STRL BL (MISCELLANEOUS) ×3 IMPLANT
COVER SURGICAL LIGHT HANDLE (MISCELLANEOUS) ×3 IMPLANT
CRADLE DONUT ADULT HEAD (MISCELLANEOUS) IMPLANT
DRAIN CHANNEL 15F RND FF W/TCR (WOUND CARE) IMPLANT
DRAIN PENROSE 1/2X12 LTX STRL (WOUND CARE) ×3 IMPLANT
DRAPE INCISE 13X13 STRL (DRAPES) IMPLANT
DRAPE PROXIMA HALF (DRAPES) ×3 IMPLANT
ELECT COATED BLADE 2.86 ST (ELECTRODE) ×3 IMPLANT
ELECT REM PT RETURN 9FT ADLT (ELECTROSURGICAL) ×3
ELECTRODE REM PT RTRN 9FT ADLT (ELECTROSURGICAL) ×1 IMPLANT
EVACUATOR SILICONE 100CC (DRAIN) IMPLANT
GAUZE SPONGE 4X4 12PLY STRL (GAUZE/BANDAGES/DRESSINGS) ×3 IMPLANT
GAUZE SPONGE 4X4 16PLY XRAY LF (GAUZE/BANDAGES/DRESSINGS) IMPLANT
GLOVE SS BIOGEL STRL SZ 7.5 (GLOVE) ×1 IMPLANT
GLOVE SUPERSENSE BIOGEL SZ 7.5 (GLOVE) ×2
GLOVE SURG SS PI 6.0 STRL IVOR (GLOVE) ×3 IMPLANT
GOWN STRL REUS W/ TWL LRG LVL3 (GOWN DISPOSABLE) ×2 IMPLANT
GOWN STRL REUS W/TWL LRG LVL3 (GOWN DISPOSABLE) ×4
KIT BASIN OR (CUSTOM PROCEDURE TRAY) ×6 IMPLANT
KIT ROOM TURNOVER OR (KITS) ×3 IMPLANT
NEEDLE HYPO 25GX1X1/2 BEV (NEEDLE) IMPLANT
NS IRRIG 1000ML POUR BTL (IV SOLUTION) ×3 IMPLANT
PAD ARMBOARD 7.5X6 YLW CONV (MISCELLANEOUS) ×6 IMPLANT
PENCIL FOOT CONTROL (ELECTRODE) ×3 IMPLANT
SOLUTION BETADINE 4OZ (MISCELLANEOUS) IMPLANT
SPONGE GAUZE 4X4 12PLY STER LF (GAUZE/BANDAGES/DRESSINGS) ×3 IMPLANT
SPONGE INTESTINAL PEANUT (DISPOSABLE) IMPLANT
STAPLER VISISTAT 35W (STAPLE) ×3 IMPLANT
SUCTION FRAZIER TIP 8 FR DISP (SUCTIONS)
SUCTION TUBE FRAZIER 8FR DISP (SUCTIONS) IMPLANT
SUT CHROMIC 4 0 PS 2 18 (SUTURE) IMPLANT
SUT ETHILON 3 0 PS 1 (SUTURE) ×3 IMPLANT
SUT ETHILON 5 0 P 3 18 (SUTURE)
SUT NYLON ETHILON 5-0 P-3 1X18 (SUTURE) IMPLANT
SUT SILK 2 0 FS (SUTURE) IMPLANT
TAPE CLOTH SURG 4X10 WHT LF (GAUZE/BANDAGES/DRESSINGS) ×3 IMPLANT
TOWEL OR 17X24 6PK STRL BLUE (TOWEL DISPOSABLE) ×3 IMPLANT
TRAY ENT MC OR (CUSTOM PROCEDURE TRAY) ×3 IMPLANT
WATER STERILE IRR 1000ML POUR (IV SOLUTION) ×3 IMPLANT
YANKAUER SUCT BULB TIP NO VENT (SUCTIONS) ×3 IMPLANT

## 2015-03-21 NOTE — Progress Notes (Signed)
Pt returned from pacu s/p I&D to right neck. Alert and able to voice needs, c/o pain to neck. Prn oxy ir administered for pain

## 2015-03-21 NOTE — Care Management Important Message (Signed)
Important Message  Patient Details  Name: GWINDA TROTMAN MRN: [id number] Date of Birth: 1923-06-14   Medicare Important Message Given:  Yes    Donique Hammonds P [place] 03/21/2015, 3:12 PM

## 2015-03-21 NOTE — Consult Note (Signed)
Reason for Consult:neck abscess Referring Physician: hospitalist  Lori Long is an 82 y.o. female.  HPI: f/u today indicates there is not significant improvement after 36 hours of IV abx. Will proceed with I/D  Past Medical History  Diagnosis Date  . Coronary artery disease   . Hypertension   . Arthritis   . Diabetes mellitus   . Renal disorder   . Sleep apnea   . Thyroid disease   . Glaucoma   . Osteopenia   . Elevated cholesterol   . Back pain   . PUD (peptic ulcer disease)   . Macular degeneration   . Typhoid fever   . COPD (chronic obstructive pulmonary disease) ([place])   . Dementia   . Back pain   . HCAP (healthcare-associated pneumonia) 07/20/2014    Past Surgical History  Procedure Laterality Date  . Cholecystectomy    . Abdominal hysterectomy    . Cataract extraction    . Back surgery    . Thyroid surgery      Family History  Problem Relation Age of Onset  . Leukemia Mother   . Pneumonia Father   . Diabetes Brother     Social History:  reports that she has never smoked. She does not have any smokeless tobacco history on file. She reports that she does not drink alcohol or use illicit drugs.  Allergies:  Allergies  Allergen Reactions  . Clonidine Derivatives     Local reaction to patch.  . Ezetimibe-Simvastatin Other (See Comments)    Leg pain from Vytorin  . Lipitor [Atorvastatin Calcium] Other (See Comments)    Couldn't tolerate  . Metformin And Related Diarrhea  . Morphine And Related Other (See Comments)    hallucination  . Codeine Rash    Medications: I have reviewed the patient's current medications.  Results for orders placed or performed during the hospital encounter of 03/19/15 (from the past 48 hour(s))  Lactic acid, plasma     Status: None   Collection Time: 03/19/15  4:31 PM  Result Value Ref Range   Lactic Acid, Venous 0.8 0.5 - 2.0 mmol/L  Culture, blood (routine x 2)     Status: None (Preliminary result)   Collection Time:  03/19/15  4:31 PM  Result Value Ref Range   Specimen Description BLOOD LEFT ARM    Special Requests BOTTLES DRAWN AEROBIC AND ANAEROBIC 5CC    Culture      NO GROWTH < 24 HOURS Performed at [facility]    Report Status PENDING   HIV antibody     Status: None   Collection Time: 03/19/15  4:31 PM  Result Value Ref Range   HIV Screen 4th Generation wRfx Non Reactive Non Reactive    Comment: (NOTE) Performed At: [facility] [address] [place], [place] [id number] Lindon Romp MD HV:[id number]   Glucose, capillary     Status: Abnormal   Collection Time: 03/19/15  6:02 PM  Result Value Ref Range   Glucose-Capillary 154 (H) 65 - 99 mg/dL  MRSA PCR Screening     Status: None   Collection Time: 03/19/15 10:04 PM  Result Value Ref Range   MRSA by PCR NEGATIVE NEGATIVE    Comment:        The GeneXpert MRSA Assay (FDA approved for NASAL specimens only), is one component of a comprehensive MRSA colonization surveillance program. It is not intended to diagnose MRSA infection nor to guide or monitor treatment for MRSA infections.  Comprehensive metabolic panel     Status: Abnormal   Collection Time: 03/20/15  5:25 AM  Result Value Ref Range   Sodium 132 (L) 135 - 145 mmol/L   Potassium 3.8 3.5 - 5.1 mmol/L   Chloride 100 (L) 101 - 111 mmol/L   CO2 25 22 - 32 mmol/L   Glucose, Bld 146 (H) 65 - 99 mg/dL   BUN 8 6 - 20 mg/dL   Creatinine, Ser 0.99 0.44 - 1.00 mg/dL   Calcium 7.8 (L) 8.9 - 10.3 mg/dL   Total Protein 5.5 (L) 6.5 - 8.1 g/dL   Albumin 2.9 (L) 3.5 - 5.0 g/dL   AST 13 (L) 15 - 41 U/L   ALT 8 (L) 14 - 54 U/L   Alkaline Phosphatase 65 38 - 126 U/L   Total Bilirubin 0.7 0.3 - 1.2 mg/dL   GFR calc non Af Amer 49 (L) >60 mL/min   GFR calc Af Amer 57 (L) >60 mL/min    Comment: (NOTE) The eGFR has been calculated using the CKD EPI equation. This calculation has not been validated in all clinical situations. eGFR's persistently <60 mL/min signify  possible Chronic Kidney Disease.    Anion gap 7 5 - 15  CBC     Status: Abnormal   Collection Time: 03/20/15  5:25 AM  Result Value Ref Range   WBC 10.8 (H) 4.0 - 10.5 K/uL   RBC 3.50 (L) 3.87 - 5.11 MIL/uL   Hemoglobin 10.5 (L) 12.0 - 15.0 g/dL   HCT 32.7 (L) 36.0 - 46.0 %   MCV 93.4 78.0 - 100.0 fL   MCH 30.0 26.0 - 34.0 pg   MCHC 32.1 30.0 - 36.0 g/dL   RDW 13.4 11.5 - 15.5 %   Platelets 221 150 - 400 K/uL  Hemoglobin A1c     Status: Abnormal   Collection Time: 03/20/15  5:25 AM  Result Value Ref Range   Hgb A1c MFr Bld 7.1 (H) 4.8 - 5.6 %    Comment: (NOTE)         Pre-diabetes: 5.7 - 6.4         Diabetes: >6.4         Glycemic control for adults with diabetes: <7.0    Mean Plasma Glucose 157 mg/dL    Comment: (NOTE) Performed At: [facility] [place], [place] [id number] Lindon Romp MD [id number]   Glucose, capillary     Status: Abnormal   Collection Time: 03/20/15  8:06 AM  Result Value Ref Range   Glucose-Capillary 120 (H) 65 - 99 mg/dL  Glucose, capillary     Status: Abnormal   Collection Time: 03/20/15 11:29 AM  Result Value Ref Range   Glucose-Capillary 155 (H) 65 - 99 mg/dL  Glucose, capillary     Status: Abnormal   Collection Time: 03/20/15  4:48 PM  Result Value Ref Range   Glucose-Capillary 115 (H) 65 - 99 mg/dL  Glucose, capillary     Status: Abnormal   Collection Time: 03/20/15 10:01 PM  Result Value Ref Range   Glucose-Capillary 112 (H) 65 - 99 mg/dL  Glucose, capillary     Status: Abnormal   Collection Time: 03/21/15  7:33 AM  Result Value Ref Range   Glucose-Capillary 162 (H) 65 - 99 mg/dL    Ct Soft Tissue Neck W Contrast  03/19/2015  CLINICAL DATA:  Right facial swelling.  Leukocytosis. EXAM: CT NECK WITH CONTRAST TECHNIQUE: Multidetector CT imaging of the neck  was performed using the standard protocol following the bolus administration of intravenous contrast. CONTRAST:  67m OMNIPAQUE IOHEXOL 300 MG/ML  SOLN  COMPARISON:  None. FINDINGS: Pharynx and larynx: Negative for pharyngeal mass or edema. Airway is intact. Larynx is normal. Salivary glands: Parotid gland normal bilaterally. Left submandibular gland normal. Right submandibular gland appears atrophic. Lateral to the right submandibular gland are enlarged lymph nodes with fluid-filled center. These measure 13 mm and 14 mm. There is also a 14 mm solid lymph node in this area. There is surrounding soft tissue edema and thickening of the platysmas muscle suggesting acute infection. No calculus identified. Thyroid: Left thyroid lobe normal. Surgical resection right thyroid lobe without mass lesion. Lymph nodes: Suppurative adenopathy right submandibular region measuring 13 mm and 14 mm. Solid submandibular node on the right 14 mm. These have surrounding soft tissue edema and thickening of the platysmas muscle. There is edema in the subcutaneous fat. Findings suggest acute infection and separate adenopathy. No other adenopathy. Vascular: Extensive atherosclerotic calcification in the aortic arch proximal great vessels and carotid bifurcation bilaterally. Internal carotid artery patent bilaterally. Jugular vein patent bilaterally. Left jugular vein dominant. Limited intracranial: Generalized atrophy. No acute intracranial abnormality. Visualized orbits: Negative Mastoids and visualized paranasal sinuses: Mild mucosal edema in the right maxillary sinus. No air-fluid level. Skeleton: No evidence of dental infection. 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 Marked degeneration at C3-4 with spurring. Upper chest: Lung apices are clear. IMPRESSION: Enlarged liquefied submandibular nodes on the right. In addition, there is a solid enlarged lymph node in this area. There is surrounding soft tissue edema suggesting acute infection and suppurative adenitis. This is immediately adjacent to the submandibular gland which is atrophic. No evidence of acute dental infection. These results were  called by telephone at the time of interpretation on 03/19/2015 at 12:53 pm to Dr. DDeno Etienne, who verbally acknowledged these results. Electronically Signed   By: CFranchot GalloM.D.   On: 03/19/2015 12:53    ROS Blood pressure 185/67, pulse 84, temperature 98 F (36.7 C), temperature source Oral, resp. rate 18, height 5' 4" (1.626 m), weight 71.7 kg (158 lb 1.1 oz), SpO2 95 %. Physical Exam  HENT:  Swelling is not much better but erythema has resolved. She is very tender in the right submandibular area.   Eyes: Pupils are equal, round, and reactive to light.  Cardiovascular: Normal rate.   Respiratory: Effort normal.  GI: Soft.    Assessment/Plan: Right neck abscess- discussed the situation with the family and at this point after trying to be conservative there appears a need for surgery. The family agrees and risks, benefits, and options were discussed for incision and drainage of neck abscess. All questions answered and consent obtained.   BMelissa Montane2/11/2015, 11:49 AM

## 2015-03-21 NOTE — Anesthesia Preprocedure Evaluation (Signed)
Anesthesia Evaluation  Patient identified by MRN, date of birth, ID band Patient awake    Reviewed: Allergy & Precautions, NPO status , Patient's Chart, lab work & pertinent test results  History of Anesthesia Complications (+) PONV  Airway Mallampati: III  TM Distance: >3 FB Neck ROM: Full    Dental   Pulmonary sleep apnea , pneumonia, COPD,    breath sounds clear to auscultation       Cardiovascular hypertension, + CAD   Rhythm:Regular Rate:Normal     Neuro/Psych    GI/Hepatic Neg liver ROS, PUD,   Endo/Other  diabetes  Renal/GU Renal disease     Musculoskeletal   Abdominal   Peds  Hematology   Anesthesia Other Findings   Reproductive/Obstetrics                             Anesthesia Physical Anesthesia Plan  ASA: III  Anesthesia Plan: General   Post-op Pain Management:    Induction: Intravenous, Rapid sequence and Cricoid pressure planned  Airway Management Planned: Oral ETT  Additional Equipment:   Intra-op Plan:   Post-operative Plan: Possible Post-op intubation/ventilation  Informed Consent: I have reviewed the patients History and Physical, chart, labs and discussed the procedure including the risks, benefits and alternatives for the proposed anesthesia with the patient or authorized representative who has indicated his/her understanding and acceptance.   Dental advisory given  Plan Discussed with: CRNA and Anesthesiologist  Anesthesia Plan Comments:         Anesthesia Quick Evaluation

## 2015-03-21 NOTE — Op Note (Signed)
Preop/postop diagnosis: Right neck abscess Procedure: Incision and drainage and biopsy of right neck abscess Anesthesia: Gen. Estimated blood loss: Less than 10 mL Indications: 82 year old with a swelling in the right side of her submandibular region that is fluid-filled masses that is exquisitely tender. There was significant erythema over the skin and she was admitted for intravenous antibiotics. She is still having significant pain and at this point it seem like an incision and drainage would be appropriate for these suppurative appearing nodes. She as well as the family was informed a risk and benefits of the procedure and options were discussed all questions are answered and consent was obtained. Procedure: Patient was taken to the operating room placed in the supine position after general endotracheal tube anesthesia was placed in the left gaze position the neck was prepped and draped in the usual sterile manner. An incision was made in a skin crease with a 15 blade and blunt dissection was used to dissected through the platysma muscle into the neck mass. It was open with purulent material expressed. The mass and nodal areas seem to be somewhat firm and since it was easily visualized couple of pieces of it were taken for biopsy. Both nodal areas were opened and irrigated with saline. Culture was taken. A half-inch Penrose was placed anteriorly and posteriorly into the cavities and secured with 3-0 nylon. Patient was then awakened brought to recovery room in stable condition counts correct

## 2015-03-21 NOTE — Anesthesia Procedure Notes (Signed)
Procedure Name: Intubation Date/Time: 03/21/2015 1:04 PM Performed by: Maryland Pink Pre-anesthesia Checklist: Patient identified, Emergency Drugs available, Suction available, Patient being monitored and Timeout performed Patient Re-evaluated:Patient Re-evaluated prior to inductionOxygen Delivery Method: Circle system utilized Preoxygenation: Pre-oxygenation with 100% oxygen Intubation Type: IV induction and Rapid sequence Laryngoscope Size: Glidescope and 4 Grade View: Grade II Tube type: Oral Tube size: 7.5 mm Number of attempts: 1 Airway Equipment and Method: Video-laryngoscopy Placement Confirmation: ETT inserted through vocal cords under direct vision,  positive ETCO2 and breath sounds checked- equal and bilateral Secured at: 21 cm Tube secured with: Tape Dental Injury: Teeth and Oropharynx as per pre-operative assessment

## 2015-03-21 NOTE — Progress Notes (Signed)
Pt resting in bed with eyes closed, no s/s of pain or nausea noted

## 2015-03-21 NOTE — Progress Notes (Signed)
4mg iv zofran administered for nausea 

## 2015-03-21 NOTE — Progress Notes (Signed)
PROGRESS NOTE  Lori Long [id number] DOB: October 10, 1923 DOA: 03/19/2015 PCP: Marjorie Smolder, MD  HPI/Recap of past 25 hours: 82 year old female with past medical history of diabetes, COPD and dementia who lives in a nursing facility sent over to emergency room on 2/8 by her dentist for significant swelling to her right face and neck area concerning for facial cellulitis. CT scan noted suppurative lymphadenitis. After hospitalist spoke with ENT, patient transferred from [facility] to [facility] for  I & D of right sided neck abscess.  Patient seen post surgery, feeling very rough, nauseated, throwing up. Some pain.  Assessment/Plan:  Active Problems:   Hypertension   DM II (diabetes mellitus, type II), controlled ([place]): Blood sugar stable for now, continue to monitor   COPD (chronic obstructive pulmonary disease) ([place]): Breathing stable    Dementia: Unfortunate, pain medication may make her confusion worse. On Namenda.    Suppurative lymphadenitis/Submandibular abscess: Status post I&D. Continue IV antibiotics. Awaiting cultures. Pain and nausea control   Code Status: DO NOT RESUSCITATE  Family Communication: left message for daughter   Disposition Plan: Continue to monitor, possible return back to skilled nursing on Monday    Consultants:  ENT    Proceedures  Status post I&D and biopsy of neck abscess done 2/10   Antibiotics:  IV vancomycin 2/8/-present    Objective: BP 161/82 mmHg  Pulse 119  Temp(Src) 97.9 F (36.6 C) (Oral)  Resp 16  Ht 5\' 4"  (1.626 m)  Wt 71.7 kg (158 lb 1.1 oz)  BMI 27.12 kg/m2  SpO2 99%  Intake/Output Summary (Last 24 hours) at 03/21/15 1840 Last data filed at 03/21/15 1816  Gross per 24 hour  Intake   1780 ml  Output   1765 ml  Net     15 ml   Filed Weights   03/19/15 1548 03/20/15 2303  Weight: 67.8 kg (149 lb 7.6 oz) 71.7 kg (158 lb 1.1 oz)    Exam:   General:  alert and oriented 1, moderate distress from pain and  nausea  Neck: Wrapped  Cardiovascular:  regular rate and rhythm, S1 and S2  Respiratory: clear to auscultation bilaterally   Abd: Soft, nontender, nondistended, positive bowel sounds   Musculoskeletal: No clubbing or cyanosis or edema    Data Reviewed: Basic Metabolic Panel:  Recent Labs Lab 03/19/15 1113 03/19/15 1122 03/20/15 0525  NA 131* 131* 132*  K 4.2 4.0 3.8  CL 96* 94* 100*  CO2 25  --  25  GLUCOSE 169* 168* 146*  BUN 13 12 8   CREATININE 0.90 0.80 0.99  CALCIUM 8.5*  --  7.8*   Liver Function Tests:  Recent Labs Lab 03/20/15 0525  AST 13*  ALT 8*  ALKPHOS 65  BILITOT 0.7  PROT 5.5*  ALBUMIN 2.9*   No results for input(s): LIPASE, AMYLASE in the last 168 hours. No results for input(s): AMMONIA in the last 168 hours. CBC:  Recent Labs Lab 03/19/15 1113 03/19/15 1122 03/20/15 0525  WBC 12.1*  --  10.8*  NEUTROABS 7.8*  --   --   HGB 11.5* 12.9 10.5*  HCT 35.5* 38.0 32.7*  MCV 92.7  --  93.4  PLT 255  --  221   Cardiac Enzymes:   No results for input(s): CKTOTAL, CKMB, CKMBINDEX, TROPONINI in the last 168 hours. BNP (last 3 results) No results for input(s): BNP in the last 8760 hours.  ProBNP (last 3 results) No results for input(s): PROBNP in the  last 8760 hours.  CBG:  Recent Labs Lab 03/20/15 2201 03/21/15 0733 03/21/15 1157 03/21/15 1356 03/21/15 1707  GLUCAP 112* 162* 107* 110* 154*    Recent Results (from the past 240 hour(s))  Culture, blood (routine x 2)     Status: None (Preliminary result)   Collection Time: 03/19/15  4:31 PM  Result Value Ref Range Status   Specimen Description BLOOD LEFT ARM  Final   Special Requests BOTTLES DRAWN AEROBIC AND ANAEROBIC 5CC  Final   Culture   Final    NO GROWTH 2 DAYS Performed at [facility]    Report Status PENDING  Incomplete  Culture, blood (routine x 2)     Status: None (Preliminary result)   Collection Time: 03/19/15  7:27 PM  Result Value Ref Range Status    Specimen Description BLOOD RIGHT ANTECUBITAL  Final   Special Requests AEROCOCCUS SPECIES 5 ML  Final   Culture   Final    NO GROWTH 1 DAY Performed at [facility]    Report Status PENDING  Incomplete  MRSA PCR Screening     Status: None   Collection Time: 03/19/15 10:04 PM  Result Value Ref Range Status   MRSA by PCR NEGATIVE NEGATIVE Final    Comment:        The GeneXpert MRSA Assay (FDA approved for NASAL specimens only), is one component of a comprehensive MRSA colonization surveillance program. It is not intended to diagnose MRSA infection nor to guide or monitor treatment for MRSA infections.      Studies: No results found.  Scheduled Meds: . antiseptic oral rinse  7 mL Mouth Rinse q12n4p  . brimonidine  1 drop Both Eyes Daily   And  . timolol  1 drop Both Eyes Daily  . chlorhexidine  15 mL Mouth Rinse BID  . clonazePAM  0.5 mg Oral QHS  . cloNIDine  0.1 mg Oral QHS  . docusate sodium  200 mg Oral BID  . enoxaparin (LOVENOX) injection  40 mg Subcutaneous Q24H  . gabapentin  600 mg Oral TID  . insulin aspart  0-15 Units Subcutaneous TID WC  . labetalol      . levothyroxine  50 mcg Oral QAC breakfast  . memantine  5 mg Oral BID  . metoprolol tartrate  25 mg Oral BID  . piperacillin-tazobactam (ZOSYN)  IV  3.375 g Intravenous Q8H  . pravastatin  40 mg Oral QPM  . sodium chloride flush  3 mL Intravenous Q12H  . vancomycin  500 mg Intravenous Q12H    Continuous Infusions: . lactated ringers       Time spent: 15 minutes  [facility] Pager [phone] . If 7PM-7AM, please contact night-coverage at [email or web address], password [facility] 03/21/2015, 6:40 PM  LOS: 2 days

## 2015-03-21 NOTE — Anesthesia Postprocedure Evaluation (Signed)
Anesthesia Post Note  Patient: Lori Long  Procedure(s) Performed: Procedure(s) (LRB): INCISION AND DRAINAGE ABSCESS (Right)  Patient location during evaluation: PACU Anesthesia Type: General Level of consciousness: awake Pain management: pain level controlled Vital Signs Assessment: post-procedure vital signs reviewed and stable Respiratory status: spontaneous breathing Cardiovascular status: stable Anesthetic complications: no    Last Vitals:  Filed Vitals:   03/21/15 1420 03/21/15 1430  BP:  172/77  Pulse: 88 86  Temp:    Resp: 24 19    Last Pain:  Filed Vitals:   03/21/15 1441  PainSc: Asleep                 EDWARDS,Tonika Eden

## 2015-03-21 NOTE — Transfer of Care (Signed)
Immediate Anesthesia Transfer of Care Note  Patient: Lori Long  Procedure(s) Performed: Procedure(s) with comments: INCISION AND DRAINAGE ABSCESS (Right) - Incision and drainage right submandibular abcess  Patient Location: PACU  Anesthesia Type:General  Level of Consciousness: awake and alert   Airway & Oxygen Therapy: Patient Spontanous Breathing and Patient connected to nasal cannula oxygen  Post-op Assessment: Report given to RN and Post -op Vital signs reviewed and stable  Post vital signs: Reviewed and stable  Last Vitals:  Filed Vitals:   03/21/15 0648 03/21/15 1359  BP: 185/67 160/68  Pulse:  80  Temp:  36.5 C  Resp:      Complications: No apparent anesthesia complications

## 2015-03-22 DIAGNOSIS — N179 Acute kidney failure, unspecified: Secondary | ICD-10-CM | POA: Diagnosis not present

## 2015-03-22 LAB — CREATININE, SERUM
CREATININE: 2.57 mg/dL — AB (ref 0.44–1.00)
GFR calc Af Amer: 18 mL/min — ABNORMAL LOW (ref >60)
GFR, EST NON AFRICAN AMERICAN: 15 mL/min — AB (ref >60)

## 2015-03-22 LAB — GLUCOSE, CAPILLARY
GLUCOSE-CAPILLARY: 142 mg/dL — AB (ref 65–99)
GLUCOSE-CAPILLARY: 88 mg/dL (ref 65–99)
Glucose-Capillary: 119 mg/dL — ABNORMAL HIGH (ref 65–99)
Glucose-Capillary: 116 mg/dL — ABNORMAL HIGH (ref 65–99)

## 2015-03-22 LAB — VANCOMYCIN, TROUGH: Vancomycin Tr: 16 ug/mL (ref 10.0–20.0)

## 2015-03-22 MED ORDER — ENOXAPARIN SODIUM 30 MG/0.3ML ~~LOC~~ SOLN
30.0000 mg | SUBCUTANEOUS | Status: DC
  Administered 2015-03-23 – 2015-03-24 (×2): 30 mg via SUBCUTANEOUS
  Filled 2015-03-22 (×2): qty 0.3

## 2015-03-22 MED ORDER — PIPERACILLIN-TAZOBACTAM IN DEX 2-0.25 GM/50ML IV SOLN
2.2500 g | Freq: Three times a day (TID) | INTRAVENOUS | Status: DC
  Administered 2015-03-23 – 2015-03-25 (×8): 2.25 g via INTRAVENOUS
  Filled 2015-03-22 (×12): qty 50

## 2015-03-22 MED ORDER — SODIUM CHLORIDE 0.9 % IV SOLN
INTRAVENOUS | Status: DC
  Administered 2015-03-22 – 2015-03-25 (×4): via INTRAVENOUS

## 2015-03-22 NOTE — Progress Notes (Signed)
1 Day Post-Op  Subjective: Still in a lot of pain in right submax area. Dressing intact and no excessive saturation.   Objective: Vital signs in last 24 hours: Temp:  [97.6 F (36.4 C)-99.1 F (37.3 C)] 99.1 F (37.3 C) (02/11 0510) Pulse Rate:  [77-119] 77 (02/11 0510) Resp:  [11-24] 18 (02/11 0510) BP: (126-199)/(39-86) 126/39 mmHg (02/11 0510) SpO2:  [95 %-99 %] 95 % (02/11 0510) Last BM Date: 03/20/15  Intake/Output from previous day: 02/10 0701 - 02/11 0700 In: 1303 [I.V.:1303] Out: 1165 [Urine:1150; Blood:15] Intake/Output this shift:    sleepy. she has softer area in the submental area but still seem to have same pain on palpation as before surgery.   Lab Results:   Recent Labs  03/19/15 1113 03/19/15 1122 03/20/15 0525  WBC 12.1*  --  10.8*  HGB 11.5* 12.9 10.5*  HCT 35.5* 38.0 32.7*  PLT 255  --  221   BMET  Recent Labs  03/19/15 1113 03/19/15 1122 03/20/15 0525  NA 131* 131* 132*  K 4.2 4.0 3.8  CL 96* 94* 100*  CO2 25  --  25  GLUCOSE 169* 168* 146*  BUN 13 12 8   CREATININE 0.90 0.80 0.99  CALCIUM 8.5*  --  7.8*   PT/INR No results for input(s): LABPROT, INR in the last 72 hours. ABG No results for input(s): PHART, HCO3 in the last 72 hours.  Invalid input(s): PCO2, PO2  Studies/Results: No results found.  Anti-infectives: Anti-infectives    Start     Dose/Rate Route Frequency Ordered Stop   03/20/15 0600  vancomycin (VANCOCIN) 500 mg in sodium chloride 0.9 % 100 mL IVPB     500 mg 100 mL/hr over 60 Minutes Intravenous Every 12 hours 03/19/15 1610     03/20/15 0000  piperacillin-tazobactam (ZOSYN) IVPB 3.375 g     3.375 g 12.5 mL/hr over 240 Minutes Intravenous Every 8 hours 03/19/15 1610     03/19/15 1700  vancomycin (VANCOCIN) IVPB 1000 mg/200 mL premix     1,000 mg 200 mL/hr over 60 Minutes Intravenous  Once 03/19/15 1551 03/19/15 1754   03/19/15 1630  piperacillin-tazobactam (ZOSYN) IVPB 3.375 g     3.375 g 100 mL/hr over 30  Minutes Intravenous  Once 03/19/15 1558 03/19/15 1724   03/19/15 1045  clindamycin (CLEOCIN) IVPB 900 mg     900 mg 100 mL/hr over 30 Minutes Intravenous  Once 03/19/15 1035 03/19/15 1142      Assessment/Plan: s/p Procedure(s) with comments: INCISION AND DRAINAGE ABSCESS (Right) - Incision and drainage right submandibular abcess she is still is same pain or worse compared to prior to surgery. will change dressing today and continue abx. hopefully her pain will decrease as the surgical manipulation settles. pathology and C/S pending  LOS: 3 days    Melissa Montane 03/22/2015

## 2015-03-22 NOTE — Progress Notes (Addendum)
PROGRESS NOTE  Lori Long [id number] DOB: 1923-10-06 DOA: 03/19/2015 PCP: Marjorie Smolder, MD  HPI/Recap of past 38 hours: 82 year old female with past medical history of diabetes, COPD and dementia who lives in a nursing facility sent over to emergency room on 2/8 by her dentist for significant swelling to her right face and neck area concerning for facial cellulitis. CT scan noted suppurative lymphadenitis. After hospitalist spoke with ENT, patient transferred from [facility] to [facility] for  I & D of right sided neck abscess which she underwent on the afternoon of 2/10.    Patient had rough evening postop. Pain and some nausea/vomiting. This morning, sleeping heavily.  Assessment/Plan:  Active Problems:   Hypertension: Blood pressure elevated with pain   DM II (diabetes mellitus, type II), controlled ([place]): Blood sugar stable for now, continue to monitor, patient not yet alert enough for by mouth.   COPD (chronic obstructive pulmonary disease) ([place]): Breathing stable    Dementia: Unfortunate, pain medication may make her confusion worse. On Namenda.    Suppurative lymphadenitis/Submandibular abscess: Status post I&D. Continue IV antibiotics. Awaiting cultures. Pain and nausea control. Advance diet as tolerated once more awake  Addendum: This evening, page by nursing and creatinine had gone from normal 2 days ago to 2.57. Minimal urine output all day. I've ordered increased IV fluids, urinalysis, placement of Foley catheter for strict input and output and repeat labs in the morning. Stopped her Neurontin for creatinine clearance. I'm concerned about the possibility of nephrotoxicity from vancomycin. Patient not really on any other medications over-the-counter for this. No episodes of hypotension which would lead to ATN. If creatinine no better by morning, will consult nephrology and check renal ultrasound. Bladder scan notes no urinary retention at that level.  Code Status: DO  NOT RESUSCITATE  Family Communication: Daughter at the bedside  Disposition Plan: Continue to monitor, possible return back to skilled nursing on Monday    Consultants:  ENT    Proc6edures  Status post I&D and biopsy of neck abscess done 2/10   Antibiotics:  IV vancomycin 2/8/-present    Objective: BP 148/67 mmHg  Pulse 88  Temp(Src) 98.8 F (37.1 C) (Oral)  Resp 18  Ht 5\' 4"  (1.626 m)  Wt 71.7 kg (158 lb 1.1 oz)  BMI 27.12 kg/m2  SpO2 98%  Intake/Output Summary (Last 24 hours) at 03/22/15 1606 Last data filed at 03/22/15 0800  Gross per 24 hour  Intake    503 ml  Output    950 ml  Net   -447 ml   Filed Weights   03/19/15 1548 03/20/15 2303  Weight: 67.8 kg (149 lb 7.6 oz) 71.7 kg (158 lb 1.1 oz)    Exam:   General:  Somnolent  Neck: Wrapped  Cardiovascular:  regular rate and rhythm, S1 and S2, 2/6 systolic ejection murmur  Respiratory: clear to auscultation bilaterally   Abd: Soft, nontender, nondistended, positive bowel sounds   Musculoskeletal: No clubbing or cyanosis or edema    Data Reviewed: Basic Metabolic Panel:  Recent Labs Lab 03/19/15 1113 03/19/15 1122 03/20/15 0525  NA 131* 131* 132*  K 4.2 4.0 3.8  CL 96* 94* 100*  CO2 25  --  25  GLUCOSE 169* 168* 146*  BUN 13 12 8   CREATININE 0.90 0.80 0.99  CALCIUM 8.5*  --  7.8*   Liver Function Tests:  Recent Labs Lab 03/20/15 0525  AST 13*  ALT 8*  ALKPHOS 65  BILITOT 0.7  PROT 5.5*  ALBUMIN 2.9*   No results for input(s): LIPASE, AMYLASE in the last 168 hours. No results for input(s): AMMONIA in the last 168 hours. CBC:  Recent Labs Lab 03/19/15 1113 03/19/15 1122 03/20/15 0525  WBC 12.1*  --  10.8*  NEUTROABS 7.8*  --   --   HGB 11.5* 12.9 10.5*  HCT 35.5* 38.0 32.7*  MCV 92.7  --  93.4  PLT 255  --  221   Cardiac Enzymes:   No results for input(s): CKTOTAL, CKMB, CKMBINDEX, TROPONINI in the last 168 hours. BNP (last 3 results) No results for input(s): BNP  in the last 8760 hours.  ProBNP (last 3 results) No results for input(s): PROBNP in the last 8760 hours.  CBG:  Recent Labs Lab 03/21/15 1356 03/21/15 1707 03/21/15 2153 03/22/15 0755 03/22/15 1415  GLUCAP 110* 154* 169* 119* 142*    Recent Results (from the past 240 hour(s))  Culture, blood (routine x 2)     Status: None (Preliminary result)   Collection Time: 03/19/15  4:31 PM  Result Value Ref Range Status   Specimen Description BLOOD LEFT ARM  Final   Special Requests BOTTLES DRAWN AEROBIC AND ANAEROBIC 5CC  Final   Culture   Final    NO GROWTH 3 DAYS Performed at [facility]    Report Status PENDING  Incomplete  Culture, blood (routine x 2)     Status: None (Preliminary result)   Collection Time: 03/19/15  7:27 PM  Result Value Ref Range Status   Specimen Description BLOOD RIGHT ANTECUBITAL  Final   Special Requests AEROCOCCUS SPECIES 5 ML  Final   Culture   Final    NO GROWTH 2 DAYS Performed at [facility]    Report Status PENDING  Incomplete  MRSA PCR Screening     Status: None   Collection Time: 03/19/15 10:04 PM  Result Value Ref Range Status   MRSA by PCR NEGATIVE NEGATIVE Final    Comment:        The GeneXpert MRSA Assay (FDA approved for NASAL specimens only), is one component of a comprehensive MRSA colonization surveillance program. It is not intended to diagnose MRSA infection nor to guide or monitor treatment for MRSA infections.      Studies: No results found.  Scheduled Meds: . antiseptic oral rinse  7 mL Mouth Rinse q12n4p  . brimonidine  1 drop Both Eyes Daily   And  . timolol  1 drop Both Eyes Daily  . chlorhexidine  15 mL Mouth Rinse BID  . clonazePAM  0.5 mg Oral QHS  . cloNIDine  0.1 mg Oral QHS  . docusate sodium  200 mg Oral BID  . enoxaparin (LOVENOX) injection  40 mg Subcutaneous Q24H  . gabapentin  600 mg Oral TID  . insulin aspart  0-15 Units Subcutaneous TID WC  . levothyroxine  50 mcg Oral QAC  breakfast  . memantine  5 mg Oral BID  . metoprolol tartrate  25 mg Oral BID  . piperacillin-tazobactam (ZOSYN)  IV  3.375 g Intravenous Q8H  . pravastatin  40 mg Oral QPM  . sodium chloride flush  3 mL Intravenous Q12H  . vancomycin  500 mg Intravenous Q12H    Continuous Infusions: . sodium chloride 75 mL/hr at 03/22/15 0807  . lactated ringers       Time spent: 25 minutes  [facility] Pager [phone] . If 7PM-7AM, please contact night-coverage at [email or web address],  password [email or web address] 03/22/2015, 4:06 PM  LOS: 3 days

## 2015-03-22 NOTE — Progress Notes (Addendum)
Pharmacy Antibiotic Note  Lori Long is a 82 y.o. female NH resident sent from the dentist on 03/19/2015 with neck swelling, adenitis, cellulitis. VT = 16 mcg/mL this evening  Plan: Continue Vancomycin 500mg  IV every q12h hours.  Goal trough 15-20 mcg/mL. Zosyn 3.375g IV q8h (4 hour infusion). Measure Vanc trough at steady state. Follow up renal fxn, culture results, and clinical course.  Height: 5\' 4"  (162.6 cm) Weight: 158 lb 1.1 oz (71.7 kg) IBW/kg (Calculated) : 54.7  Temp (24hrs), Avg:98.5 F (36.9 C), Min:97.9 F (36.6 C), Max:99.1 F (37.3 C)   Recent Labs Lab 03/19/15 1113 03/19/15 1122 03/19/15 1631 03/20/15 0525 03/22/15 1712  WBC 12.1*  --   --  10.8*  --   CREATININE 0.90 0.80  --  0.99 2.57*  LATICACIDVEN  --   --  0.8  --   --   VANCOTROUGH  --   --   --   --  16    Estimated Creatinine Clearance: 14.4 mL/min (by C-G formula based on Cr of 2.57).    Allergies  Allergen Reactions  . Clonidine Derivatives     Local reaction to patch.  . Ezetimibe-Simvastatin Other (See Comments)    Leg pain from Vytorin  . Lipitor [Atorvastatin Calcium] Other (See Comments)    Couldn't tolerate  . Metformin And Related Diarrhea  . Morphine And Related Other (See Comments)    hallucination  . Codeine Rash    Antimicrobials this admission: 2/8 >> Vanc >>  2/8 >> Zosyn >>  Dose adjustments this admission: VT 2/11 = 16  Microbiology results: Pending  Levester Fresh, PharmD, BCPS, [facility] Clinical Pharmacist Pager [phone] 03/22/2015 6:24 PM   Addendum (2/11 2240)  Will change Zosyn to 2.25gm IV q8h with CrCl 14 ml/min.  Sherlon Handing, PharmD, BCPS Clinical pharmacist, pager [phone] 03/22/2015 11:38 PM

## 2015-03-23 LAB — URINALYSIS, ROUTINE W REFLEX MICROSCOPIC
Glucose, UA: NEGATIVE mg/dL
KETONES UR: 15 mg/dL — AB
LEUKOCYTES UA: NEGATIVE
NITRITE: NEGATIVE
PH: 5 (ref 5.0–8.0)
PROTEIN: 100 mg/dL — AB
Specific Gravity, Urine: 1.029 (ref 1.005–1.030)

## 2015-03-23 LAB — GLUCOSE, CAPILLARY
GLUCOSE-CAPILLARY: 113 mg/dL — AB (ref 65–99)
GLUCOSE-CAPILLARY: 114 mg/dL — AB (ref 65–99)
Glucose-Capillary: 144 mg/dL — ABNORMAL HIGH (ref 65–99)
Glucose-Capillary: 106 mg/dL — ABNORMAL HIGH (ref 65–99)

## 2015-03-23 LAB — URINE MICROSCOPIC-ADD ON

## 2015-03-23 LAB — BASIC METABOLIC PANEL
ANION GAP: 13 (ref 5–15)
BUN: 10 mg/dL (ref 6–20)
CALCIUM: 7.8 mg/dL — AB (ref 8.9–10.3)
CO2: 24 mmol/L (ref 22–32)
CREATININE: 2.38 mg/dL — AB (ref 0.44–1.00)
Chloride: 103 mmol/L (ref 101–111)
GFR, EST AFRICAN AMERICAN: 20 mL/min — AB (ref >60)
GFR, EST NON AFRICAN AMERICAN: 17 mL/min — AB (ref >60)
Glucose, Bld: 126 mg/dL — ABNORMAL HIGH (ref 65–99)
Potassium: 3.3 mmol/L — ABNORMAL LOW (ref 3.5–5.1)
SODIUM: 140 mmol/L (ref 135–145)

## 2015-03-23 LAB — CBC
HCT: 32.1 % — ABNORMAL LOW (ref 36.0–46.0)
Hemoglobin: 10 g/dL — ABNORMAL LOW (ref 12.0–15.0)
MCH: 28.9 pg (ref 26.0–34.0)
MCHC: 31.2 g/dL (ref 30.0–36.0)
MCV: 92.8 fL (ref 78.0–100.0)
PLATELETS: 240 10*3/uL (ref 150–400)
RBC: 3.46 MIL/uL — ABNORMAL LOW (ref 3.87–5.11)
RDW: 13.3 % (ref 11.5–15.5)
WBC: 9.8 10*3/uL (ref 4.0–10.5)

## 2015-03-23 MED ORDER — TRIAMCINOLONE ACETONIDE 0.025 % EX CREA
TOPICAL_CREAM | Freq: Two times a day (BID) | CUTANEOUS | Status: DC | PRN
  Administered 2015-03-23 – 2015-03-25 (×2): via TOPICAL
  Filled 2015-03-23 (×2): qty 15

## 2015-03-23 MED ORDER — SODIUM CHLORIDE 0.9 % IV SOLN
500.0000 mg | Freq: Two times a day (BID) | INTRAVENOUS | Status: DC
  Administered 2015-03-23 – 2015-03-25 (×4): 500 mg via INTRAVENOUS
  Filled 2015-03-23 (×6): qty 500

## 2015-03-23 NOTE — Progress Notes (Signed)
PROGRESS NOTE  Lori Long [id number] DOB: 06/01/1923 DOA: 03/19/2015 PCP: Marjorie Smolder, MD  HPI/Recap of past 15 hours: 82 year old female with past medical history of diabetes, COPD and dementia who lives in a nursing facility sent over to emergency room on 2/8 by her dentist for significant swelling to her right face and neck area concerning for facial cellulitis. CT scan noted suppurative lymphadenitis. After hospitalist spoke with ENT, patient transferred from [facility] to [facility] for  I & D of right sided neck abscess which she underwent on the afternoon of 2/10.    Following surgery, patient is a rough course with poor by mouth intake and neck pain. Continued on IV antibiotics with cultures still pending. On evening of 2/11, creatinine came back elevated at 2.9. Previous creatinine normal. Patient started on IV fluids and urinalysis noted granular casts. Follow-up creatinine on 2/12 slightly improved. Patient herself is very somnolent and according to daughter with poor by mouth intake.  Assessment/Plan:  Active Problems:   Hypertension: Blood pressure initially elevated with pain, improving   DM II (diabetes mellitus, type II), controlled ([place]): Blood sugar stable for now, continue to monitor, noted poor by mouth intake   COPD (chronic obstructive pulmonary disease) ([place]): Breathing stable    Dementia: Unfortunate, pain medication may make her confusion worse. On Namenda.    Suppurative lymphadenitis/Submandibular abscess: Status post I&D. Continue IV antibiotics. Awaiting cultures. Pain and nausea control. Advance diet as she can tolerate  Acute kidney injury: Possibly vancomycin nephrotoxicity versus hypotension from poor by mouth intake:?  Catheter placed for strict input/output. Stop Neurontin because of creatinine clearance. Urinalysis notes some granular casts. Continue IV fluids and recheck level. Creatinine worse, will ask nephrology to see.   Code Status: DO  NOT RESUSCITATE  Family Communication: Daughter at the bedside  Disposition Plan: Likely here for several more days until creatinine improving   Consultants:  ENT    Procedures  Status post I&D and biopsy of neck abscess done 2/10   Antibiotics:  IV vancomycin 2/8/-present    Objective: BP 130/61 mmHg  Pulse 86  Temp(Src) 98 F (36.7 C) (Oral)  Resp 16  Ht 5\' 4"  (1.626 m)  Wt 71.7 kg (158 lb 1.1 oz)  BMI 27.12 kg/m2  SpO2 95%  Intake/Output Summary (Last 24 hours) at 03/23/15 1607 Last data filed at 03/23/15 0900  Gross per 24 hour  Intake 1394.67 ml  Output    345 ml  Net 1049.67 ml   Filed Weights   03/19/15 1548 03/20/15 2303  Weight: 67.8 kg (149 lb 7.6 oz) 71.7 kg (158 lb 1.1 oz)    Exam: Little change from previous day  General:  Somnolent  Neck: Wrapped  Cardiovascular:  regular rate and rhythm, S1 and S2, 2/6 systolic ejection murmur  Respiratory: clear to auscultation bilaterally   Abd: Soft, nontender, nondistended, positive bowel sounds   Musculoskeletal: No clubbing or cyanosis or edema    Data Reviewed: Basic Metabolic Panel:  Recent Labs Lab 03/19/15 1113 03/19/15 1122 03/20/15 0525 03/22/15 1712 03/23/15 0545  NA 131* 131* 132*  --  140  K 4.2 4.0 3.8  --  3.3*  CL 96* 94* 100*  --  103  CO2 25  --  25  --  24  GLUCOSE 169* 168* 146*  --  126*  BUN 13 12 8   --  10  CREATININE 0.90 0.80 0.99 2.57* 2.38*  CALCIUM 8.5*  --  7.8*  --  7.8*   Liver Function Tests:  Recent Labs Lab 03/20/15 0525  AST 13*  ALT 8*  ALKPHOS 65  BILITOT 0.7  PROT 5.5*  ALBUMIN 2.9*   No results for input(s): LIPASE, AMYLASE in the last 168 hours. No results for input(s): AMMONIA in the last 168 hours. CBC:  Recent Labs Lab 03/19/15 1113 03/19/15 1122 03/20/15 0525 03/23/15 0545  WBC 12.1*  --  10.8* 9.8  NEUTROABS 7.8*  --   --   --   HGB 11.5* 12.9 10.5* 10.0*  HCT 35.5* 38.0 32.7* 32.1*  MCV 92.7  --  93.4 92.8  PLT 255   --  221 240   Cardiac Enzymes:   No results for input(s): CKTOTAL, CKMB, CKMBINDEX, TROPONINI in the last 168 hours. BNP (last 3 results) No results for input(s): BNP in the last 8760 hours.  ProBNP (last 3 results) No results for input(s): PROBNP in the last 8760 hours.  CBG:  Recent Labs Lab 03/22/15 1415 03/22/15 1736 03/22/15 2112 03/23/15 0806 03/23/15 1247  GLUCAP 142* 88 116* 113* 144*    Recent Results (from the past 240 hour(s))  Culture, blood (routine x 2)     Status: None (Preliminary result)   Collection Time: 03/19/15  4:31 PM  Result Value Ref Range Status   Specimen Description BLOOD LEFT ARM  Final   Special Requests BOTTLES DRAWN AEROBIC AND ANAEROBIC 5CC  Final   Culture   Final    NO GROWTH 4 DAYS Performed at [facility]    Report Status PENDING  Incomplete  Culture, blood (routine x 2)     Status: None (Preliminary result)   Collection Time: 03/19/15  7:27 PM  Result Value Ref Range Status   Specimen Description BLOOD RIGHT ANTECUBITAL  Final   Special Requests AEROCOCCUS SPECIES 5 ML  Final   Culture   Final    NO GROWTH 3 DAYS Performed at [facility]    Report Status PENDING  Incomplete  MRSA PCR Screening     Status: None   Collection Time: 03/19/15 10:04 PM  Result Value Ref Range Status   MRSA by PCR NEGATIVE NEGATIVE Final    Comment:        The GeneXpert MRSA Assay (FDA approved for NASAL specimens only), is one component of a comprehensive MRSA colonization surveillance program. It is not intended to diagnose MRSA infection nor to guide or monitor treatment for MRSA infections.      Studies: No results found.  Scheduled Meds: . antiseptic oral rinse  7 mL Mouth Rinse q12n4p  . brimonidine  1 drop Both Eyes Daily   And  . timolol  1 drop Both Eyes Daily  . chlorhexidine  15 mL Mouth Rinse BID  . clonazePAM  0.5 mg Oral QHS  . cloNIDine  0.1 mg Oral QHS  . docusate sodium  200 mg Oral BID  .  enoxaparin (LOVENOX) injection  30 mg Subcutaneous Q24H  . insulin aspart  0-15 Units Subcutaneous TID WC  . levothyroxine  50 mcg Oral QAC breakfast  . memantine  5 mg Oral BID  . metoprolol tartrate  25 mg Oral BID  . piperacillin-tazobactam (ZOSYN)  IV  2.25 g Intravenous Q8H  . pravastatin  40 mg Oral QPM  . sodium chloride flush  3 mL Intravenous Q12H  . vancomycin  500 mg Intravenous Q12H    Continuous Infusions: . sodium chloride 100 mL/hr at 03/22/15 1905  Time spent: 25 minutes  [facility] Pager [phone] . If 7PM-7AM, please contact night-coverage at [email or web address], password [email or web address] 03/23/2015, 4:07 PM  LOS: 4 days

## 2015-03-23 NOTE — Progress Notes (Signed)
2 Days Post-Op  Subjective: Doing much better. She now does not hurt unless the area is palpated. Swelling better  Objective: Vital signs in last 24 hours: Temp:  [98 F (36.7 C)-98.8 F (37.1 C)] 98 F (36.7 C) (02/12 0434) Pulse Rate:  [87-95] 87 (02/12 0434) Resp:  [16-18] 16 (02/12 0434) BP: (121-148)/(48-67) 134/66 mmHg (02/12 0434) SpO2:  [90 %-98 %] 96 % (02/12 0434) Last BM Date:  (unknown , pt has dementia)  Intake/Output from previous day: 02/11 0701 - 02/12 0700 In: 1394.7 [P.O.:240; I.V.:1154.7] Out: 345 [Urine:225; Stool:120] Intake/Output this shift:    awake and much more alert today. the wound is still swollen but erythema resolved and pain much better. one of the drains removed. no oc/op swelling  Lab Results:   Recent Labs  03/23/15 0545  WBC 9.8  HGB 10.0*  HCT 32.1*  PLT 240   BMET  Recent Labs  03/22/15 1712 03/23/15 0545  NA  --  140  K  --  3.3*  CL  --  103  CO2  --  24  GLUCOSE  --  126*  BUN  --  10  CREATININE 2.57* 2.38*  CALCIUM  --  7.8*   PT/INR No results for input(s): LABPROT, INR in the last 72 hours. ABG No results for input(s): PHART, HCO3 in the last 72 hours.  Invalid input(s): PCO2, PO2  Studies/Results: No results found.  Anti-infectives: Anti-infectives    Start     Dose/Rate Route Frequency Ordered Stop   03/23/15 1800  vancomycin (VANCOCIN) 500 mg in sodium chloride 0.9 % 100 mL IVPB     500 mg 100 mL/hr over 60 Minutes Intravenous Every 12 hours 03/23/15 1011     03/23/15 0000  piperacillin-tazobactam (ZOSYN) IVPB 2.25 g     2.25 g 100 mL/hr over 30 Minutes Intravenous Every 8 hours 03/22/15 2339     03/20/15 0600  vancomycin (VANCOCIN) 500 mg in sodium chloride 0.9 % 100 mL IVPB  Status:  Discontinued     500 mg 100 mL/hr over 60 Minutes Intravenous Every 12 hours 03/19/15 1610 03/23/15 1002   03/20/15 0000  piperacillin-tazobactam (ZOSYN) IVPB 3.375 g  Status:  Discontinued     3.375 g 12.5 mL/hr  over 240 Minutes Intravenous Every 8 hours 03/19/15 1610 03/22/15 2339   03/19/15 1700  vancomycin (VANCOCIN) IVPB 1000 mg/200 mL premix     1,000 mg 200 mL/hr over 60 Minutes Intravenous  Once 03/19/15 1551 03/19/15 1754   03/19/15 1630  piperacillin-tazobactam (ZOSYN) IVPB 3.375 g     3.375 g 100 mL/hr over 30 Minutes Intravenous  Once 03/19/15 1558 03/19/15 1724   03/19/15 1045  clindamycin (CLEOCIN) IVPB 900 mg     900 mg 100 mL/hr over 30 Minutes Intravenous  Once 03/19/15 1035 03/19/15 1142      Assessment/Plan: s/p Procedure(s) with comments: INCISION AND DRAINAGE ABSCESS (Right) - Incision and drainage right submandibular abcess she is improved. I do not see the path nor c/s listed in Epic. It was sent from OR so will need to check tomorrow if received. She will need to be discharged to nursing home and would be more difficult to get her back to office for second drain removal so will keep her here for IV abx and drain care  LOS: 4 days    Melissa Montane 03/23/2015

## 2015-03-24 ENCOUNTER — Encounter (HOSPITAL_COMMUNITY): Payer: Self-pay | Admitting: Otolaryngology

## 2015-03-24 LAB — BASIC METABOLIC PANEL
ANION GAP: 7 (ref 5–15)
BUN: 12 mg/dL (ref 6–20)
CALCIUM: 8.1 mg/dL — AB (ref 8.9–10.3)
CHLORIDE: 109 mmol/L (ref 101–111)
CO2: 22 mmol/L (ref 22–32)
Creatinine, Ser: 1.79 mg/dL — ABNORMAL HIGH (ref 0.44–1.00)
GFR calc Af Amer: 28 mL/min — ABNORMAL LOW (ref >60)
GFR calc non Af Amer: 24 mL/min — ABNORMAL LOW (ref >60)
GLUCOSE: 130 mg/dL — AB (ref 65–99)
Potassium: 3.4 mmol/L — ABNORMAL LOW (ref 3.5–5.1)
Sodium: 138 mmol/L (ref 135–145)

## 2015-03-24 LAB — GLUCOSE, CAPILLARY
GLUCOSE-CAPILLARY: 122 mg/dL — AB (ref 65–99)
GLUCOSE-CAPILLARY: 128 mg/dL — AB (ref 65–99)
Glucose-Capillary: 132 mg/dL — ABNORMAL HIGH (ref 65–99)
Glucose-Capillary: 150 mg/dL — ABNORMAL HIGH (ref 65–99)

## 2015-03-24 LAB — CULTURE, BLOOD (ROUTINE X 2): CULTURE: NO GROWTH

## 2015-03-24 MED ORDER — LORAZEPAM 2 MG/ML IJ SOLN
0.5000 mg | Freq: Four times a day (QID) | INTRAMUSCULAR | Status: DC | PRN
  Administered 2015-03-24: 0.5 mg via INTRAVENOUS

## 2015-03-24 MED ORDER — LORAZEPAM 2 MG/ML IJ SOLN
INTRAMUSCULAR | Status: AC
  Filled 2015-03-24: qty 1

## 2015-03-24 MED ORDER — GLUCERNA SHAKE PO LIQD
237.0000 mL | Freq: Three times a day (TID) | ORAL | Status: DC
  Administered 2015-03-24: 237 mL via ORAL

## 2015-03-24 NOTE — Progress Notes (Addendum)
Initial Nutrition Assessment  DOCUMENTATION CODES:   Not applicable  INTERVENTION:   -Glucerna Shake po TID, each supplement provides 220 kcal and 10 grams of protein  NUTRITION DIAGNOSIS:   Inadequate oral intake related to poor appetite as evidenced by meal completion < 50%.  GOAL:   Patient will meet greater than or equal to 90% of their needs  MONITOR:   PO intake, Supplement acceptance, Labs, Weight trends, Skin, I & O's  REASON FOR ASSESSMENT:   Consult Assessment of nutrition requirement/status  ASSESSMENT:   This is a 82 year old Caucasian female with past medical history of diabetes, dementia, hard of hearing with hearing aids, hypertension, who presents with complaints of pain and swelling in the right face and neck area. She has evidence for facial cellulitis. CT scan shows suppurative lymphadenitis.  Pt admitted with rt neck/facial cellulitis with suppurative lymphadenitis.   Pt transferred from [facility] to [facility] on 03/20/15. PTA, pt was a resident of [facility].  S/p Procedure on 03/21/15:  Incision and drainage and biopsy of right neck abscess  Pt daughter outside of room at time of visit; reports that pt has been vomiting and attempting to pull out her IV. Unable to complete Nutrition-Focused physical exam at this time.   Pt currently on a carb modified diet; noted poor intake (PO: 0-35%). Pt would benefit from the addition of supplements to promote healing.   Wt hx reviewed. UBW around 155#. Noted wt stability over the past year.   Labs reviewed: K: 3.4, CBGS: 106-132. Last Hgb A1c: 7.1 (03/20/15), which indicates optimal control given pt's advanced age.   Diet Order:  Diet Carb Modified Fluid consistency:: Thin; Room service appropriate?: Yes  Skin:  Wound (see comment) (closed rt face incision)  Last BM:  03/24/15  Height:   Ht Readings from Last 1 Encounters:  03/20/15 5\' 4"  (1.626 m)    Weight:   Wt Readings from Last 1  Encounters:  03/20/15 158 lb 1.1 oz (71.7 kg)    Ideal Body Weight:  54.5 kg  BMI:  Body mass index is 27.12 kg/(m^2).  Estimated Nutritional Needs:   Kcal:  1550-1750  Protein:  75-90 grams  Fluid:  1.5-1.7 L  EDUCATION NEEDS:   No education needs identified at this time  Kristinia Leavy A. Jimmye Norman, RD, LDN, CDE Pager: [phone] After hours Pager: [phone]

## 2015-03-24 NOTE — Progress Notes (Signed)
PROGRESS NOTE  Lori Long [id number] DOB: 02-18-1923 DOA: 03/19/2015 PCP: Marjorie Smolder, MD  HPI/Recap of past 24 hours: 82 year old female with past medical history of diabetes, COPD and dementia who lives in a nursing facility sent over to emergency room on 2/8 by her dentist for significant swelling to her right face and neck area concerning for facial cellulitis. CT scan noted suppurative lymphadenitis. After hospitalist spoke with ENT, patient transferred from [facility] to [facility] for  I & D of right sided neck abscess which she underwent on the afternoon of 2/10.    Following surgery, patient is a rough course with poor by mouth intake and neck pain. Continued on IV antibiotics with cultures still pending. On evening of 2/11, creatinine came back elevated at 2.9. Previous creatinine normal. Patient started on IV fluids and urinalysis noted granular casts. Creatinine has started to trend downward and today is at 1.79. Patient herself more awake, once to go home. Some mild pain. Not much appetite.  Assessment/Plan:  Active Problems:   Hypertension: Blood pressure initially elevated with pain, improving   DM II (diabetes mellitus, type II), controlled ([place]): Blood sugar stable for now, continue to monitor, noted poor by mouth intake. Have asked nutrition to help with poor by mouth intake and they have started her on Glucerna 3 times a day   COPD (chronic obstructive pulmonary disease) ([place]): Breathing stable    Dementia: Unfortunate, pain medication may make her confusion worse. On Namenda.    Suppurative lymphadenitis/Submandibular abscess: Status post I&D. Continue IV antibiotics. Awaiting cultures. Pain and nausea control. Advance diet as she can tolerate  Acute kidney injury: Possibly vancomycin nephrotoxicity versus hypotension from poor by mouth intake:?  Catheter placed for strict input/output. Stop Neurontin because of creatinine clearance. Urinalysis notes some  granular casts. Responding well to IV fluids  Disposition Plan: Likely here for several more days until creatinine improving   Consultants:  ENT    Procedures  Status post I&D and biopsy of neck abscess done 2/10   Antibiotics:  IV vancomycin 2/8/-present    Objective: BP 128/60 mmHg  Pulse 79  Temp(Src) 97.9 F (36.6 C) (Axillary)  Resp 17  Ht 5\' 4"  (1.626 m)  Wt 71.7 kg (158 lb 1.1 oz)  BMI 27.12 kg/m2  SpO2 92%  Intake/Output Summary (Last 24 hours) at 03/24/15 1458 Last data filed at 03/24/15 1037  Gross per 24 hour  Intake 1386.33 ml  Output   1275 ml  Net 111.33 ml   Filed Weights   03/19/15 1548 03/20/15 2303  Weight: 67.8 kg (149 lb 7.6 oz) 71.7 kg (158 lb 1.1 oz)    Exam:   General:  Awake and alert, oriented 2   Neck: Wrapped  Cardiovascular:  regular rate and rhythm, S1 and S2, 2/6 systolic ejection murmur  Respiratory: clear to auscultation bilaterally   Abd: Soft, nontender, nondistended, positive bowel sounds   Musculoskeletal: No clubbing or cyanosis or edema    Data Reviewed: Basic Metabolic Panel:  Recent Labs Lab 03/19/15 1113 03/19/15 1122 03/20/15 0525 03/22/15 1712 03/23/15 0545 03/24/15 0540  NA 131* 131* 132*  --  140 138  K 4.2 4.0 3.8  --  3.3* 3.4*  CL 96* 94* 100*  --  103 109  CO2 25  --  25  --  24 22  GLUCOSE 169* 168* 146*  --  126* 130*  BUN 13 12 8   --  10 12  CREATININE 0.90 0.80  0.99 2.57* 2.38* 1.79*  CALCIUM 8.5*  --  7.8*  --  7.8* 8.1*   Liver Function Tests:  Recent Labs Lab 03/20/15 0525  AST 13*  ALT 8*  ALKPHOS 65  BILITOT 0.7  PROT 5.5*  ALBUMIN 2.9*   No results for input(s): LIPASE, AMYLASE in the last 168 hours. No results for input(s): AMMONIA in the last 168 hours. CBC:  Recent Labs Lab 03/19/15 1113 03/19/15 1122 03/20/15 0525 03/23/15 0545  WBC 12.1*  --  10.8* 9.8  NEUTROABS 7.8*  --   --   --   HGB 11.5* 12.9 10.5* 10.0*  HCT 35.5* 38.0 32.7* 32.1*  MCV 92.7  --   93.4 92.8  PLT 255  --  221 240   Cardiac Enzymes:   No results for input(s): CKTOTAL, CKMB, CKMBINDEX, TROPONINI in the last 168 hours. BNP (last 3 results) No results for input(s): BNP in the last 8760 hours.  ProBNP (last 3 results) No results for input(s): PROBNP in the last 8760 hours.  CBG:  Recent Labs Lab 03/23/15 1247 03/23/15 1659 03/23/15 2209 03/24/15 0801 03/24/15 1248  GLUCAP 144* 114* 106* 122* 132*    Recent Results (from the past 240 hour(s))  Culture, blood (routine x 2)     Status: None   Collection Time: 03/19/15  4:31 PM  Result Value Ref Range Status   Specimen Description BLOOD LEFT ARM  Final   Special Requests BOTTLES DRAWN AEROBIC AND ANAEROBIC 5CC  Final   Culture   Final    NO GROWTH 5 DAYS Performed at [facility]    Report Status 03/24/2015 FINAL  Final  Culture, blood (routine x 2)     Status: None (Preliminary result)   Collection Time: 03/19/15  7:27 PM  Result Value Ref Range Status   Specimen Description BLOOD RIGHT ANTECUBITAL  Final   Special Requests AEROCOCCUS SPECIES 5 ML  Final   Culture   Final    NO GROWTH 4 DAYS Performed at [facility]    Report Status PENDING  Incomplete  MRSA PCR Screening     Status: None   Collection Time: 03/19/15 10:04 PM  Result Value Ref Range Status   MRSA by PCR NEGATIVE NEGATIVE Final    Comment:        The GeneXpert MRSA Assay (FDA approved for NASAL specimens only), is one component of a comprehensive MRSA colonization surveillance program. It is not intended to diagnose MRSA infection nor to guide or monitor treatment for MRSA infections.      Studies: No results found.  Scheduled Meds: . antiseptic oral rinse  7 mL Mouth Rinse q12n4p  . brimonidine  1 drop Both Eyes Daily   And  . timolol  1 drop Both Eyes Daily  . chlorhexidine  15 mL Mouth Rinse BID  . clonazePAM  0.5 mg Oral QHS  . cloNIDine  0.1 mg Oral QHS  . docusate sodium  200 mg Oral BID  .  enoxaparin (LOVENOX) injection  30 mg Subcutaneous Q24H  . feeding supplement (GLUCERNA SHAKE)  237 mL Oral TID BM  . insulin aspart  0-15 Units Subcutaneous TID WC  . levothyroxine  50 mcg Oral QAC breakfast  . memantine  5 mg Oral BID  . metoprolol tartrate  25 mg Oral BID  . piperacillin-tazobactam (ZOSYN)  IV  2.25 g Intravenous Q8H  . pravastatin  40 mg Oral QPM  . sodium chloride flush  3 mL  Intravenous Q12H  . vancomycin  500 mg Intravenous Q12H    Continuous Infusions: . sodium chloride 100 mL/hr at 03/23/15 1818     Time spent: 15 minutes  [facility] Pager [phone] . If 7PM-7AM, please contact night-coverage at [email or web address], password [facility] 03/24/2015, 2:58 PM  LOS: 5 days

## 2015-03-24 NOTE — Progress Notes (Signed)
Physical Therapy Treatment Patient Details Name: Lori Long MRN: [id number] DOB: June 06, 1923 Today's Date: 03/24/2015    History of Present Illness 82 yo female admitted with neck infection, submandibular abscess. Hx of falls, DM, dementia, HTN, macular degeneration, COPD, back pain, osteopenia.     PT Comments    Pt with increased confusion today which is limiting her ability to mobilize safely. She got up OOB with mod one person hand held assist and proceeded to sit on the trash can which she thought was a commode to try to urinate (despite repeated cues and visually showing the pt the foley catheter bag).  PT did not feel safe ambulating this pt to the hallway without a second set of hands and RW no longer in room. PT will continue to follow acutely and mobilize as able.  Pt is much more appropriate for SNF level rehab in her current presentation today.    Follow Up Recommendations  SNF;Supervision/Assistance - 24 hour     Equipment Recommendations  None recommended by PT    Recommendations for Other Services   NA     Precautions / Restrictions Precautions Precautions: Fall    Mobility  Bed Mobility Overal bed mobility: Needs Assistance Bed Mobility: Supine to Sit;Sit to Supine     Supine to sit: Min assist Sit to supine: Min assist   General bed mobility comments: Min assist to support trunk to get EOB and to lift feet back into the bed when going back to supine.   Transfers Overall transfer level: Needs assistance   Transfers: Sit to/from Stand Sit to Stand: Mod assist         General transfer comment: Mod assist to support trunk during transitions. Pt more unsteady than in last described notes.  I think some of this is her increased confusion and decreased ability to motor plan and problem solve.   Ambulation/Gait Ambulation/Gait assistance: Mod assist Ambulation Distance (Feet): 5 Feet Assistive device: 1 person hand held assist Gait Pattern/deviations:  Step-through pattern;Staggering left;Staggering right     General Gait Details: Mod one person hand held assist to support trunk for balance.  Pt needing verbal cues and manual assist to make it back to safe sitting surface (the bed) vs unsafe sitting surface (the trash can).            Balance Overall balance assessment: Needs assistance Sitting-balance support: Feet supported;Bilateral upper extremity supported Sitting balance-Leahy Scale: Poor     Standing balance support: Single extremity supported Standing balance-Leahy Scale: Poor                      Cognition Arousal/Alertness: Lethargic Behavior During Therapy: WFL for tasks assessed/performed Overall Cognitive Status: Impaired/Different from baseline Area of Impairment: Orientation;Memory;Attention;Following commands;Safety/judgement;Awareness;Problem solving   Current Attention Level: Focused Memory: Decreased short-term memory Following Commands: Follows one step commands inconsistently Safety/Judgement: Decreased awareness of safety;Decreased awareness of deficits Awareness: Intellectual Problem Solving: Slow processing;Decreased initiation;Difficulty sequencing;Requires verbal cues;Requires tactile cues General Comments: Pt sat on the trash can thinking it was the bedside commode and could not recognize that she had a foley catheter collecting her urine.            Pertinent Vitals/Pain Pain Assessment: Faces Faces Pain Scale: Hurts little more Pain Location: generalized Pain Descriptors / Indicators: Grimacing Pain Intervention(s): Limited activity within patient's tolerance;Monitored during session;Premedicated before session;Repositioned           PT Goals (current goals can now be found in  the care plan section) Acute Rehab PT Goals Patient Stated Goal: unable to state Progress towards PT goals: Not progressing toward goals - comment (increased confusion this PM)    Frequency  Min  3X/week    PT Plan Current plan remains appropriate       End of Session   Activity Tolerance: Patient limited by lethargy Patient left: in bed;with call bell/phone within reach;with bed alarm set     Time: [id number] PT Time Calculation (min) (ACUTE ONLY): 11 min  Charges:  $Therapeutic Activity: 8-22 mins                      Barry Culverhouse B. [place], [place], DPT [phone]   03/24/2015, 3:52 PM

## 2015-03-24 NOTE — Progress Notes (Signed)
I tried to put peripheral IV 2x but unsuccessful consult IV team.

## 2015-03-24 NOTE — Progress Notes (Signed)
3 Days Post-Op  Subjective: She is feeling much better. The pain is substantially decreased. She is asking to go home.  Objective: Vital signs in last 24 hours: Temp:  [97.9 F (36.6 C)-98.3 F (36.8 C)] 97.9 F (36.6 C) (02/13 0419) Pulse Rate:  [79-86] 79 (02/13 0419) Resp:  [16-17] 17 (02/13 0419) BP: (128-132)/(60-66) 128/60 mmHg (02/13 0419) SpO2:  [92 %-95 %] 92 % (02/13 0419) Last BM Date:  (unknown , pt has dementia)  Intake/Output from previous day: 02/12 0701 - 02/13 0700 In: 1266.3 [P.O.:240; I.V.:1026.3] Out: 925 [Urine:825; Stool:100] Intake/Output this shift:    Wound has much less swelling and much softer. The second drain was removed. There is no erythema. Palpation doesn't reveal any discomfort at this point of significance.  Lab Results:   Recent Labs  03/23/15 0545  WBC 9.8  HGB 10.0*  HCT 32.1*  PLT 240   BMET  Recent Labs  03/23/15 0545 03/24/15 0540  NA 140 138  K 3.3* 3.4*  CL 103 109  CO2 24 22  GLUCOSE 126* 130*  BUN 10 12  CREATININE 2.38* 1.79*  CALCIUM 7.8* 8.1*   PT/INR No results for input(s): LABPROT, INR in the last 72 hours. ABG No results for input(s): PHART, HCO3 in the last 72 hours.  Invalid input(s): PCO2, PO2  Studies/Results: No results found.  Anti-infectives: Anti-infectives    Start     Dose/Rate Route Frequency Ordered Stop   03/23/15 1800  vancomycin (VANCOCIN) 500 mg in sodium chloride 0.9 % 100 mL IVPB     500 mg 100 mL/hr over 60 Minutes Intravenous Every 12 hours 03/23/15 1011     03/23/15 0000  piperacillin-tazobactam (ZOSYN) IVPB 2.25 g     2.25 g 100 mL/hr over 30 Minutes Intravenous Every 8 hours 03/22/15 2339     03/20/15 0600  vancomycin (VANCOCIN) 500 mg in sodium chloride 0.9 % 100 mL IVPB  Status:  Discontinued     500 mg 100 mL/hr over 60 Minutes Intravenous Every 12 hours 03/19/15 1610 03/23/15 1002   03/20/15 0000  piperacillin-tazobactam (ZOSYN) IVPB 3.375 g  Status:  Discontinued      3.375 g 12.5 mL/hr over 240 Minutes Intravenous Every 8 hours 03/19/15 1610 03/22/15 2339   03/19/15 1700  vancomycin (VANCOCIN) IVPB 1000 mg/200 mL premix     1,000 mg 200 mL/hr over 60 Minutes Intravenous  Once 03/19/15 1551 03/19/15 1754   03/19/15 1630  piperacillin-tazobactam (ZOSYN) IVPB 3.375 g     3.375 g 100 mL/hr over 30 Minutes Intravenous  Once 03/19/15 1558 03/19/15 1724   03/19/15 1045  clindamycin (CLEOCIN) IVPB 900 mg     900 mg 100 mL/hr over 30 Minutes Intravenous  Once 03/19/15 1035 03/19/15 1142      Assessment/Plan: s/p Procedure(s) with comments: INCISION AND DRAINAGE ABSCESS (Right) - Incision and drainage right submandibular abcess Think discharge to home is appropriate at this point. The pathology nor culture and sensitivity is in epic and I need to check with the labs. She can follow-up in my office in about 1 week. Keep the wound covered as long as it is draining. I would recommend home on clindamycin.  LOS: 5 days    Lori Long 03/24/2015

## 2015-03-24 NOTE — Progress Notes (Signed)
1mg iv morphine administered for c/o pain 

## 2015-03-24 NOTE — Progress Notes (Signed)
Pt has been very restless this shift worse into the evening hours.Dr.Krishnan notified of pt pulling lines and foley and constantly trying to get OOB. Rash has gotten worse extending down legs, warm to touch. I have paged and texted Dr. [place] Pink at this time

## 2015-03-24 NOTE — Progress Notes (Signed)
Texted Dr. [place] Pink  Of red rash on trunk of body. C/o itching

## 2015-03-24 NOTE — Care Management Important Message (Signed)
Important Message  Patient Details  Name: MARELY NATALI MRN: [id number] Date of Birth: 10-16-1923   Medicare Important Message Given:  Yes    Annabelle Rexroad P West 03/24/2015, 1:55 PM

## 2015-03-25 DIAGNOSIS — C760 Malignant neoplasm of head, face and neck: Secondary | ICD-10-CM | POA: Diagnosis present

## 2015-03-25 LAB — BASIC METABOLIC PANEL
Anion gap: 13 (ref 5–15)
BUN: 8 mg/dL (ref 6–20)
CALCIUM: 8.4 mg/dL — AB (ref 8.9–10.3)
CO2: 22 mmol/L (ref 22–32)
CREATININE: 1.16 mg/dL — AB (ref 0.44–1.00)
Chloride: 109 mmol/L (ref 101–111)
GFR calc Af Amer: 47 mL/min — ABNORMAL LOW (ref >60)
GFR calc non Af Amer: 40 mL/min — ABNORMAL LOW (ref >60)
GLUCOSE: 144 mg/dL — AB (ref 65–99)
Potassium: 3.2 mmol/L — ABNORMAL LOW (ref 3.5–5.1)
Sodium: 144 mmol/L (ref 135–145)

## 2015-03-25 LAB — GLUCOSE, CAPILLARY
GLUCOSE-CAPILLARY: 135 mg/dL — AB (ref 65–99)
Glucose-Capillary: 140 mg/dL — ABNORMAL HIGH (ref 65–99)

## 2015-03-25 LAB — CULTURE, BLOOD (ROUTINE X 2): CULTURE: NO GROWTH

## 2015-03-25 MED ORDER — HYDRALAZINE HCL 10 MG PO TABS: 10.0000 mg | ORAL_TABLET | Freq: Three times a day (TID) | ORAL | Status: AC

## 2015-03-25 MED ORDER — CLINDAMYCIN HCL 300 MG PO CAPS: 300.0000 mg | ORAL_CAPSULE | Freq: Three times a day (TID) | ORAL | Status: AC

## 2015-03-25 MED ORDER — HYDRALAZINE HCL 20 MG/ML IJ SOLN
5.0000 mg | Freq: Once | INTRAMUSCULAR | Status: AC
  Administered 2015-03-25: 5 mg via INTRAVENOUS
  Filled 2015-03-25: qty 1

## 2015-03-25 MED ORDER — TRAMADOL HCL 50 MG PO TABS: 100.0000 mg | ORAL_TABLET | Freq: Three times a day (TID) | ORAL | Status: AC

## 2015-03-25 NOTE — Progress Notes (Signed)
Vesta Mixer to be D/C'd back to [facility] per MD order. Discussed with the patient and all questions fully answered.  VSS, Skin clean, dry and intact without evidence of skin break down, no evidence of skin tears noted. Rash noted to trunk area.  IV catheter discontinued intact. Site without signs and symptoms of complications. Dressing and pressure applied.  Packet prepared by Education officer, museum and sent with patient's family to facility.  Packet includes prescription. Report called to RN at [facility]. Attempted multiple times to make follow up appointment with Dr. Janace Hoard office, but was left on hold >15 minutes everytime and unable to get through to make appointment. Family aware.  D/c education completed with patient/family including follow up instructions, medication list, d/c activities limitations if indicated, with other d/c instructions as indicated by MD - patient able to verbalize understanding, all questions fully answered.   Patient instructed to return to ED, call 911, or call MD for any changes in condition.   Patient to be escorted via [place], and D/C home via private auto.

## 2015-03-25 NOTE — NC FL2 (Signed)
[place] LEVEL OF CARE SCREENING TOOL     IDENTIFICATION  Patient Name: Lori Long Birthdate: Jun 28, 1923 Sex: female Admission Date (Current Location): 03/19/2015  [facility] and [place] Number:  Herbalist and Address:  [facility], [place] [address] [place], [place] [address]      Provider Number: [id number]  Attending Physician Name and Address:  Annita Brod, MD  Relative Name and Phone Number:       Current Level of Care: Hospital Recommended Level of Care: Other (Comment) [facility]) Prior Approval Number:    Date Approved/Denied:   PASRR Number:    Discharge Plan: Other (Comment) ([place])    Current Diagnoses: Patient Active Problem List   Diagnosis Date Noted  . Carcinoma of head and neck 03/25/2015  . AKI (acute kidney injury) ([place])   . Submandibular abscess   . Suppurative lymphadenitis 03/19/2015  . Neck infection 03/19/2015  . HCAP (healthcare-associated pneumonia) 07/20/2014  . Hypertension 04/24/2014  . Hyponatremia 04/24/2014  . Leukocytosis 04/24/2014  . DM II (diabetes mellitus, type II), controlled ([place]) 04/24/2014  . COPD (chronic obstructive pulmonary disease) ([place]) 04/24/2014  . Dementia 04/24/2014  . Heart murmur 04/24/2014  . Back pain   . Coronary artery disease     Orientation RESPIRATION BLADDER Height & Weight     Self  Normal Indwelling catheter Weight: 158 lb 1.1 oz (71.7 kg) Height:  5\' 4"  (162.6 cm)  BEHAVIORAL SYMPTOMS/MOOD NEUROLOGICAL BOWEL NUTRITION STATUS      Continent Diet (low sodium)  AMBULATORY STATUS COMMUNICATION OF NEEDS Skin   Extensive Assist Verbally  (incisions)                       Personal Care Assistance Level of Assistance  Bathing, Feeding, Dressing Bathing Assistance: Maximum assistance Feeding assistance: Independent Dressing Assistance: Maximum assistance     Functional Limitations Info  Sight,  Hearing, Speech Sight Info: Adequate Hearing Info: Impaired Speech Info: Adequate    SPECIAL CARE FACTORS FREQUENCY  PT (By licensed PT), OT (By licensed OT)                    Contractures      Additional Factors Info  Allergies, Code Status Code Status Info: DNR Allergies Info: Clonidine Derivatives, Ezetimibe-simvastatin, Lipitor, Metformin And Related, Morphine And Related, Codeine           Current Medications (03/25/2015):  This is the current hospital active medication list Current Facility-Administered Medications  Medication Dose Route Frequency Provider Last Rate Last Dose  . 0.9 %  sodium chloride infusion   Intravenous Continuous Annita Brod, MD 100 mL/hr at 03/25/15 0752    . acetaminophen (TYLENOL) tablet 650 mg  650 mg Oral Q6H PRN Bonnielee Haff, MD   650 mg at 03/23/15 2347   Or  . acetaminophen (TYLENOL) suppository 650 mg  650 mg Rectal Q6H PRN Bonnielee Haff, MD      . antiseptic oral rinse (CPC / CETYLPYRIDINIUM CHLORIDE 0.05%) solution 7 mL  7 mL Mouth Rinse q12n4p Bonnielee Haff, MD   7 mL at 03/25/15 1200  . brimonidine (ALPHAGAN) 0.2 % ophthalmic solution 1 drop  1 drop Both Eyes Daily Bonnielee Haff, MD   1 drop at 03/25/15 0939   And  . timolol (TIMOPTIC) 0.5 % ophthalmic solution 1 drop  1 drop Both Eyes Daily Bonnielee Haff, MD   1 drop  at 03/25/15 0940  . chlorhexidine (PERIDEX) 0.12 % solution 15 mL  15 mL Mouth Rinse BID Bonnielee Haff, MD   15 mL at 03/25/15 0939  . clonazePAM (KLONOPIN) tablet 0.5 mg  0.5 mg Oral QHS Bonnielee Haff, MD   0.5 mg at 03/24/15 2103  . cloNIDine (CATAPRES) tablet 0.1 mg  0.1 mg Oral QHS Bonnielee Haff, MD   0.1 mg at 03/24/15 2102  . docusate sodium (COLACE) capsule 200 mg  200 mg Oral BID Bonnielee Haff, MD   200 mg at 03/25/15 0939  . enoxaparin (LOVENOX) injection 30 mg  30 mg Subcutaneous Q24H Annita Brod, MD   30 mg at 03/24/15 1559  . feeding supplement (GLUCERNA SHAKE) (GLUCERNA SHAKE) liquid  237 mL  237 mL Oral TID BM Annita Brod, MD   237 mL at 03/24/15 2019  . hydrALAZINE (APRESOLINE) injection 5 mg  5 mg Intravenous Once Annita Brod, MD      . insulin aspart (novoLOG) injection 0-15 Units  0-15 Units Subcutaneous TID WC Bonnielee Haff, MD   2 Units at 03/25/15 1249  . levothyroxine (SYNTHROID, LEVOTHROID) tablet 50 mcg  50 mcg Oral QAC breakfast Bonnielee Haff, MD   50 mcg at 03/25/15 0753  . LORazepam (ATIVAN) injection 0.5 mg  0.5 mg Intravenous Q6H PRN Annita Brod, MD   0.5 mg at 03/24/15 1833  . memantine (NAMENDA) tablet 5 mg  5 mg Oral BID Bonnielee Haff, MD   5 mg at 03/25/15 0939  . metoprolol tartrate (LOPRESSOR) tablet 25 mg  25 mg Oral BID Bonnielee Haff, MD   25 mg at 03/25/15 0939  . morphine 2 MG/ML injection 1 mg  1 mg Intravenous Q4H PRN Annita Brod, MD   1 mg at 03/25/15 0310  . ondansetron (ZOFRAN) tablet 4 mg  4 mg Oral Q6H PRN Bonnielee Haff, MD       Or  . ondansetron (ZOFRAN) injection 4 mg  4 mg Intravenous Q6H PRN Bonnielee Haff, MD   4 mg at 03/21/15 1829  . oxyCODONE (Oxy IR/ROXICODONE) immediate release tablet 5 mg  5 mg Oral Q4H PRN Bonnielee Haff, MD   5 mg at 03/22/15 0843  . piperacillin-tazobactam (ZOSYN) IVPB 2.25 g  2.25 g Intravenous Q8H Franky Macho, RPH   2.25 g at 03/25/15 0752  . pravastatin (PRAVACHOL) tablet 40 mg  40 mg Oral QPM Bonnielee Haff, MD   40 mg at 03/24/15 1559  . sodium chloride flush (NS) 0.9 % injection 3 mL  3 mL Intravenous Q12H Bonnielee Haff, MD   3 mL at 03/23/15 2136  . triamcinolone (KENALOG) 0.025 % cream   Topical BID PRN Annita Brod, MD      . vancomycin (VANCOCIN) 500 mg in sodium chloride 0.9 % 100 mL IVPB  500 mg Intravenous Q12H Eudelia Bunch, RPH   500 mg at 03/25/15 [id number]     Discharge Medications: Please see discharge summary for a list of discharge medications.  Relevant Imaging Results:  Relevant Lab Results:   Additional Information SS#: [id number]  Raymondo Band, LCSW

## 2015-03-25 NOTE — Discharge Summary (Signed)
Discharge Summary  Lori Long [id number] DOB: [birth year]  PCP: Marjorie Smolder, MD  Admit date: 03/19/2015 Discharge date: 03/25/2015  Time spent: 25 minutes  Recommendations for Outpatient Follow-up:  1. Patient will follow up with Dr. Janace Hoard in 1 week 2. New medication: Hydralazine 10 mg by mouth 3 times a day 3. Medication change: Patient's Diovan discontinued because of renal failure 4. New medication: Clindamycin 300 mg by mouth 4 times a day 4 days 5. Dressing instructions: Leave dressing on until seeing Dr. Janace Hoard in follow-up  Discharge Diagnoses:  Active Hospital Problems   Diagnosis Date Noted  . Neck infection 03/19/2015  . Carcinoma of head and neck 03/25/2015  . AKI (acute kidney injury) ([place])   . Submandibular abscess   . Suppurative lymphadenitis 03/19/2015  . DM II (diabetes mellitus, type II), controlled ([place]) 04/24/2014  . COPD (chronic obstructive pulmonary disease) ([place]) 04/24/2014  . Dementia 04/24/2014  . Hypertension 04/24/2014    Resolved Hospital Problems   Diagnosis Date Noted Date Resolved  No resolved problems to display.    Discharge Condition: Improved, being discharged back to skilled nursing  Diet recommendation: Carb modified  Filed Weights   03/19/15 1548 03/20/15 2303  Weight: 67.8 kg (149 lb 7.6 oz) 71.7 kg (158 lb 1.1 oz)    History of present illness:  82 year old female with past medical history of diabetes, COPD and dementia who lives in a nursing facility sent over to emergency room on 2/8 by her dentist for significant swelling to her right face and neck area concerning for facial cellulitis. CT scan noted suppurative lymphadenitis   Hospital Course:  Principal Problem:   Neck carcinoma with secondary Neck infection/suppurative lymphadenitis/submandibular abscess: After hospitalist spoke with ENT, patient transferred from [place] to [place] of right-sided neck abscess on 2/10. Following surgery, patient  still had some active drainage. Continued on IV antibiotics. With antibiotics and I&D, she did have improvement and ENT felt this was indeed infection. After reviewing pathology, they found positive for carcinoma of the neck. After giving this information to patient and family, they will decide about whether further workup is decided. He'll follow up with ENT in one week. In the meantime we will leave on dressing over right side of neck. Active Problems:   Hypertension essential: Blood pressure initially elevated with pain, improving. Norvasc, clonidine and Lopressor continued. With worsening renal failure, ARB discontinued and patient discharged on new hydralazine.    DM II (diabetes mellitus, type II), controlled ([place]): Blood sugars remained stable, patient did have poor by mouth intake during her hospitalization.    COPD (chronic obstructive pulmonary disease) ([facility])   Dementia without any acute behavioral disturbance: Continued on Namenda.    AKI (acute kidney injury) ([place]): Following surgery, patient's creatinine started to rise.? From vancomycin nephrotoxicity versus hypotension brought on by poor by mouth mouth intake. Patient's Neurontin initially held because a creatinine clearance and she was hydrated. She responded well to IV fluids. Urinalysis noted some granular casts, however patient creatinine became near normal by/14 morning. She was continued on IV fluids and by that afternoon, felt to be stable and discharged. Her ARB has been discontinued   Consultants:  ENT   Procedures  Status post I&D and biopsy of neck abscess done 2/10  Discharge Exam: BP 179/65 mmHg  Pulse 88  Temp(Src) 98.4 F (36.9 C) (Oral)  Resp 16  Ht 5\' 4"  (1.626 m)  Wt 71.7 kg (158 lb 1.1 oz)  BMI 27.12 kg/m2  SpO2 98%  General: Alert and oriented 2, no acute distress  Cardiovascular: regular rate and rhythm, S1-S2   Respiratory: clear to auscultation bilaterally    Discharge Instructions You  were cared for by a hospitalist during your hospital stay. If you have any questions about your discharge medications or the care you received while you were in the hospital after you are discharged, you can call the unit and asked to speak with the hospitalist on call if the hospitalist that took care of you is not available. Once you are discharged, your primary care physician will handle any further medical issues. Please note that NO REFILLS for any discharge medications will be authorized once you are discharged, as it is imperative that you return to your primary care physician (or establish a relationship with a primary care physician if you do not have one) for your aftercare needs so that they can reassess your need for medications and monitor your lab values.  Discharge Instructions    Diet - low sodium heart healthy    Complete by:  As directed      Increase activity slowly    Complete by:  As directed             Medication List    STOP taking these medications        amoxicillin-clavulanate 875-125 MG tablet  Commonly known as:  AUGMENTIN     guaiFENesin 100 MG/5ML liquid  Commonly known as:  ROBITUSSIN      TAKE these medications        acetaminophen 650 MG CR tablet  Commonly known as:  TYLENOL  Take 1,300 mg by mouth 2 (two) times daily as needed for pain.     albuterol 108 (90 Base) MCG/ACT inhaler  Commonly known as:  PROVENTIL HFA;VENTOLIN HFA  Inhale 2 puffs into the lungs every 6 (six) hours as needed for wheezing or shortness of breath.     amLODipine 5 MG tablet  Commonly known as:  NORVASC  Take 5 mg by mouth every morning.     cholecalciferol 1000 units tablet  Commonly known as:  VITAMIN D  Take 1,000 Units by mouth daily.     clindamycin 300 MG capsule  Commonly known as:  CLEOCIN  Take 1 capsule (300 mg total) by mouth 3 (three) times daily.     clonazePAM 0.5 MG tablet  Commonly known as:  KLONOPIN  Take 0.5 mg by mouth at bedtime.      cloNIDine 0.1 MG tablet  Commonly known as:  CATAPRES  Take 1 tablet by mouth at bedtime.     COMBIGAN 0.2-0.5 % ophthalmic solution  Generic drug:  brimonidine-timolol  Place 1 drop into both eyes daily.     docusate sodium 100 MG capsule  Commonly known as:  COLACE  Take 200 mg by mouth 2 (two) times daily.     ferrous fumarate 325 (106 Fe) MG Tabs tablet  Commonly known as:  HEMOCYTE - 106 mg FE  Take 325 tablets by mouth every other day.     fish oil-omega-3 fatty acids 1000 MG capsule  Take 1 g by mouth every morning.     gabapentin 600 MG tablet  Commonly known as:  NEURONTIN  Take 600 mg by mouth 3 (three) times daily.     glipiZIDE 10 MG 24 hr tablet  Commonly known as:  GLUCOTROL XL  Take 10 mg by mouth daily with breakfast.     hydrALAZINE 10  MG tablet  Commonly known as:  APRESOLINE  Take 1 tablet (10 mg total) by mouth 3 (three) times daily.     levothyroxine 50 MCG tablet  Commonly known as:  SYNTHROID, LEVOTHROID  Take 50 mcg by mouth daily before breakfast.     memantine 5 MG tablet  Commonly known as:  NAMENDA  Take 5 mg by mouth 2 (two) times daily.     metoprolol tartrate 25 MG tablet  Commonly known as:  LOPRESSOR  Take 25 mg by mouth 2 (two) times daily.     omeprazole 20 MG capsule  Commonly known as:  PRILOSEC  Take 40 mg by mouth daily.     pravastatin 40 MG tablet  Commonly known as:  PRAVACHOL  Take 40 mg by mouth every evening.     PRESERVISION/LUTEIN PO  Take 2 tablets by mouth every morning.     SYSTANE BALANCE 0.6 % Soln  Generic drug:  Propylene Glycol  Place 1 drop into both eyes daily.     traMADol 50 MG tablet  Commonly known as:  ULTRAM  Take 2 tablets (100 mg total) by mouth 3 (three) times daily.     valsartan 320 MG tablet  Commonly known as:  DIOVAN  Take 0.5 tablets (160 mg total) by mouth every morning.       Allergies  Allergen Reactions  . Clonidine Derivatives     Local reaction to patch.  .  Ezetimibe-Simvastatin Other (See Comments)    Leg pain from Vytorin  . Lipitor [Atorvastatin Calcium] Other (See Comments)    Couldn't tolerate  . Metformin And Related Diarrhea  . Morphine And Related Other (See Comments)    hallucination  . Codeine Rash      The results of significant diagnostics from this hospitalization (including imaging, microbiology, ancillary and laboratory) are listed below for reference.    Significant Diagnostic Studies: Ct Soft Tissue Neck W Contrast  03/19/2015  CLINICAL DATA:  Right facial swelling.  Leukocytosis. EXAM: CT NECK WITH CONTRAST TECHNIQUE: Multidetector CT imaging of the neck was performed using the standard protocol following the bolus administration of intravenous contrast. CONTRAST:  78mL OMNIPAQUE IOHEXOL 300 MG/ML  SOLN COMPARISON:  None. FINDINGS: Pharynx and larynx: Negative for pharyngeal mass or edema. Airway is intact. Larynx is normal. Salivary glands: Parotid gland normal bilaterally. Left submandibular gland normal. Right submandibular gland appears atrophic. Lateral to the right submandibular gland are enlarged lymph nodes with fluid-filled center. These measure 13 mm and 14 mm. There is also a 14 mm solid lymph node in this area. There is surrounding soft tissue edema and thickening of the platysmas muscle suggesting acute infection. No calculus identified. Thyroid: Left thyroid lobe normal. Surgical resection right thyroid lobe without mass lesion. Lymph nodes: Suppurative adenopathy right submandibular region measuring 13 mm and 14 mm. Solid submandibular node on the right 14 mm. These have surrounding soft tissue edema and thickening of the platysmas muscle. There is edema in the subcutaneous fat. Findings suggest acute infection and separate adenopathy. No other adenopathy. Vascular: Extensive atherosclerotic calcification in the aortic arch proximal great vessels and carotid bifurcation bilaterally. Internal carotid artery patent  bilaterally. Jugular vein patent bilaterally. Left jugular vein dominant. Limited intracranial: Generalized atrophy. No acute intracranial abnormality. Visualized orbits: Negative Mastoids and visualized paranasal sinuses: Mild mucosal edema in the right maxillary sinus. No air-fluid level. Skeleton: No evidence of dental infection. 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 Marked degeneration at C3-4 with spurring. Upper chest:  Lung apices are clear. IMPRESSION: Enlarged liquefied submandibular nodes on the right. In addition, there is a solid enlarged lymph node in this area. There is surrounding soft tissue edema suggesting acute infection and suppurative adenitis. This is immediately adjacent to the submandibular gland which is atrophic. No evidence of acute dental infection. These results were called by telephone at the time of interpretation on 03/19/2015 at 12:53 pm to Dr. Deno Etienne , who verbally acknowledged these results. Electronically Signed   By: Franchot Gallo M.D.   On: 03/19/2015 12:53    Microbiology: Recent Results (from the past 240 hour(s))  Culture, blood (routine x 2)     Status: None   Collection Time: 03/19/15  4:31 PM  Result Value Ref Range Status   Specimen Description BLOOD LEFT ARM  Final   Special Requests BOTTLES DRAWN AEROBIC AND ANAEROBIC 5CC  Final   Culture   Final    NO GROWTH 5 DAYS Performed at [facility]    Report Status 03/24/2015 FINAL  Final  Culture, blood (routine x 2)     Status: None (Preliminary result)   Collection Time: 03/19/15  7:27 PM  Result Value Ref Range Status   Specimen Description BLOOD RIGHT ANTECUBITAL  Final   Special Requests AEROCOCCUS SPECIES 5 ML  Final   Culture   Final    NO GROWTH 4 DAYS Performed at [facility]    Report Status PENDING  Incomplete  MRSA PCR Screening     Status: None   Collection Time: 03/19/15 10:04 PM  Result Value Ref Range Status   MRSA by PCR NEGATIVE NEGATIVE Final    Comment:        The  GeneXpert MRSA Assay (FDA approved for NASAL specimens only), is one component of a comprehensive MRSA colonization surveillance program. It is not intended to diagnose MRSA infection nor to guide or monitor treatment for MRSA infections.      Labs: Basic Metabolic Panel:  Recent Labs Lab 03/19/15 1113 03/19/15 1122 03/20/15 0525 03/22/15 1712 03/23/15 0545 03/24/15 0540 03/25/15 0941  NA 131* 131* 132*  --  140 138 144  K 4.2 4.0 3.8  --  3.3* 3.4* 3.2*  CL 96* 94* 100*  --  103 109 109  CO2 25  --  25  --  24 22 22   GLUCOSE 169* 168* 146*  --  126* 130* 144*  BUN 13 12 8   --  10 12 8   CREATININE 0.90 0.80 0.99 2.57* 2.38* 1.79* 1.16*  CALCIUM 8.5*  --  7.8*  --  7.8* 8.1* 8.4*   Liver Function Tests:  Recent Labs Lab 03/20/15 0525  AST 13*  ALT 8*  ALKPHOS 65  BILITOT 0.7  PROT 5.5*  ALBUMIN 2.9*   No results for input(s): LIPASE, AMYLASE in the last 168 hours. No results for input(s): AMMONIA in the last 168 hours. CBC:  Recent Labs Lab 03/19/15 1113 03/19/15 1122 03/20/15 0525 03/23/15 0545  WBC 12.1*  --  10.8* 9.8  NEUTROABS 7.8*  --   --   --   HGB 11.5* 12.9 10.5* 10.0*  HCT 35.5* 38.0 32.7* 32.1*  MCV 92.7  --  93.4 92.8  PLT 255  --  221 240   Cardiac Enzymes: No results for input(s): CKTOTAL, CKMB, CKMBINDEX, TROPONINI in the last 168 hours. BNP: BNP (last 3 results) No results for input(s): BNP in the last 8760 hours.  ProBNP (last 3 results) No results  for input(s): PROBNP in the last 8760 hours.  CBG:  Recent Labs Lab 03/24/15 1248 03/24/15 1651 03/24/15 2226 03/25/15 0757 03/25/15 1135  GLUCAP 132* 128* 150* 135* 140*       Signed:  Ponce Skillman K  [facility] 03/25/2015, 3:12 PM

## 2015-03-25 NOTE — Care Management Note (Signed)
Case Management Note  Patient Details  Name: Lori Long MRN: [id number] Date of Birth: 06-21-1923  Subjective/Objective:                    Action/Plan:   Expected Discharge Date:   (unknown)               Expected Discharge Plan:  Skilled Nursing Facility  In-House Referral:  Clinical Social Work  Discharge planning Services  CM Consult  Post Acute Care Choice:    Choice offered to:     DME Arranged:    DME Agency:     HH Arranged:    [place] Agency:     Status of Service:  In process, will continue to follow  Medicare Important Message Given:  Yes Date Medicare IM Given:    Medicare IM give by:    Date Additional Medicare IM Given:    Additional Medicare Important Message give by:     If discussed at [place] of Stay Meetings, dates discussed:  03-25-15  Additional Comments: UR updated  Marilu Favre, RN 03/25/2015, 1:54 PM

## 2015-03-25 NOTE — Progress Notes (Signed)
Patient ID: Lori Long, female   DOB: 10-24-1923, 82 y.o.   MRN: [id number] She has a carcinoma of the right neck Clearly it was infected as she has improved with drainage and abx, and was a degree of necrosis. I have  a PET scan to try to find the primary but so far there is not any evidence of the source. She wants to go home and think about the decision. We can order a PET and direct laryngoscopy as an outpatient. F/U in my office in one week and have then call me if they decide to proceed with testing so I can order it.

## 2015-03-25 NOTE — Progress Notes (Signed)
Pharmacy Antibiotic Note  Lori Long is a 82 y.o. female NH resident sent from the dentist on 03/19/2015 with neck swelling, adenitis, cellulitis.  Now day #7 of abx for neck infection, submandibular abscess.  S/p I/D on 2/10 (++)pain in submax area, (-)excessive drainage. Afebrile, WBC wnl. SCr down to 1.79, CrCl ~32ml/min.    Plan: Continue vancomycin 500mg  IV Q12 Continue Zosyn 2.25g IV Q8 Monitor clinical picture, renal function, VT prn F/U C&S, abx deescalation / LOT  Consider de-escalating abx to PO?  Height: 5\' 4"  (162.6 cm) Weight: 158 lb 1.1 oz (71.7 kg) IBW/kg (Calculated) : 54.7  Temp (24hrs), Avg:97.9 F (36.6 C), Min:97.8 F (36.6 C), Max:97.9 F (36.6 C)   Recent Labs Lab 03/19/15 1113  03/19/15 1631 03/20/15 0525 03/22/15 1712 03/23/15 0545 03/24/15 0540 03/25/15 0941  WBC 12.1*  --   --  10.8*  --  9.8  --   --   CREATININE 0.90  < >  --  0.99 2.57* 2.38* 1.79* 1.16*  LATICACIDVEN  --   --  0.8  --   --   --   --   --   VANCOTROUGH  --   --   --   --  16  --   --   --   < > = values in this interval not displayed.  Estimated Creatinine Clearance: 31.9 mL/min (by C-G formula based on Cr of 1.16).    Allergies  Allergen Reactions  . Clonidine Derivatives     Local reaction to patch.  . Ezetimibe-Simvastatin Other (See Comments)    Leg pain from Vytorin  . Lipitor [Atorvastatin Calcium] Other (See Comments)    Couldn't tolerate  . Metformin And Related Diarrhea  . Morphine And Related Other (See Comments)    hallucination  . Codeine Rash    Antimicrobials this admission: 2/8 Vanc >>  2/8 Zosyn >>  Dose adjustments this admission: VT 2/11 = 16  Microbiology results: 2/8 BCx: ngtF 2/8 MRSA PCR: negative  Elenor Quinones, PharmD, [facility] Clinical Pharmacist Pager [phone] 03/25/2015 10:38 AM

## 2015-03-25 NOTE — Progress Notes (Signed)
CSW spoke with facility representative Tammy regarding patient's return. Per Tammy, patient can return at anytime. FL2 and Discharge Summary to be faxed to the facility. CSW informed patient and family. Family would like to transport the patient back to the facility.  Patient to be transported back to [facility] by daughters Inez [place] and [place]. Hard scripts will be sent with family. No further CSW needs were reported at this time. CSW to sign off.   Lucius Conn, [place] Worker [facility] Ph: [phone]

## 2015-04-03 ENCOUNTER — Inpatient Hospital Stay (HOSPITAL_COMMUNITY)
Admission: EM | Admit: 2015-04-03 | Discharge: 2015-04-09 | DRG: 640 | Disposition: E | Payer: Medicare Other | Attending: Internal Medicine | Admitting: Internal Medicine

## 2015-04-03 ENCOUNTER — Emergency Department (HOSPITAL_COMMUNITY): Payer: Medicare Other

## 2015-04-03 ENCOUNTER — Encounter (HOSPITAL_COMMUNITY): Payer: Self-pay | Admitting: Emergency Medicine

## 2015-04-03 DIAGNOSIS — Z79891 Long term (current) use of opiate analgesic: Secondary | ICD-10-CM

## 2015-04-03 DIAGNOSIS — I2510 Atherosclerotic heart disease of native coronary artery without angina pectoris: Secondary | ICD-10-CM | POA: Diagnosis present

## 2015-04-03 DIAGNOSIS — E43 Unspecified severe protein-calorie malnutrition: Secondary | ICD-10-CM | POA: Diagnosis present

## 2015-04-03 DIAGNOSIS — Z806 Family history of leukemia: Secondary | ICD-10-CM

## 2015-04-03 DIAGNOSIS — E876 Hypokalemia: Principal | ICD-10-CM | POA: Diagnosis present

## 2015-04-03 DIAGNOSIS — E1165 Type 2 diabetes mellitus with hyperglycemia: Secondary | ICD-10-CM | POA: Diagnosis present

## 2015-04-03 DIAGNOSIS — H3530 Unspecified macular degeneration: Secondary | ICD-10-CM | POA: Diagnosis present

## 2015-04-03 DIAGNOSIS — R410 Disorientation, unspecified: Secondary | ICD-10-CM | POA: Diagnosis present

## 2015-04-03 DIAGNOSIS — Z888 Allergy status to other drugs, medicaments and biological substances status: Secondary | ICD-10-CM

## 2015-04-03 DIAGNOSIS — Z79899 Other long term (current) drug therapy: Secondary | ICD-10-CM

## 2015-04-03 DIAGNOSIS — M8580 Other specified disorders of bone density and structure, unspecified site: Secondary | ICD-10-CM | POA: Diagnosis present

## 2015-04-03 DIAGNOSIS — J449 Chronic obstructive pulmonary disease, unspecified: Secondary | ICD-10-CM | POA: Diagnosis present

## 2015-04-03 DIAGNOSIS — C779 Secondary and unspecified malignant neoplasm of lymph node, unspecified: Secondary | ICD-10-CM | POA: Diagnosis present

## 2015-04-03 DIAGNOSIS — R627 Adult failure to thrive: Secondary | ICD-10-CM | POA: Diagnosis present

## 2015-04-03 DIAGNOSIS — Z8711 Personal history of peptic ulcer disease: Secondary | ICD-10-CM

## 2015-04-03 DIAGNOSIS — E7800 Pure hypercholesterolemia, unspecified: Secondary | ICD-10-CM | POA: Diagnosis present

## 2015-04-03 DIAGNOSIS — D649 Anemia, unspecified: Secondary | ICD-10-CM | POA: Diagnosis present

## 2015-04-03 DIAGNOSIS — G4730 Sleep apnea, unspecified: Secondary | ICD-10-CM | POA: Diagnosis present

## 2015-04-03 DIAGNOSIS — Z515 Encounter for palliative care: Secondary | ICD-10-CM | POA: Diagnosis present

## 2015-04-03 DIAGNOSIS — Z66 Do not resuscitate: Secondary | ICD-10-CM | POA: Diagnosis present

## 2015-04-03 DIAGNOSIS — I10 Essential (primary) hypertension: Secondary | ICD-10-CM | POA: Diagnosis present

## 2015-04-03 DIAGNOSIS — L8990 Pressure ulcer of unspecified site, unspecified stage: Secondary | ICD-10-CM | POA: Diagnosis present

## 2015-04-03 DIAGNOSIS — C760 Malignant neoplasm of head, face and neck: Secondary | ICD-10-CM | POA: Diagnosis present

## 2015-04-03 DIAGNOSIS — Z833 Family history of diabetes mellitus: Secondary | ICD-10-CM

## 2015-04-03 DIAGNOSIS — E119 Type 2 diabetes mellitus without complications: Secondary | ICD-10-CM

## 2015-04-03 DIAGNOSIS — Z885 Allergy status to narcotic agent status: Secondary | ICD-10-CM

## 2015-04-03 DIAGNOSIS — R531 Weakness: Secondary | ICD-10-CM

## 2015-04-03 DIAGNOSIS — R451 Restlessness and agitation: Secondary | ICD-10-CM | POA: Diagnosis present

## 2015-04-03 DIAGNOSIS — H409 Unspecified glaucoma: Secondary | ICD-10-CM | POA: Diagnosis present

## 2015-04-03 DIAGNOSIS — F0390 Unspecified dementia without behavioral disturbance: Secondary | ICD-10-CM | POA: Diagnosis present

## 2015-04-03 DIAGNOSIS — Z7984 Long term (current) use of oral hypoglycemic drugs: Secondary | ICD-10-CM

## 2015-04-03 DIAGNOSIS — L049 Acute lymphadenitis, unspecified: Secondary | ICD-10-CM | POA: Diagnosis present

## 2015-04-03 LAB — URINALYSIS, ROUTINE W REFLEX MICROSCOPIC
Bilirubin Urine: NEGATIVE
GLUCOSE, UA: 100 mg/dL — AB
HGB URINE DIPSTICK: NEGATIVE
Ketones, ur: NEGATIVE mg/dL
LEUKOCYTES UA: NEGATIVE
NITRITE: NEGATIVE
PROTEIN: NEGATIVE mg/dL
Specific Gravity, Urine: 1.008 (ref 1.005–1.030)
pH: 7.5 (ref 5.0–8.0)

## 2015-04-03 LAB — CBC
HCT: 35.8 % — ABNORMAL LOW (ref 36.0–46.0)
Hemoglobin: 11.6 g/dL — ABNORMAL LOW (ref 12.0–15.0)
MCH: 29.6 pg (ref 26.0–34.0)
MCHC: 32.4 g/dL (ref 30.0–36.0)
MCV: 91.3 fL (ref 78.0–100.0)
PLATELETS: 269 10*3/uL (ref 150–400)
RBC: 3.92 MIL/uL (ref 3.87–5.11)
RDW: 13.3 % (ref 11.5–15.5)
WBC: 8.4 10*3/uL (ref 4.0–10.5)

## 2015-04-03 LAB — I-STAT CG4 LACTIC ACID, ED
Lactic Acid, Venous: 1.35 mmol/L (ref 0.5–2.0)
Lactic Acid, Venous: 0.92 mmol/L (ref 0.5–2.0)

## 2015-04-03 LAB — COMPREHENSIVE METABOLIC PANEL
ALT: 13 U/L — AB (ref 14–54)
AST: 20 U/L (ref 15–41)
Albumin: 3.2 g/dL — ABNORMAL LOW (ref 3.5–5.0)
Alkaline Phosphatase: 69 U/L (ref 38–126)
Anion gap: 13 (ref 5–15)
BILIRUBIN TOTAL: 0.6 mg/dL (ref 0.3–1.2)
BUN: 8 mg/dL (ref 6–20)
CALCIUM: 8.1 mg/dL — AB (ref 8.9–10.3)
CO2: 27 mmol/L (ref 22–32)
CREATININE: 0.89 mg/dL (ref 0.44–1.00)
Chloride: 98 mmol/L — ABNORMAL LOW (ref 101–111)
GFR, EST NON AFRICAN AMERICAN: 56 mL/min — AB (ref >60)
Glucose, Bld: 200 mg/dL — ABNORMAL HIGH (ref 65–99)
Potassium: 2.8 mmol/L — ABNORMAL LOW (ref 3.5–5.1)
Sodium: 138 mmol/L (ref 135–145)
TOTAL PROTEIN: 6.4 g/dL — AB (ref 6.5–8.1)

## 2015-04-03 LAB — TROPONIN I: Troponin I: <0.03 ng/mL (ref <0.031)

## 2015-04-03 LAB — INFLUENZA PANEL BY PCR (TYPE A & B)
H1N1 flu by pcr: NOT DETECTED
INFLBPCR: NEGATIVE
Influenza A By PCR: NEGATIVE

## 2015-04-03 LAB — LIPASE, BLOOD: LIPASE: 24 U/L (ref 11–51)

## 2015-04-03 MED ORDER — KETOROLAC TROMETHAMINE 15 MG/ML IJ SOLN
15.0000 mg | Freq: Four times a day (QID) | INTRAMUSCULAR | Status: DC | PRN
  Administered 2015-04-03 – 2015-04-04 (×2): 15 mg via INTRAVENOUS
  Filled 2015-04-03 (×2): qty 1

## 2015-04-03 MED ORDER — SODIUM CHLORIDE 0.9% FLUSH
3.0000 mL | Freq: Two times a day (BID) | INTRAVENOUS | Status: DC
  Administered 2015-04-03 – 2015-04-05 (×3): 3 mL via INTRAVENOUS

## 2015-04-03 MED ORDER — ONDANSETRON HCL 4 MG/2ML IJ SOLN
4.0000 mg | Freq: Once | INTRAMUSCULAR | Status: AC | PRN
  Administered 2015-04-03: 4 mg via INTRAVENOUS
  Filled 2015-04-03: qty 2

## 2015-04-03 MED ORDER — POTASSIUM CHLORIDE 10 MEQ/100ML IV SOLN
10.0000 meq | INTRAVENOUS | Status: AC
  Administered 2015-04-03 (×3): 10 meq via INTRAVENOUS
  Filled 2015-04-03 (×3): qty 100

## 2015-04-03 MED ORDER — LORAZEPAM 2 MG/ML IJ SOLN
0.5000 mg | INTRAMUSCULAR | Status: DC | PRN
  Administered 2015-04-03 – 2015-04-05 (×4): 0.5 mg via INTRAVENOUS
  Filled 2015-04-03 (×4): qty 1

## 2015-04-03 MED ORDER — LORAZEPAM 2 MG/ML IJ SOLN
0.5000 mg | Freq: Once | INTRAMUSCULAR | Status: AC
  Administered 2015-04-03: 0.5 mg via INTRAVENOUS
  Filled 2015-04-03: qty 1

## 2015-04-03 MED ORDER — SODIUM CHLORIDE 0.9 % IV BOLUS (SEPSIS)
1000.0000 mL | INTRAVENOUS | Status: AC
  Administered 2015-04-03 (×2): 1000 mL via INTRAVENOUS

## 2015-04-03 MED ORDER — AMPICILLIN-SULBACTAM SODIUM 3 (2-1) G IJ SOLR
3.0000 g | Freq: Four times a day (QID) | INTRAMUSCULAR | Status: DC
  Administered 2015-04-03 – 2015-04-05 (×7): 3 g via INTRAVENOUS
  Filled 2015-04-03 (×9): qty 3

## 2015-04-03 MED ORDER — PANTOPRAZOLE SODIUM 40 MG IV SOLR
40.0000 mg | INTRAVENOUS | Status: DC
  Administered 2015-04-03 – 2015-04-04 (×2): 40 mg via INTRAVENOUS
  Filled 2015-04-03 (×2): qty 40

## 2015-04-03 MED ORDER — METOPROLOL TARTRATE 1 MG/ML IV SOLN
5.0000 mg | Freq: Four times a day (QID) | INTRAVENOUS | Status: DC
  Administered 2015-04-03 – 2015-04-05 (×6): 5 mg via INTRAVENOUS
  Filled 2015-04-03 (×6): qty 5

## 2015-04-03 MED ORDER — ONDANSETRON HCL 4 MG/2ML IJ SOLN
4.0000 mg | Freq: Four times a day (QID) | INTRAMUSCULAR | Status: AC | PRN
  Administered 2015-04-03: 4 mg via INTRAVENOUS
  Filled 2015-04-03: qty 2

## 2015-04-03 MED ORDER — POTASSIUM CHLORIDE IN NACL 40-0.9 MEQ/L-% IV SOLN
INTRAVENOUS | Status: DC
  Administered 2015-04-03: 50 mL/h via INTRAVENOUS
  Filled 2015-04-03: qty 1000

## 2015-04-03 MED ORDER — ENOXAPARIN SODIUM 40 MG/0.4ML ~~LOC~~ SOLN
40.0000 mg | SUBCUTANEOUS | Status: DC
  Administered 2015-04-03 – 2015-04-04 (×2): 40 mg via SUBCUTANEOUS
  Filled 2015-04-03 (×2): qty 0.4

## 2015-04-03 MED ORDER — POTASSIUM CHLORIDE CRYS ER 20 MEQ PO TBCR
40.0000 meq | EXTENDED_RELEASE_TABLET | Freq: Once | ORAL | Status: DC
  Filled 2015-04-03: qty 2

## 2015-04-03 MED ORDER — PROMETHAZINE HCL 25 MG/ML IJ SOLN
12.5000 mg | Freq: Once | INTRAMUSCULAR | Status: AC
  Administered 2015-04-03: 12.5 mg via INTRAVENOUS
  Filled 2015-04-03: qty 1

## 2015-04-03 MED ORDER — ONDANSETRON HCL 4 MG/2ML IJ SOLN
4.0000 mg | Freq: Four times a day (QID) | INTRAMUSCULAR | Status: AC | PRN
  Administered 2015-04-04: 4 mg via INTRAVENOUS
  Filled 2015-04-03: qty 2

## 2015-04-03 MED ORDER — ENSURE ENLIVE PO LIQD
237.0000 mL | Freq: Two times a day (BID) | ORAL | Status: DC
  Administered 2015-04-04 – 2015-04-05 (×3): 237 mL via ORAL

## 2015-04-03 MED ORDER — POTASSIUM CHLORIDE 20 MEQ/15ML (10%) PO SOLN
40.0000 meq | Freq: Once | ORAL | Status: AC
  Administered 2015-04-03: 40 meq via ORAL
  Filled 2015-04-03: qty 30

## 2015-04-03 NOTE — ED Notes (Signed)
Per EMS- Patient is a resident of [facility]. Staff reported that the patient has not eaten or taken meds x 1 1/2- 2 days. Family wanted the patient brought to the hospital. Patient very tearful during transport to the ED.

## 2015-04-03 NOTE — ED Notes (Signed)
Patient continuously crying and moaning in pain. The patient has made several attempts to void on bedpan and has been unable. Daughter states that patient reports that she has pressure to go but is unable. The patient has brief on. Patient does not verbalize anything other that crying to this RN when asked.  Advised Daughter that we would scan her bladder to see if it was full.

## 2015-04-03 NOTE — ED Notes (Signed)
BLADDER SCAN SHOWED OVER 999

## 2015-04-03 NOTE — ED Notes (Signed)
Patient states she does not want to walk down hallway RN [place] notified

## 2015-04-03 NOTE — ED Notes (Signed)
Bed: WA08 Expected date:  Expected time:  Means of arrival:  Comments: For [facility] A

## 2015-04-03 NOTE — ED Notes (Signed)
UNABLE TO COLLECT LAB PATIENT IS IN XRAY

## 2015-04-03 NOTE — ED Notes (Signed)
Notified charge of 20 min timer stated she will call back in patient care,

## 2015-04-03 NOTE — ED Notes (Signed)
Patient moved into room 8 - Patient has multiple complaints including Nausea. The patient is alert, questionable orientation to place and time since she will not answer questions. Daughter at the bedside with the patient at this time. The patient rectal temperature obtained and history received from her daughter. Noted a black healing wound to her right chin from a recent biopsy.

## 2015-04-03 NOTE — ED Notes (Signed)
Bed: [facility] Expected date:  Expected time:  Means of arrival:  Comments: EMS[facility]

## 2015-04-03 NOTE — ED Provider Notes (Signed)
CSN: [id number]     Arrival date & time 03/20/2015  1156 History   First MD Initiated Contact with Patient 03/18/2015 1508     Chief Complaint  Patient presents with  . Nausea     (Consider location/radiation/quality/duration/timing/severity/associated sxs/prior Treatment) HPI Comments: Pt comes in with cc of weakness. Pt has hx of COPD, DM, CAD and a recent diagnosis of squamous cell CA of the neck. Pt's grand daughter, who also has a joint POA is at the bedside, and reports that after discharge post neck mass drainage 9 days ago, pt has never been the same. Pt has had increased weakness, and she no longer walks and just lays in the bed. Pt also has had poor appetite. Pt had been complaining of nausea and abd discomfort and difficulty voiding when she arrived, a bladder scan was performed and showed almost a liter of urine, so a catheter was placed. Pt has been complaining of feeling cold and has a new cough and uri like sx.    ROS 10 Systems reviewed and are negative for acute change except as noted in the HPI.     The history is provided by the patient.    Past Medical History  Diagnosis Date  . Coronary artery disease   . Hypertension   . Arthritis   . Diabetes mellitus   . Renal disorder   . Sleep apnea   . Thyroid disease   . Glaucoma   . Osteopenia   . Elevated cholesterol   . Back pain   . PUD (peptic ulcer disease)   . Macular degeneration   . Typhoid fever   . COPD (chronic obstructive pulmonary disease) ([place])   . Dementia   . Back pain   . HCAP (healthcare-associated pneumonia) 07/20/2014   Past Surgical History  Procedure Laterality Date  . Cholecystectomy    . Abdominal hysterectomy    . Cataract extraction    . Back surgery    . Thyroid surgery    . Incision and drainage abscess Right 03/21/2015    Procedure: INCISION AND DRAINAGE ABSCESS;  Surgeon: Melissa Montane, MD;  Location: [facility];  Service: ENT;  Laterality: Right;  Incision and drainage right  submandibular abcess   Family History  Problem Relation Age of Onset  . Leukemia Mother   . Pneumonia Father   . Diabetes Brother    Social History  Substance Use Topics  . Smoking status: Never Smoker   . Smokeless tobacco: None  . Alcohol Use: No   OB History    No data available     Review of Systems    Allergies  Clonidine derivatives; Ezetimibe-simvastatin; Lipitor; Metformin and related; Morphine and related; and Codeine  Home Medications   Prior to Admission medications   Medication Sig Start Date End Date Taking? Authorizing Provider  acetaminophen (TYLENOL) 650 MG CR tablet Take 1,300 mg by mouth 2 (two) times daily as needed for pain.    Yes Historical Provider, MD  albuterol (PROVENTIL HFA;VENTOLIN HFA) 108 (90 BASE) MCG/ACT inhaler Inhale 2 puffs into the lungs every 6 (six) hours as needed for wheezing or shortness of breath. 08/22/14  Yes Waldemar Dickens, MD  amLODipine (NORVASC) 5 MG tablet Take 5 mg by mouth every morning.   Yes Historical Provider, MD  brimonidine-timolol (COMBIGAN) 0.2-0.5 % ophthalmic solution Place 1 drop into both eyes daily.    Yes Historical Provider, MD  cholecalciferol (VITAMIN D) 1000 UNITS tablet Take 1,000 Units by mouth  daily.    Yes Historical Provider, MD  clonazePAM (KLONOPIN) 0.5 MG tablet Take 0.5 mg by mouth at bedtime.   Yes Historical Provider, MD  cloNIDine (CATAPRES) 0.1 MG tablet Take 1 tablet by mouth at bedtime. 04/08/14  Yes Historical Provider, MD  docusate sodium (COLACE) 100 MG capsule Take 200 mg by mouth 2 (two) times daily.    Yes Historical Provider, MD  ferrous sulfate 325 (65 FE) MG tablet Take 325 mg by mouth every other day.   Yes Historical Provider, MD  fish oil-omega-3 fatty acids 1000 MG capsule Take 1 g by mouth every morning.    Yes Historical Provider, MD  gabapentin (NEURONTIN) 600 MG tablet Take 600 mg by mouth 3 (three) times daily.   Yes Historical Provider, MD  glipiZIDE (GLUCOTROL XL) 10 MG 24 hr  tablet Take 10 mg by mouth daily with breakfast.   Yes Historical Provider, MD  hydrALAZINE (APRESOLINE) 10 MG tablet Take 1 tablet (10 mg total) by mouth 3 (three) times daily. 03/25/15  Yes Annita Brod, MD  levothyroxine (SYNTHROID, LEVOTHROID) 50 MCG tablet Take 50 mcg by mouth daily before breakfast.   Yes Historical Provider, MD  memantine (NAMENDA) 5 MG tablet Take 5 mg by mouth 2 (two) times daily.   Yes Historical Provider, MD  metoprolol tartrate (LOPRESSOR) 25 MG tablet Take 25 mg by mouth 2 (two) times daily.  02/18/13  Yes Historical Provider, MD  Multiple Vitamins-Minerals (PRESERVISION/LUTEIN PO) Take 2 tablets by mouth every morning.    Yes Historical Provider, MD  omeprazole (PRILOSEC) 20 MG capsule Take 40 mg by mouth daily.   Yes Historical Provider, MD  pravastatin (PRAVACHOL) 40 MG tablet Take 40 mg by mouth every evening.    Yes Historical Provider, MD  Propylene Glycol (SYSTANE BALANCE) 0.6 % SOLN Place 1 drop into both eyes daily.    Yes Historical Provider, MD  traMADol (ULTRAM) 50 MG tablet Take 2 tablets (100 mg total) by mouth 3 (three) times daily. 03/25/15  Yes Annita Brod, MD  valsartan (DIOVAN) 320 MG tablet Take 320 mg by mouth daily.   Yes Historical Provider, MD   BP 175/99 mmHg  Pulse 100  Temp(Src) 97.6 F (36.4 C) (Oral)  Resp 20  Ht 5\' 6"  (1.676 m)  Wt 150 lb 4.8 oz (68.176 kg)  BMI 24.27 kg/m2  SpO2 96% Physical Exam  Constitutional: She appears well-developed.  HENT:  Head: Normocephalic and atraumatic.  Right submandibular region has a hard lesion - no facial erythema and no submandibular fluctuance. Neg trismus.  Eyes: EOM are normal.  Neck: Normal range of motion. Neck supple. No JVD present.  Cardiovascular:  tachycardia  Pulmonary/Chest: Effort normal. No respiratory distress.  Abdominal: Soft. Bowel sounds are normal. She exhibits no distension and no mass.  Musculoskeletal: She exhibits no tenderness.  Neurological: She is  alert.  Oriented x 2 (not to year)  Skin: Skin is warm and dry. No rash noted.  Nursing note and vitals reviewed.   ED Course  Procedures (including critical care time) Labs Review Labs Reviewed  COMPREHENSIVE METABOLIC PANEL - Abnormal; Notable for the following:    Potassium 2.8 (*)    Chloride 98 (*)    Glucose, Bld 200 (*)    Calcium 8.1 (*)    Total Protein 6.4 (*)    Albumin 3.2 (*)    ALT 13 (*)    GFR calc non Af Amer 56 (*)    All other  components within normal limits  CBC - Abnormal; Notable for the following:    Hemoglobin 11.6 (*)    HCT 35.8 (*)    All other components within normal limits  URINALYSIS, ROUTINE W REFLEX MICROSCOPIC (NOT AT [facility]) - Abnormal; Notable for the following:    Glucose, UA 100 (*)    All other components within normal limits  URINE CULTURE  CULTURE, BLOOD (ROUTINE X 2)  CULTURE, BLOOD (ROUTINE X 2)  LIPASE, BLOOD  TROPONIN I  INFLUENZA PANEL BY PCR (TYPE A & B, H1N1)  MAGNESIUM  PHOSPHORUS  CBC WITH DIFFERENTIAL/PLATELET  COMPREHENSIVE METABOLIC PANEL  I-STAT CG4 LACTIC ACID, ED  I-STAT CG4 LACTIC ACID, ED    Imaging Review Dg Chest 2 View  03/31/2015  CLINICAL DATA:  Weakness EXAM: CHEST  2 VIEW COMPARISON:  12/24/2014 FINDINGS: Cardiomediastinal silhouette is stable. No acute infiltrate or pleural effusion. No pulmonary edema. Osteopenia and mild degenerative changes thoracic spine. Atherosclerotic calcifications of thoracic aorta. IMPRESSION: No active disease. Osteopenia and mild degenerative changes thoracic spine. Electronically Signed   By: Lahoma Crocker M.D.   On: 03/12/2015 15:51   I have personally reviewed and evaluated these images and lab results as part of my medical decision-making.   EKG Interpretation   Date/Time:  Thursday April 03 2015 15:54:02 EST Ventricular Rate:  103 PR Interval:    QRS Duration: 75 QT Interval:  386 QTC Calculation: 505 R Axis:   34 Text Interpretation:  Sinus rhythm Anteroseptal  infarct, age indeterminate  Prolonged QT interval No significant change since last tracing Confirmed  by Kathrynn Humble, MD, Thelma Comp [phone]) on 03/13/2015 5:24:26 PM      MDM   Final diagnoses:  Hypokalemia  Generalized weakness    DDx: Sepsis syndrome ACS syndrome DKA ICH/Stroke/brain bleed Infection - pneumonia/UTI/Cellulitis Dehydration Electrolyte abnormality Tox syndrome Facial infection  Pt comes in with weakness. Doesn't sound like flu. We started extensive workup - and results are reassuring, including CXR, trop, EKG, UA. K is slightly low. Tried to ambulate patient, but she was too weak. Pt resides at SNF -so had a conversation with the grand daughter about being conservative and discharging her to SNF, pt can return to the ER if progressively worsening. After thinking over the idea, family would want an obs admission until flu results, blood cultures come back. Will admit. Labs are reassuring.  Varney Biles, MD 04/04/15 [phone]

## 2015-04-03 NOTE — H&P (Signed)
[facility] History and Physical  CEAZIA PATRICK [id number] DOB: 05-May-1923 DOA: 03/14/2015  Referring physician: Varney Biles, MD PCP: Marjorie Smolder, MD   Chief Complaint: Weakness and loss of appetite.  HPI: Lori Long is a 82 y.o. female with below past medical history who was recently discharged from the hospital after spending 6 days being treated with IV antibiotics for submandibular abscess/suppurative lymphadenitis secondary to carcinoma of the head and neck and is coming to the hospital due to weakness, poor appetite, difficulty voiding, inability to take her medication and worsening mental status.  In the ER, patient was noticed to have about a liter of urine and a bladder scan so a Foley catheter was placed. Workup is significant for hypokalemia, hyperglycemia and mild anemia.   Review of Systems:  Unable to review due to the patient's mental status.   Past Medical History  Diagnosis Date  . Coronary artery disease   . Hypertension   . Arthritis   . Diabetes mellitus   . Renal disorder   . Sleep apnea   . Thyroid disease   . Glaucoma   . Osteopenia   . Elevated cholesterol   . Back pain   . PUD (peptic ulcer disease)   . Macular degeneration   . Typhoid fever   . COPD (chronic obstructive pulmonary disease) ([place])   . Dementia   . Back pain   . HCAP (healthcare-associated pneumonia) 07/20/2014   Past Surgical History  Procedure Laterality Date  . Cholecystectomy    . Abdominal hysterectomy    . Cataract extraction    . Back surgery    . Thyroid surgery    . Incision and drainage abscess Right 03/21/2015    Procedure: INCISION AND DRAINAGE ABSCESS;  Surgeon: Melissa Montane, MD;  Location: [facility];  Service: ENT;  Laterality: Right;  Incision and drainage right submandibular abcess   Social History:  reports that she has never smoked. She does not have any smokeless tobacco history on file. She reports that she does not drink alcohol or use  illicit drugs.  Allergies  Allergen Reactions  . Clonidine Derivatives     Local reaction to patch.  . Ezetimibe-Simvastatin Other (See Comments)    Leg pain from Vytorin  . Lipitor [Atorvastatin Calcium] Other (See Comments)    Couldn't tolerate  . Metformin And Related Diarrhea  . Morphine And Related Other (See Comments)    hallucination  . Codeine Rash    Family History  Problem Relation Age of Onset  . Leukemia Mother   . Pneumonia Father   . Diabetes Brother     Prior to Admission medications   Medication Sig Start Date End Date Taking? Authorizing Provider  acetaminophen (TYLENOL) 650 MG CR tablet Take 1,300 mg by mouth 2 (two) times daily as needed for pain.    Yes Historical Provider, MD  albuterol (PROVENTIL HFA;VENTOLIN HFA) 108 (90 BASE) MCG/ACT inhaler Inhale 2 puffs into the lungs every 6 (six) hours as needed for wheezing or shortness of breath. 08/22/14  Yes Waldemar Dickens, MD  amLODipine (NORVASC) 5 MG tablet Take 5 mg by mouth every morning.   Yes Historical Provider, MD  brimonidine-timolol (COMBIGAN) 0.2-0.5 % ophthalmic solution Place 1 drop into both eyes daily.    Yes Historical Provider, MD  cholecalciferol (VITAMIN D) 1000 UNITS tablet Take 1,000 Units by mouth daily.    Yes Historical Provider, MD  clonazePAM (KLONOPIN) 0.5 MG tablet Take 0.5 mg by mouth  at bedtime.   Yes Historical Provider, MD  cloNIDine (CATAPRES) 0.1 MG tablet Take 1 tablet by mouth at bedtime. 04/08/14  Yes Historical Provider, MD  docusate sodium (COLACE) 100 MG capsule Take 200 mg by mouth 2 (two) times daily.    Yes Historical Provider, MD  ferrous sulfate 325 (65 FE) MG tablet Take 325 mg by mouth every other day.   Yes Historical Provider, MD  fish oil-omega-3 fatty acids 1000 MG capsule Take 1 g by mouth every morning.    Yes Historical Provider, MD  gabapentin (NEURONTIN) 600 MG tablet Take 600 mg by mouth 3 (three) times daily.   Yes Historical Provider, MD  glipiZIDE  (GLUCOTROL XL) 10 MG 24 hr tablet Take 10 mg by mouth daily with breakfast.   Yes Historical Provider, MD  hydrALAZINE (APRESOLINE) 10 MG tablet Take 1 tablet (10 mg total) by mouth 3 (three) times daily. 03/25/15  Yes Annita Brod, MD  levothyroxine (SYNTHROID, LEVOTHROID) 50 MCG tablet Take 50 mcg by mouth daily before breakfast.   Yes Historical Provider, MD  memantine (NAMENDA) 5 MG tablet Take 5 mg by mouth 2 (two) times daily.   Yes Historical Provider, MD  metoprolol tartrate (LOPRESSOR) 25 MG tablet Take 25 mg by mouth 2 (two) times daily.  02/18/13  Yes Historical Provider, MD  Multiple Vitamins-Minerals (PRESERVISION/LUTEIN PO) Take 2 tablets by mouth every morning.    Yes Historical Provider, MD  omeprazole (PRILOSEC) 20 MG capsule Take 40 mg by mouth daily.   Yes Historical Provider, MD  pravastatin (PRAVACHOL) 40 MG tablet Take 40 mg by mouth every evening.    Yes Historical Provider, MD  Propylene Glycol (SYSTANE BALANCE) 0.6 % SOLN Place 1 drop into both eyes daily.    Yes Historical Provider, MD  traMADol (ULTRAM) 50 MG tablet Take 2 tablets (100 mg total) by mouth 3 (three) times daily. 03/25/15  Yes Annita Brod, MD  valsartan (DIOVAN) 320 MG tablet Take 320 mg by mouth daily.   Yes Historical Provider, MD   Physical Exam: Filed Vitals:   03/18/2015 1650 04/08/2015 1800 04/04/2015 2011 04/05/2015 2046  BP: 168/63 179/74 187/98 175/99  Pulse: 96 94 100   Temp:   97.6 F (36.4 C)   TempSrc:   Oral   Resp: 22 17 20    Height:   5\' 6"  (1.676 m)   Weight:   68.176 kg (150 lb 4.8 oz)   SpO2: 94% 92% 96%     Wt Readings from Last 3 Encounters:  03/19/2015 68.176 kg (150 lb 4.8 oz)  03/20/15 71.7 kg (158 lb 1.1 oz)  07/21/14 70.9 kg (156 lb 4.9 oz)    General:  Appears calm and comfortable Eyes: PERRL, normal lids, irises & conjunctiva ENT: grossly normal hearing, lips & tongue Neck: Positive right facial and cervical induration, with minimal edema, mild erythema and  tenderness. Incision from previous I&D does not show any drainage. Cardiovascular: RRR, no m/r/g. No LE edema. Telemetry:  Respiratory: CTA bilaterally, no w/r/r. Normal respiratory effort. Abdomen: soft, ntnd Skin: no rash or induration seen on limited exam Musculoskeletal: grossly normal tone BUE/BLE Psychiatric: Confused and only answers yes or no questions. Neurologic: Confused, oriented to name only, moves all extremities.          Labs on Admission:  Basic Metabolic Panel:  Recent Labs Lab 04/07/2015 1335  NA 138  K 2.8*  CL 98*  CO2 27  GLUCOSE 200*  BUN 8  CREATININE  0.89  CALCIUM 8.1*   Liver Function Tests:  Recent Labs Lab 04/02/2015 1335  AST 20  ALT 13*  ALKPHOS 69  BILITOT 0.6  PROT 6.4*  ALBUMIN 3.2*    Recent Labs Lab 04/05/2015 1335  LIPASE 24   CBC:  Recent Labs Lab 03/25/2015 1335  WBC 8.4  HGB 11.6*  HCT 35.8*  MCV 91.3  PLT 269   Cardiac Enzymes:  Recent Labs Lab 03/14/2015 1557  TROPONINI <0.03   Radiological Exams on Admission: Dg Chest 2 View  03/31/2015  CLINICAL DATA:  Weakness EXAM: CHEST  2 VIEW COMPARISON:  12/24/2014 FINDINGS: Cardiomediastinal silhouette is stable. No acute infiltrate or pleural effusion. No pulmonary edema. Osteopenia and mild degenerative changes thoracic spine. Atherosclerotic calcifications of thoracic aorta. IMPRESSION: No active disease. Osteopenia and mild degenerative changes thoracic spine. Electronically Signed   By: Lahoma Crocker M.D.   On: 03/21/2015 15:51    EKG: Independently reviewed.  Vent. rate 103 BPM PR interval * ms QRS duration 75 ms QT/QTc 386/505 ms P-R-T axes -1 34 31 Sinus rhythm Anteroseptal infarct, age indeterminate Prolonged QT interval No significant change since last tracing  Assessment/Plan Principal Problem:   Hypokalemia Admit to telemetry. Continue potassium supplementation. Check magnesium and phosphorus levels. Recheck potassium level in the morning. Keep  nothing by mouth until the patient's mental status improved.  Active Problems:   Suppurative lymphadenitis   Carcinoma of head and neck Start Unasyn 3 g every 6 hours. Consider ENT reevaluation if no improvement.    Hypertension Metoprolol and hydralazine IVP until able to resume oral medications. Monitor blood pressure closely.    DM II (diabetes mellitus, type II), controlled ([place]) CBG monitoring every 6 hours while nothing by mouth. CBG monitoring with regular insulin sliding scale before meals once tolerating fluid intake.    COPD (chronic obstructive pulmonary disease) (HCC) Stable. Bronchodilators as needed.    Dementia Per granddaughter, he has worsened since she is residing at the skilled nursing facility since last month and has attributed them more over the past 2-3 days. Provide supportive care.      Code Status: DO NOT RESUSCITATE. DVT Prophylaxis: Lovenox Family Communication: Her granddaughter [name] was present in the room. Disposition Plan: Admit for IV antibiotic therapy.  Time spent: Over 70 minutes were spent during the process of this admission.  Reubin Milan, M.D. [facility] Pager [phone].

## 2015-04-04 DIAGNOSIS — L8990 Pressure ulcer of unspecified site, unspecified stage: Secondary | ICD-10-CM

## 2015-04-04 DIAGNOSIS — I10 Essential (primary) hypertension: Secondary | ICD-10-CM

## 2015-04-04 DIAGNOSIS — E119 Type 2 diabetes mellitus without complications: Secondary | ICD-10-CM | POA: Diagnosis not present

## 2015-04-04 DIAGNOSIS — C760 Malignant neoplasm of head, face and neck: Secondary | ICD-10-CM

## 2015-04-04 DIAGNOSIS — J418 Mixed simple and mucopurulent chronic bronchitis: Secondary | ICD-10-CM | POA: Diagnosis not present

## 2015-04-04 DIAGNOSIS — E43 Unspecified severe protein-calorie malnutrition: Secondary | ICD-10-CM

## 2015-04-04 DIAGNOSIS — E876 Hypokalemia: Secondary | ICD-10-CM | POA: Diagnosis not present

## 2015-04-04 LAB — COMPREHENSIVE METABOLIC PANEL
ALBUMIN: 2.7 g/dL — AB (ref 3.5–5.0)
ALK PHOS: 57 U/L (ref 38–126)
ALT: 12 U/L — ABNORMAL LOW (ref 14–54)
ANION GAP: 10 (ref 5–15)
AST: 20 U/L (ref 15–41)
BILIRUBIN TOTAL: 0.5 mg/dL (ref 0.3–1.2)
BUN: 7 mg/dL (ref 6–20)
CALCIUM: 7.4 mg/dL — AB (ref 8.9–10.3)
CO2: 29 mmol/L (ref 22–32)
Chloride: 104 mmol/L (ref 101–111)
Creatinine, Ser: 0.92 mg/dL (ref 0.44–1.00)
GFR calc Af Amer: >60 mL/min (ref >60)
GFR calc non Af Amer: 54 mL/min — ABNORMAL LOW (ref >60)
GLUCOSE: 154 mg/dL — AB (ref 65–99)
Potassium: 3 mmol/L — ABNORMAL LOW (ref 3.5–5.1)
SODIUM: 143 mmol/L (ref 135–145)
TOTAL PROTEIN: 5.5 g/dL — AB (ref 6.5–8.1)

## 2015-04-04 LAB — CBC WITH DIFFERENTIAL/PLATELET
BASOS ABS: 0 10*3/uL (ref 0.0–0.1)
BASOS PCT: 0 %
EOS ABS: 0.2 10*3/uL (ref 0.0–0.7)
Eosinophils Relative: 3 %
HEMATOCRIT: 30.7 % — AB (ref 36.0–46.0)
HEMOGLOBIN: 9.9 g/dL — AB (ref 12.0–15.0)
Lymphocytes Relative: 38 %
Lymphs Abs: 3.1 10*3/uL (ref 0.7–4.0)
MCH: 29.6 pg (ref 26.0–34.0)
MCHC: 32.2 g/dL (ref 30.0–36.0)
MCV: 91.9 fL (ref 78.0–100.0)
Monocytes Absolute: 1.1 10*3/uL — ABNORMAL HIGH (ref 0.1–1.0)
Monocytes Relative: 14 %
NEUTROS ABS: 3.6 10*3/uL (ref 1.7–7.7)
NEUTROS PCT: 45 %
Platelets: 241 10*3/uL (ref 150–400)
RBC: 3.34 MIL/uL — AB (ref 3.87–5.11)
RDW: 13.3 % (ref 11.5–15.5)
WBC: 8.1 10*3/uL (ref 4.0–10.5)

## 2015-04-04 LAB — PHOSPHORUS: Phosphorus: 2.2 mg/dL — ABNORMAL LOW (ref 2.5–4.6)

## 2015-04-04 LAB — URINE CULTURE: CULTURE: NO GROWTH

## 2015-04-04 LAB — MAGNESIUM: Magnesium: 1.4 mg/dL — ABNORMAL LOW (ref 1.7–2.4)

## 2015-04-04 LAB — GLUCOSE, CAPILLARY
GLUCOSE-CAPILLARY: 141 mg/dL — AB (ref 65–99)
GLUCOSE-CAPILLARY: 189 mg/dL — AB (ref 65–99)
Glucose-Capillary: 142 mg/dL — ABNORMAL HIGH (ref 65–99)
Glucose-Capillary: 156 mg/dL — ABNORMAL HIGH (ref 65–99)

## 2015-04-04 MED ORDER — HYDROCODONE-ACETAMINOPHEN 5-325 MG PO TABS
1.0000 | ORAL_TABLET | Freq: Four times a day (QID) | ORAL | Status: DC | PRN
  Administered 2015-04-04: 1 via ORAL
  Filled 2015-04-04: qty 1

## 2015-04-04 MED ORDER — ONDANSETRON HCL 4 MG/2ML IJ SOLN
4.0000 mg | Freq: Four times a day (QID) | INTRAMUSCULAR | Status: DC | PRN
  Administered 2015-04-05: 4 mg via INTRAVENOUS
  Filled 2015-04-04: qty 2

## 2015-04-04 MED ORDER — TRAMADOL HCL 50 MG PO TABS
50.0000 mg | ORAL_TABLET | Freq: Once | ORAL | Status: AC
  Administered 2015-04-04: 50 mg via ORAL
  Filled 2015-04-04: qty 1

## 2015-04-04 MED ORDER — AMLODIPINE BESYLATE 5 MG PO TABS
5.0000 mg | ORAL_TABLET | Freq: Every day | ORAL | Status: DC
Start: 2015-04-04 — End: 2015-04-05
  Administered 2015-04-04 – 2015-04-05 (×2): 5 mg via ORAL
  Filled 2015-04-04 (×2): qty 1

## 2015-04-04 MED ORDER — HYDRALAZINE HCL 20 MG/ML IJ SOLN
10.0000 mg | INTRAMUSCULAR | Status: DC | PRN
Start: 2015-04-04 — End: 2015-04-05
  Administered 2015-04-04 (×2): 10 mg via INTRAVENOUS
  Filled 2015-04-04 (×3): qty 1

## 2015-04-04 MED ORDER — ONDANSETRON HCL 4 MG/2ML IJ SOLN
INTRAMUSCULAR | Status: AC
  Administered 2015-04-04: 4 mg
  Filled 2015-04-04: qty 2

## 2015-04-04 MED ORDER — TRAMADOL HCL 50 MG PO TABS
50.0000 mg | ORAL_TABLET | Freq: Three times a day (TID) | ORAL | Status: DC | PRN
  Administered 2015-04-05: 50 mg via ORAL
  Filled 2015-04-04: qty 1

## 2015-04-04 MED ORDER — MAGNESIUM SULFATE 4 GM/100ML IV SOLN
4.0000 g | Freq: Once | INTRAVENOUS | Status: AC
  Administered 2015-04-04: 4 g via INTRAVENOUS
  Filled 2015-04-04: qty 100

## 2015-04-04 MED ORDER — IBUPROFEN 400 MG PO TABS: 400.0000 mg | ORAL_TABLET | Freq: Once | ORAL | Status: DC

## 2015-04-04 MED ORDER — HYDROCODONE-ACETAMINOPHEN 5-325 MG PO TABS
1.0000 | ORAL_TABLET | Freq: Once | ORAL | Status: AC | PRN
  Administered 2015-04-04: 1 via ORAL
  Filled 2015-04-04: qty 1

## 2015-04-04 NOTE — Progress Notes (Signed)
[facility] PROGRESS NOTE   Lori Long [id number] DOB: February 22, 1923 DOA: 04/04/2015 PCP: Marjorie Smolder, MD  HPI/Subjective: Patient is sleepy, discussed with granddaughter [name] at bedside.  Assessment/Plan: Principal Problem:   Hypokalemia Active Problems:   Hypertension   DM II (diabetes mellitus, type II), controlled (HCC)   COPD (chronic obstructive pulmonary disease) (HCC)   Dementia   Suppurative lymphadenitis   Carcinoma of head and neck   Pressure ulcer   Protein-calorie malnutrition, severe   Suppurative lymphadenitis Carcinoma of head and neck Start Unasyn 3 g every 6 hours. Poorly differentiated carcinoma was just diagnosed on 03/21/15. Discussed with the granddaughter at bedside she is a POA for healthcare, she is okay with palliative. I will call palliative for consultation.  Failure to thrive and adult/severe protein calorie malnutrition Presented with somnolence and poor oral intake. Patient was recently diagnosed with poorly differentiated carcinoma of the head and neck. Denies any pain with chewing or swallowing but she is still not able to eat.  Hypokalemia and hypomagnesemia Potassium of 3.0 and magnesium of 1.4, both to be repleted parenterally.  Hypertension Metoprolol and hydralazine IVP until able to resume oral medications. Monitor blood pressure closely.  DM II (diabetes mellitus, type II), controlled ([place]) CBG monitoring every 6 hours while nothing by mouth. CBG monitoring with regular insulin sliding scale before meals once tolerating fluid intake.  COPD (chronic obstructive pulmonary disease) (HCC) Stable. Bronchodilators as needed.  Dementia Per granddaughter, he has worsened since she is residing at the skilled nursing facility since last month and has attributed them more over the past 2-3 days. Provide supportive care.   Code Status: DNR Family Communication: Plan discussed with the patient. Disposition Plan:  Remains inpatient Diet: Diet regular Room service appropriate?: Yes; Fluid consistency:: Thin  Consultants:  None  Procedures:  None  Antibiotics:  None (indicate start date, and stop date if known)   Objective: Filed Vitals:   04/04/15 0657 04/04/15 1127  BP: 159/65 193/85  Pulse:    Temp:    Resp:      Intake/Output Summary (Last 24 hours) at 04/04/15 1215 Last data filed at 04/04/15 0840  Gross per 24 hour  Intake 826.67 ml  Output   1675 ml  Net -848.33 ml   Filed Weights   04/07/2015 1552 04/02/2015 2011  Weight: 71.668 kg (158 lb) 68.176 kg (150 lb 4.8 oz)    Exam: General: Alert and awake, oriented x3, not in any acute distress. HEENT: anicteric sclera, pupils reactive to light and accommodation, EOMI CVS: S1-S2 clear, no murmur rubs or gallops Chest: clear to auscultation bilaterally, no wheezing, rales or rhonchi Abdomen: soft nontender, nondistended, normal bowel sounds, no organomegaly Extremities: no cyanosis, clubbing or edema noted bilaterally Neuro: Cranial nerves II-XII intact, no focal neurological deficits  Data Reviewed: Basic Metabolic Panel:  Recent Labs Lab 03/24/2015 1335 03/20/2015 2234 04/04/15 0528  NA 138  --  143  K 2.8*  --  3.0*  CL 98*  --  104  CO2 27  --  29  GLUCOSE 200*  --  154*  BUN 8  --  7  CREATININE 0.89  --  0.92  CALCIUM 8.1*  --  7.4*  MG  --  1.4*  --   PHOS  --  2.2*  --    Liver Function Tests:  Recent Labs Lab 03/21/2015 1335 04/04/15 0528  AST 20 20  ALT 13* 12*  ALKPHOS 69 57  BILITOT 0.6 0.5  PROT 6.4* 5.5*  ALBUMIN 3.2* 2.7*    Recent Labs Lab 03/14/2015 1335  LIPASE 24   No results for input(s): AMMONIA in the last 168 hours. CBC:  Recent Labs Lab 04/05/2015 1335 04/04/15 0528  WBC 8.4 8.1  NEUTROABS  --  3.6  HGB 11.6* 9.9*  HCT 35.8* 30.7*  MCV 91.3 91.9  PLT 269 241   Cardiac Enzymes:  Recent Labs Lab 03/18/2015 1557  TROPONINI <0.03   BNP (last 3 results) No results for  input(s): BNP in the last 8760 hours.  ProBNP (last 3 results) No results for input(s): PROBNP in the last 8760 hours.  CBG:  Recent Labs Lab 04/04/15 0108 04/04/15 0652 04/04/15 0752  GLUCAP 142* 141* 156*    Micro No results found for this or any previous visit (from the past 240 hour(s)).   Studies: Dg Chest 2 View  04/01/2015  CLINICAL DATA:  Weakness EXAM: CHEST  2 VIEW COMPARISON:  12/24/2014 FINDINGS: Cardiomediastinal silhouette is stable. No acute infiltrate or pleural effusion. No pulmonary edema. Osteopenia and mild degenerative changes thoracic spine. Atherosclerotic calcifications of thoracic aorta. IMPRESSION: No active disease. Osteopenia and mild degenerative changes thoracic spine. Electronically Signed   By: Lahoma Crocker M.D.   On: 04/07/2015 15:51    Scheduled Meds: . ampicillin-sulbactam (UNASYN) IV  3 g Intravenous Q6H  . enoxaparin (LOVENOX) injection  40 mg Subcutaneous Q24H  . feeding supplement (ENSURE ENLIVE)  237 mL Oral BID BM  . metoprolol  5 mg Intravenous 4 times per day  . pantoprazole (PROTONIX) IV  40 mg Intravenous Q24H  . sodium chloride flush  3 mL Intravenous Q12H   Continuous Infusions: . 0.9 % NaCl with KCl 40 mEq / L 50 mL/hr (03/24/2015 2252)       Time spent: 35 minutes    [facility]  [facility] Pager [phone] If 7PM-7AM, please contact night-coverage at [email or web address], password [facility] 04/04/2015, 12:15 PM

## 2015-04-04 NOTE — Care Management Note (Signed)
Case Management Note  Patient Details  Name: Lori Long MRN: [id number] Date of Birth: May 20, 1923  Subjective/Objective: 82 y/o f admitted w/hypokalemia. Hx:DNR,dementia.From home-[facility].Palliative cons-await recc.                   Action/Plan:d/c plan SNF.   Expected Discharge Date:   (unknown)               Expected Discharge Plan:  Skilled Nursing Facility  In-House Referral:  Clinical Social Work  Discharge planning Services  CM Consult  Post Acute Care Choice:    Choice offered to:     DME Arranged:    DME Agency:     HH Arranged:    [place] Agency:     Status of Service:  In process, will continue to follow  Medicare Important Message Given:    Date Medicare IM Given:    Medicare IM give by:    Date Additional Medicare IM Given:    Additional Medicare Important Message give by:     If discussed at [place] of Stay Meetings, dates discussed:    Additional Comments:  Dessa Phi, RN 04/04/2015, 12:38 PM

## 2015-04-04 NOTE — Progress Notes (Signed)
Initial Nutrition Assessment  DOCUMENTATION CODES:   Severe malnutrition in context of acute illness/injury  INTERVENTION:  - Continue Ensure Enlive BID, 350 kcal, 20 grams of protein per serving -Provide mighty shakes BID, 500 kcal, 23 grams of protein per serving  NUTRITION DIAGNOSIS:   Inadequate oral intake related to poor appetite as evidenced by per patient/family report, meal completion < 25%, energy intake < or equal to 50% for > or equal to 5 days.  GOAL:   Patient will meet greater than or equal to 90% of their needs  MONITOR:   PO intake, Supplement acceptance, Labs, Weight trends  REASON FOR ASSESSMENT:   Malnutrition Screening Tool  ASSESSMENT:   Pt with PMH of hypertension, CAD, diabetes mellitus, renal disorder, osteopenia, elevated cholesterol, COPD, and  Dementia. Pt was recently discharged from the hospital after spending 6 days being treated with IV antibiotics for submandibular abscess/suppurative lymphadenitis secondary to carcinoma of the head and neck and is coming to the hospital due to weakness, poor appetite, difficulty voiding, inability to take her medication and worsening mental status.   Pt seen for MST. Per chart pt consumed 10% of her breakfast this morning. Spoke with pt's daughter, reports the pt only consumed a few bites of pancakes and eggs, she did drink a carton of milk for breakfast this morning. Pt's daughter reports that pt is consuming 20%-30% of what her usual intake is. This decrease in PO intake has been occuring for 1 week. Pt's daughter reports the decrease intake is not due to pain, but no desire to eat. PTA pt was consuming Premier Protein shakes, only few sips few times a day, and mighty shakes. Pt's daughter was agreeable to continue Ensure BID and will provide mighty shakes BID to help meet pt's nutritional needs.   Pt's daughter reports pt looks like she has lost weight but unable to provide an amount or time frame. Per chart pt has  lost 8 lb (6% body weight) within one month, which is significant for time frame.   2/13 -seen by RD; PO intake 0-35% at that time. Due to PO intake <50% for > 5 days and 6% weight loss, pt is classified as severely malnourished in the context of an acute illness.    Medications reviewed. Labs reviewed; potassium 3.0  Diet Order:  Diet regular Room service appropriate?: Yes; Fluid consistency:: Thin  Skin:  Wound (see comment) (Stage I pressure uler-coccyx, Incision on right side of face)  Last BM:  03/23/2015  Height:   Ht Readings from Last 1 Encounters:  03/29/2015 5\' 6"  (1.676 m)    Weight:   Wt Readings from Last 1 Encounters:  03/13/2015 150 lb 4.8 oz (68.176 kg)    Ideal Body Weight:  59 kg  BMI:  Body mass index is 24.27 kg/(m^2).  Estimated Nutritional Needs:   Kcal:  [id number]  Protein:  80-90 grams  Fluid:  1.8- 2 L  EDUCATION NEEDS:   No education needs identified at this time  [place], Dietetic Intern Pager: [phone]

## 2015-04-05 DIAGNOSIS — E1165 Type 2 diabetes mellitus with hyperglycemia: Secondary | ICD-10-CM | POA: Diagnosis present

## 2015-04-05 DIAGNOSIS — Z8711 Personal history of peptic ulcer disease: Secondary | ICD-10-CM | POA: Diagnosis not present

## 2015-04-05 DIAGNOSIS — Z806 Family history of leukemia: Secondary | ICD-10-CM | POA: Diagnosis not present

## 2015-04-05 DIAGNOSIS — I10 Essential (primary) hypertension: Secondary | ICD-10-CM | POA: Diagnosis present

## 2015-04-05 DIAGNOSIS — E7800 Pure hypercholesterolemia, unspecified: Secondary | ICD-10-CM | POA: Diagnosis present

## 2015-04-05 DIAGNOSIS — Z515 Encounter for palliative care: Secondary | ICD-10-CM | POA: Diagnosis present

## 2015-04-05 DIAGNOSIS — Z833 Family history of diabetes mellitus: Secondary | ICD-10-CM | POA: Diagnosis not present

## 2015-04-05 DIAGNOSIS — H3530 Unspecified macular degeneration: Secondary | ICD-10-CM | POA: Diagnosis present

## 2015-04-05 DIAGNOSIS — Z888 Allergy status to other drugs, medicaments and biological substances status: Secondary | ICD-10-CM | POA: Diagnosis not present

## 2015-04-05 DIAGNOSIS — R410 Disorientation, unspecified: Secondary | ICD-10-CM | POA: Diagnosis present

## 2015-04-05 DIAGNOSIS — C779 Secondary and unspecified malignant neoplasm of lymph node, unspecified: Secondary | ICD-10-CM | POA: Diagnosis present

## 2015-04-05 DIAGNOSIS — Z7984 Long term (current) use of oral hypoglycemic drugs: Secondary | ICD-10-CM | POA: Diagnosis not present

## 2015-04-05 DIAGNOSIS — J418 Mixed simple and mucopurulent chronic bronchitis: Secondary | ICD-10-CM | POA: Diagnosis not present

## 2015-04-05 DIAGNOSIS — H409 Unspecified glaucoma: Secondary | ICD-10-CM | POA: Diagnosis present

## 2015-04-05 DIAGNOSIS — D649 Anemia, unspecified: Secondary | ICD-10-CM | POA: Diagnosis present

## 2015-04-05 DIAGNOSIS — R627 Adult failure to thrive: Secondary | ICD-10-CM | POA: Diagnosis present

## 2015-04-05 DIAGNOSIS — I2510 Atherosclerotic heart disease of native coronary artery without angina pectoris: Secondary | ICD-10-CM | POA: Diagnosis present

## 2015-04-05 DIAGNOSIS — L049 Acute lymphadenitis, unspecified: Secondary | ICD-10-CM | POA: Diagnosis present

## 2015-04-05 DIAGNOSIS — L8990 Pressure ulcer of unspecified site, unspecified stage: Secondary | ICD-10-CM | POA: Diagnosis present

## 2015-04-05 DIAGNOSIS — E876 Hypokalemia: Secondary | ICD-10-CM | POA: Diagnosis not present

## 2015-04-05 DIAGNOSIS — R531 Weakness: Secondary | ICD-10-CM | POA: Diagnosis present

## 2015-04-05 DIAGNOSIS — Z789 Other specified health status: Secondary | ICD-10-CM | POA: Diagnosis not present

## 2015-04-05 DIAGNOSIS — J449 Chronic obstructive pulmonary disease, unspecified: Secondary | ICD-10-CM | POA: Diagnosis present

## 2015-04-05 DIAGNOSIS — Z66 Do not resuscitate: Secondary | ICD-10-CM | POA: Diagnosis present

## 2015-04-05 DIAGNOSIS — Z79899 Other long term (current) drug therapy: Secondary | ICD-10-CM | POA: Diagnosis not present

## 2015-04-05 DIAGNOSIS — R451 Restlessness and agitation: Secondary | ICD-10-CM | POA: Diagnosis present

## 2015-04-05 DIAGNOSIS — E119 Type 2 diabetes mellitus without complications: Secondary | ICD-10-CM | POA: Diagnosis not present

## 2015-04-05 DIAGNOSIS — G4730 Sleep apnea, unspecified: Secondary | ICD-10-CM | POA: Diagnosis present

## 2015-04-05 DIAGNOSIS — C760 Malignant neoplasm of head, face and neck: Secondary | ICD-10-CM | POA: Diagnosis not present

## 2015-04-05 DIAGNOSIS — M8580 Other specified disorders of bone density and structure, unspecified site: Secondary | ICD-10-CM | POA: Diagnosis present

## 2015-04-05 DIAGNOSIS — Z79891 Long term (current) use of opiate analgesic: Secondary | ICD-10-CM | POA: Diagnosis not present

## 2015-04-05 DIAGNOSIS — Z885 Allergy status to narcotic agent status: Secondary | ICD-10-CM | POA: Diagnosis not present

## 2015-04-05 DIAGNOSIS — E43 Unspecified severe protein-calorie malnutrition: Secondary | ICD-10-CM | POA: Diagnosis present

## 2015-04-05 DIAGNOSIS — F0390 Unspecified dementia without behavioral disturbance: Secondary | ICD-10-CM | POA: Diagnosis present

## 2015-04-05 LAB — CBC WITH DIFFERENTIAL/PLATELET
Basophils Absolute: 0 10*3/uL (ref 0.0–0.1)
Basophils Relative: 0 %
EOS ABS: 0.5 10*3/uL (ref 0.0–0.7)
Eosinophils Relative: 5 %
HCT: 35.8 % — ABNORMAL LOW (ref 36.0–46.0)
HEMOGLOBIN: 11.4 g/dL — AB (ref 12.0–15.0)
LYMPHS ABS: 2.7 10*3/uL (ref 0.7–4.0)
LYMPHS PCT: 27 %
MCH: 29.6 pg (ref 26.0–34.0)
MCHC: 31.8 g/dL (ref 30.0–36.0)
MCV: 93 fL (ref 78.0–100.0)
Monocytes Absolute: 1 10*3/uL (ref 0.1–1.0)
Monocytes Relative: 11 %
NEUTROS PCT: 57 %
Neutro Abs: 5.6 10*3/uL (ref 1.7–7.7)
Platelets: 255 10*3/uL (ref 150–400)
RBC: 3.85 MIL/uL — AB (ref 3.87–5.11)
RDW: 13.6 % (ref 11.5–15.5)
WBC: 9.8 10*3/uL (ref 4.0–10.5)

## 2015-04-05 LAB — COMPREHENSIVE METABOLIC PANEL
ALK PHOS: 73 U/L (ref 38–126)
ALT: 96 U/L — AB (ref 14–54)
AST: 20 U/L (ref 15–41)
Albumin: 3 g/dL — ABNORMAL LOW (ref 3.5–5.0)
Anion gap: 10 (ref 5–15)
BUN: 8 mg/dL (ref 6–20)
CALCIUM: 7.8 mg/dL — AB (ref 8.9–10.3)
CO2: 29 mmol/L (ref 22–32)
CREATININE: 0.88 mg/dL (ref 0.44–1.00)
Chloride: 104 mmol/L (ref 101–111)
GFR calc non Af Amer: 57 mL/min — ABNORMAL LOW (ref >60)
Glucose, Bld: 164 mg/dL — ABNORMAL HIGH (ref 65–99)
Potassium: 3 mmol/L — ABNORMAL LOW (ref 3.5–5.1)
SODIUM: 143 mmol/L (ref 135–145)
Total Bilirubin: 0.5 mg/dL (ref 0.3–1.2)
Total Protein: 6 g/dL — ABNORMAL LOW (ref 6.5–8.1)

## 2015-04-05 LAB — MAGNESIUM: Magnesium: 1.7 mg/dL (ref 1.7–2.4)

## 2015-04-05 LAB — GLUCOSE, CAPILLARY
GLUCOSE-CAPILLARY: 138 mg/dL — AB (ref 65–99)
GLUCOSE-CAPILLARY: 169 mg/dL — AB (ref 65–99)

## 2015-04-05 MED ORDER — SODIUM CHLORIDE 0.9 % IV SOLN
0.5000 mg/h | INTRAVENOUS | Status: DC
  Administered 2015-04-05: 0.5 mg/h via INTRAVENOUS
  Filled 2015-04-05: qty 2.5

## 2015-04-05 MED ORDER — HYDROMORPHONE HCL 1 MG/ML IJ SOLN
1.0000 mg | Freq: Once | INTRAMUSCULAR | Status: AC
  Administered 2015-04-05: 1 mg via INTRAVENOUS
  Filled 2015-04-05: qty 1

## 2015-04-05 MED ORDER — SCOPOLAMINE 1 MG/3DAYS TD PT72
1.0000 | MEDICATED_PATCH | TRANSDERMAL | Status: DC
  Administered 2015-04-05: 1.5 mg via TRANSDERMAL
  Filled 2015-04-05: qty 1

## 2015-04-05 MED ORDER — HYDROMORPHONE HCL 1 MG/ML IJ SOLN
INTRAMUSCULAR | Status: AC
  Administered 2015-04-05: 1 mg
  Filled 2015-04-05: qty 1

## 2015-04-05 MED ORDER — SODIUM CHLORIDE 0.9 % IV SOLN
INTRAVENOUS | Status: DC
  Filled 2015-04-05 (×2): qty 1000

## 2015-04-05 MED ORDER — HYDROMORPHONE HCL 1 MG/ML IJ SOLN: 1.0000 mg | Freq: Once | INTRAMUSCULAR | Status: DC

## 2015-04-05 MED ORDER — SODIUM CHLORIDE 0.9 % IV SOLN: 2.0000 mg/h | INTRAVENOUS | Status: DC

## 2015-04-05 MED ORDER — POTASSIUM CHLORIDE CRYS ER 20 MEQ PO TBCR
40.0000 meq | EXTENDED_RELEASE_TABLET | Freq: Once | ORAL | Status: DC
  Filled 2015-04-05: qty 2

## 2015-04-05 MED ORDER — HYDROMORPHONE BOLUS VIA INFUSION
1.0000 mg | INTRAVENOUS | Status: DC | PRN
  Filled 2015-04-05: qty 1

## 2015-04-05 MED ORDER — DIAZEPAM 5 MG/ML IJ SOLN: 10.0000 mg | INTRAMUSCULAR | Status: DC | PRN

## 2015-04-05 MED ORDER — PANTOPRAZOLE SODIUM 40 MG PO TBEC: 40.0000 mg | DELAYED_RELEASE_TABLET | Freq: Every day | ORAL | Status: DC

## 2015-04-05 MED ORDER — MAGNESIUM SULFATE 2 GM/50ML IV SOLN: 2.0000 g | Freq: Once | INTRAVENOUS | Status: DC

## 2015-04-05 NOTE — Consult Note (Addendum)
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   Patient's daughter Lori Long is at bedside and [name] appeared on my arrival to be experiencing a pain crisis- I was called urgently by floor RN that patient could not get comfortable and family wanting palliative assistance. When I responded I found patient whimpering and moaning complaining of back pain and having severe tremors-distress level extremely high, her face was flushed and she was also diaphoretic.  [name] is extremely frail but has lived fairly independently and well up until her surgical biopsy about a month ago. Her daughter says that following that surgery her mother was never the same mentally or physically- subacute delirium, lower pain thresholds, poor PO intake and side effects from antibiotics. At this point the family does not want to pursue any additional medical interventions or put her through any diagnostics. They recognize this as her terminal decline.   Contacts/Participants in Discussion: Lori Long Primary Decision Maker: daughter   Relationship to Patient HCPOA HCPOA: yes   Palliative consult requested by family in the ED, consult released and placed 2/24 in afternoon and picked up 2/25 at 10:30AM by PMT.  SUMMARY OF RECOMMENDATIONS  Code Status/Advance Care Planning: DNR    Code Status Orders        Start     Ordered   04/02/2015 2219  Do not attempt  resuscitation (DNR)   Continuous    Question Answer Comment  In the event of cardiac or respiratory ARREST Do not call a "code blue"   In the event of cardiac or respiratory ARREST Do not perform Intubation, CPR, defibrillation or ACLS   In the event of cardiac or respiratory ARREST Use medication by any route, position, wound care, and other measures to relive pain and suffering. May use oxygen, suction and manual treatment of airway obstruction as needed for comfort.      04/02/2015 2218    Code Status History    Date Active Date Inactive Code Status Order ID Comments User Context   03/19/2015  3:51 PM 03/25/2015  7:54 PM DNR [id number]  Lori Haff, MD Inpatient   07/21/2014 12:39 AM 07/23/2014  3:54 PM DNR [id number]  Lori Mainland, DO Inpatient   04/24/2014  9:10 PM 04/26/2014  9:16 PM DNR [id number]  Lori Baker, MD Inpatient    Advance Directive Documentation        Most Recent Value   Type of Advance Directive  Healthcare Power of Attorney, Living will   Pre-existing out of facility DNR order (yellow form or pink MOST form)     "MOST" Form in Place?        Other Directives:None  Symptom Management:   Severe Pain (multifactorial)   2 bolus doses of IV dilaudid were given immediately once I arrived at the bedside- comfort achieved within apx 30 minutes  Dilaudid infusion at 0.5mg /hr started-patient has long history of severe spinal stenosis and has used opiates consistently in the past-although in low doses.  Throat secretions at drainage may be erosion of infection and tumor into  her throat? Copious- greenish black drainage in throat. May also be superimposed thrush with abx use of over 1 month. Will place scop patch  Nausea from throat Drainage;'    Started scop patch  Palliative Prophylaxis:   Aspiration  Additional Recommendations (Limitations, Scope, Preferences):  Full Comfort Care   Psycho-social/Spiritual:  Support System: Lori Long Desire for further Chaplaincy  support:yes Additional Recommendations: Education on Hospice  Prognosis: < 2 weeks  Discharge Planning: Hospice facility   Chief Complaint/ Primary Diagnoses: Present on Admission:  . Hypokalemia . Hypertension . COPD (chronic obstructive pulmonary disease) ([place]) . Dementia . Carcinoma of head and neck . Suppurative lymphadenitis  I have reviewed the medical record, interviewed the patient and family, and examined the patient. The following aspects are pertinent.  Past Medical History  Diagnosis Date  . Coronary artery disease   . Hypertension   . Arthritis   . Diabetes mellitus   . Renal disorder   . Sleep apnea   . Thyroid disease   . Glaucoma   . Osteopenia   . Elevated cholesterol   . Back pain   . PUD (peptic ulcer disease)   . Macular degeneration   . Typhoid fever   . COPD (chronic obstructive pulmonary disease) ([place])   . Dementia   . Back pain   . HCAP (healthcare-associated pneumonia) 07/20/2014   Social History   Social History  . Marital Status: Widowed    Spouse Name: N/A  . Number of Children: N/A  . Years of Education: N/A   Social History Main Topics  . Smoking status: Never Smoker   . Smokeless tobacco: None  . Alcohol Use: No  . Drug Use: No  . Sexual Activity: Not Asked   Other Topics Concern  . None   Social History Narrative   Family History  Problem Relation Age of Onset  . Leukemia Mother   . Pneumonia Father   . Diabetes Brother    Scheduled Meds: . feeding supplement (ENSURE ENLIVE)  237 mL Oral BID BM  .  HYDROmorphone (DILAUDID) injection  1 mg Intravenous Once  . potassium chloride  40 mEq Oral Once  . scopolamine  1 patch Transdermal Q72H  . sodium chloride flush  3 mL Intravenous Q12H   Continuous Infusions: . HYDROmorphone 0.5 mg/hr (04/05/15 1148)  . sodium chloride 0.9 % 1,000 mL with potassium chloride 20 mEq infusion     PRN Meds:.diazepam, HYDROmorphone, ondansetron Medications Prior to Admission:  Prior  to Admission medications   Medication Sig Start Date End Date Taking? Authorizing Provider  acetaminophen (TYLENOL) 650 MG CR tablet Take 1,300 mg by mouth 2 (two) times daily as needed for pain.    Yes Historical Provider, MD  albuterol (PROVENTIL HFA;VENTOLIN HFA) 108 (90 BASE) MCG/ACT inhaler Inhale 2 puffs into the lungs every 6 (six) hours as needed for wheezing or shortness of breath. 08/22/14  Yes Waldemar Dickens, MD  amLODipine (NORVASC) 5 MG tablet Take 5 mg by mouth every morning.   Yes Historical Provider, MD  brimonidine-timolol (COMBIGAN) 0.2-0.5 % ophthalmic solution Place 1 drop into both eyes daily.    Yes Historical Provider, MD  cholecalciferol (VITAMIN D) 1000 UNITS tablet Take 1,000 Units by mouth daily.    Yes Historical Provider, MD  clonazePAM (KLONOPIN) 0.5 MG tablet Take 0.5 mg by mouth at bedtime.   Yes Historical Provider, MD  cloNIDine (CATAPRES) 0.1 MG tablet Take 1 tablet by mouth at bedtime. 04/08/14  Yes Historical Provider,  MD  docusate sodium (COLACE) 100 MG capsule Take 200 mg by mouth 2 (two) times daily.    Yes Historical Provider, MD  ferrous sulfate 325 (65 FE) MG tablet Take 325 mg by mouth every other day.   Yes Historical Provider, MD  fish oil-omega-3 fatty acids 1000 MG capsule Take 1 g by mouth every morning.    Yes Historical Provider, MD  gabapentin (NEURONTIN) 600 MG tablet Take 600 mg by mouth 3 (three) times daily.   Yes Historical Provider, MD  glipiZIDE (GLUCOTROL XL) 10 MG 24 hr tablet Take 10 mg by mouth daily with breakfast.   Yes Historical Provider, MD  hydrALAZINE (APRESOLINE) 10 MG tablet Take 1 tablet (10 mg total) by mouth 3 (three) times daily. 03/25/15  Yes Annita Brod, MD  levothyroxine (SYNTHROID, LEVOTHROID) 50 MCG tablet Take 50 mcg by mouth daily before breakfast.   Yes Historical Provider, MD  memantine (NAMENDA) 5 MG tablet Take 5 mg by mouth 2 (two) times daily.   Yes Historical Provider, MD  metoprolol tartrate (LOPRESSOR) 25  MG tablet Take 25 mg by mouth 2 (two) times daily.  02/18/13  Yes Historical Provider, MD  Multiple Vitamins-Minerals (PRESERVISION/LUTEIN PO) Take 2 tablets by mouth every morning.    Yes Historical Provider, MD  omeprazole (PRILOSEC) 20 MG capsule Take 40 mg by mouth daily.   Yes Historical Provider, MD  pravastatin (PRAVACHOL) 40 MG tablet Take 40 mg by mouth every evening.    Yes Historical Provider, MD  Propylene Glycol (SYSTANE BALANCE) 0.6 % SOLN Place 1 drop into both eyes daily.    Yes Historical Provider, MD  traMADol (ULTRAM) 50 MG tablet Take 2 tablets (100 mg total) by mouth 3 (three) times daily. 03/25/15  Yes Annita Brod, MD  valsartan (DIOVAN) 320 MG tablet Take 320 mg by mouth daily.   Yes Historical Provider, MD   Allergies  Allergen Reactions  . Clonidine Derivatives     Local reaction to patch.  . Ezetimibe-Simvastatin Other (See Comments)    Leg pain from Vytorin  . Lipitor [Atorvastatin Calcium] Other (See Comments)    Couldn't tolerate  . Metformin And Related Diarrhea  . Morphine And Related Other (See Comments)    hallucination  . Codeine Rash    Review of Systems  Physical Exam  Vital Signs: BP 169/110 mmHg  Pulse 88  Temp(Src) 97.2 F (36.2 C) (Axillary)  Resp 18  Ht 5\' 6"  (1.676 m)  Wt 68.176 kg (150 lb 4.8 oz)  BMI 24.27 kg/m2  SpO2 96%  SpO2: SpO2: 96 % O2 Device:SpO2: 96 % O2 Flow Rate: .   IO: Intake/output summary:  Intake/Output Summary (Last 24 hours) at 04/05/15 1238 Last data filed at 04/04/15 2354  Gross per 24 hour  Intake    160 ml  Output   3025 ml  Net  -2865 ml    LBM: Last BM Date: 03/12/2015 Baseline Weight: Weight: 71.668 kg (158 lb) Most recent weight: Weight: 68.176 kg (150 lb 4.8 oz)      Palliative Assessment/Data:  Flowsheet Rows        Most Recent Value   Intake Tab    Referral Department  Hospitalist   Unit at Time of Referral  Cardiac/Telemetry Unit   Palliative Care Primary Diagnosis  Cancer   Date  Notified  04/04/15   Palliative Care Type  New Palliative care   Reason for referral  Pain, Clarify Goals of Care, [place]  Date of Admission  03/12/2015   # of days IP prior to Palliative referral  1   Clinical Assessment    Pain Max last 24 hours  10   Pain Min Last 24 hours  10   Dyspnea Max Last 24 Hours  0   Dyspnea Min Last 24 hours  0   Nausea Max Last 24 Hours  10   Nausea Min Last 24 Hours  10   Anxiety Max Last 24 Hours  10   Anxiety Min Last 24 Hours  10   Psychosocial & Spiritual Assessment    Palliative Care Outcomes    Patient/Family meeting held?  Yes   Palliative Care Outcomes  Clarified goals of care, Improved pain interventions, Changed to focus on comfort   Patient/Family wishes: Interventions discontinued/not started   Antibiotics      Additional Data Reviewed:  CBC:    Component Value Date/Time   WBC 9.8 04/05/2015 0528   HGB 11.4* 04/05/2015 0528   HCT 35.8* 04/05/2015 0528   PLT 255 04/05/2015 0528   MCV 93.0 04/05/2015 0528   NEUTROABS 5.6 04/05/2015 0528   LYMPHSABS 2.7 04/05/2015 0528   MONOABS 1.0 04/05/2015 0528   EOSABS 0.5 04/05/2015 0528   BASOSABS 0.0 04/05/2015 0528   Comprehensive Metabolic Panel:    Component Value Date/Time   NA 143 04/05/2015 0528   K 3.0* 04/05/2015 0528   CL 104 04/05/2015 0528   CO2 29 04/05/2015 0528   BUN 8 04/05/2015 0528   CREATININE 0.88 04/05/2015 0528   GLUCOSE 164* 04/05/2015 0528   CALCIUM 7.8* 04/05/2015 0528   AST 20 04/05/2015 0528   ALT 96* 04/05/2015 0528   ALKPHOS 73 04/05/2015 0528   BILITOT 0.5 04/05/2015 0528   PROT 6.0* 04/05/2015 0528   ALBUMIN 3.0* 04/05/2015 0528     Time In: 10:45  Time Out: 1245  Time Total: 120 min Greater than 50%  of this time was spent counseling and coordinating care related to the above assessment and plan.  Signed by: Roma Schanz, DO  04/05/2015, 12:38 PM  Please contact Palliative Medicine Team  phone at [phone] for questions and concerns.

## 2015-04-05 NOTE — Progress Notes (Addendum)
[facility] PROGRESS NOTE   Lori Long [id number] DOB: 28-May-1923 DOA: 04/04/2015 PCP: Marjorie Smolder, MD  HPI/Subjective: Seen with her granddaughter at bedside. Await palliative/hospice team recommendation.  Assessment/Plan: Principal Problem:   Hypokalemia Active Problems:   Hypertension   DM II (diabetes mellitus, type II), controlled (HCC)   COPD (chronic obstructive pulmonary disease) (HCC)   Dementia   Suppurative lymphadenitis   Carcinoma of head and neck   Pressure ulcer   Protein-calorie malnutrition, severe   Suppurative lymphadenitis Carcinoma of head and neck Start Unasyn 3 g every 6 hours. Poorly differentiated carcinoma was just diagnosed on 03/21/15. Discussed with the granddaughter at bedside she is a POA for healthcare, she is okay with palliative. Awaiting palliative consultation, PT/OT.  Failure to thrive and adult/severe protein calorie malnutrition Presented with somnolence and poor oral intake. Patient was recently diagnosed with poorly differentiated carcinoma of the head and neck. Denies any pain with chewing or swallowing but she is still not able to eat. Started IV fluids secondary to poor oral intake.  Hypokalemia and hypomagnesemia Potassium of 3.0 and magnesium of 1.4, both to be repleted parenterally. Continued to have hypokalemia, replete with oral supplements.  Hypertension Metoprolol and hydralazine IVP until able to resume oral medications. Monitor blood pressure closely.  DM II (diabetes mellitus, type II), controlled ([place]) CBG monitoring every 6 hours while nothing by mouth. CBG monitoring with regular insulin sliding scale before meals once tolerating fluid intake.  COPD (chronic obstructive pulmonary disease) (HCC) Stable. Bronchodilators as needed.  Dementia Per granddaughter, he has worsened since she is residing at the skilled nursing facility since last month and has attributed them more over the past  2-3 days. Provide supportive care.   Code Status: DNR Family Communication: Plan discussed with the patient. Disposition Plan: Remains inpatient Diet: Diet regular Room service appropriate?: Yes; Fluid consistency:: Thin  Consultants:  None  Procedures:  None  Antibiotics:  None (indicate start date, and stop date if known)   Objective: Filed Vitals:   04/05/15 0900 04/05/15 1100  BP:    Pulse:    Temp: 97.4 F (36.3 C) 97.2 F (36.2 C)  Resp:      Intake/Output Summary (Last 24 hours) at 04/05/15 1152 Last data filed at 04/04/15 2354  Gross per 24 hour  Intake    160 ml  Output   3025 ml  Net  -2865 ml   Filed Weights   04/02/2015 1552 03/19/2015 2011  Weight: 71.668 kg (158 lb) 68.176 kg (150 lb 4.8 oz)    Exam: General: Alert and awake, oriented x3, not in any acute distress. HEENT: anicteric sclera, pupils reactive to light and accommodation, EOMI CVS: S1-S2 clear, no murmur rubs or gallops Chest: clear to auscultation bilaterally, no wheezing, rales or rhonchi Abdomen: soft nontender, nondistended, normal bowel sounds, no organomegaly Extremities: no cyanosis, clubbing or edema noted bilaterally Neuro: Cranial nerves II-XII intact, no focal neurological deficits  Data Reviewed: Basic Metabolic Panel:  Recent Labs Lab 03/28/2015 1335 04/02/2015 2234 04/04/15 0528 04/05/15 0528  NA 138  --  143 143  K 2.8*  --  3.0* 3.0*  CL 98*  --  104 104  CO2 27  --  29 29  GLUCOSE 200*  --  154* 164*  BUN 8  --  7 8  CREATININE 0.89  --  0.92 0.88  CALCIUM 8.1*  --  7.4* 7.8*  MG  --  1.4*  --  1.7  PHOS  --  2.2*  --   --    Liver Function Tests:  Recent Labs Lab 03/29/2015 1335 04/04/15 0528 04/05/15 0528  AST 20 20 20   ALT 13* 12* 96*  ALKPHOS 69 57 73  BILITOT 0.6 0.5 0.5  PROT 6.4* 5.5* 6.0*  ALBUMIN 3.2* 2.7* 3.0*    Recent Labs Lab 03/21/2015 1335  LIPASE 24   No results for input(s): AMMONIA in the last 168 hours. CBC:  Recent  Labs Lab 03/27/2015 1335 04/04/15 0528 04/05/15 0528  WBC 8.4 8.1 9.8  NEUTROABS  --  3.6 5.6  HGB 11.6* 9.9* 11.4*  HCT 35.8* 30.7* 35.8*  MCV 91.3 91.9 93.0  PLT 269 241 255   Cardiac Enzymes:  Recent Labs Lab 03/22/2015 1557  TROPONINI <0.03   BNP (last 3 results) No results for input(s): BNP in the last 8760 hours.  ProBNP (last 3 results) No results for input(s): PROBNP in the last 8760 hours.  CBG:  Recent Labs Lab 04/04/15 0652 04/04/15 0752 04/04/15 1513 04/05/15 0036 04/05/15 0615  GLUCAP 141* 156* 189* 138* 169*    Micro Recent Results (from the past 240 hour(s))  Blood Culture (routine x 2)     Status: None (Preliminary result)   Collection Time: 04/04/2015  3:53 PM  Result Value Ref Range Status   Specimen Description BLOOD LEFT HAND  Final   Special Requests BOTTLES DRAWN AEROBIC ONLY 5CC  Final   Culture   Final    NO GROWTH 2 DAYS Performed at [facility]    Report Status PENDING  Incomplete  Urine culture     Status: None   Collection Time: 04/08/2015  4:55 PM  Result Value Ref Range Status   Specimen Description URINE, CATHETERIZED  Final   Special Requests NONE  Final   Culture   Final    NO GROWTH 1 DAY Performed at [facility]    Report Status 04/04/2015 FINAL  Final  Blood Culture (routine x 2)     Status: None (Preliminary result)   Collection Time: 04/04/2015 10:34 PM  Result Value Ref Range Status   Specimen Description BLOOD RIGHT HAND  Final   Special Requests IN PEDIATRIC BOTTLE 1 ML  Final   Culture   Final    NO GROWTH 1 DAY Performed at [facility]    Report Status PENDING  Incomplete     Studies: Dg Chest 2 View  03/29/2015  CLINICAL DATA:  Weakness EXAM: CHEST  2 VIEW COMPARISON:  12/24/2014 FINDINGS: Cardiomediastinal silhouette is stable. No acute infiltrate or pleural effusion. No pulmonary edema. Osteopenia and mild degenerative changes thoracic spine. Atherosclerotic calcifications of thoracic  aorta. IMPRESSION: No active disease. Osteopenia and mild degenerative changes thoracic spine. Electronically Signed   By: Lahoma [place] M.D.   On: 03/24/2015 15:51    Scheduled Meds: . feeding supplement (ENSURE ENLIVE)  237 mL Oral BID BM  .  HYDROmorphone (DILAUDID) injection  1 mg Intravenous Once  . sodium chloride flush  3 mL Intravenous Q12H   Continuous Infusions: . HYDROmorphone 0.5 mg/hr (04/05/15 1148)       Time spent: 35 minutes    [facility] A  [facility] Pager [phone] If 7PM-7AM, please contact night-coverage at [email or web address], password [facility] 04/05/2015, 11:52 AM

## 2015-04-08 LAB — CULTURE, BLOOD (ROUTINE X 2): CULTURE: NO GROWTH

## 2015-04-09 LAB — CULTURE, BLOOD (ROUTINE X 2): CULTURE: NO GROWTH

## 2015-04-09 NOTE — Progress Notes (Addendum)
Waste 57ml  Dilaudid IV in sink with Technical brewer.

## 2015-04-09 DEATH — deceased

## 2015-05-10 NOTE — Discharge Summary (Signed)
Death Summary  Lori Long [id number] DOB: 1923/06/16 DOA: 2015-04-19  PCP: Marjorie Smolder, MD  Admit date: 04/19/2015 Date of Death: 2015-05-01  Final Diagnoses:  Principal Problem:   Hypokalemia Active Problems:   Hypertension   DM II (diabetes mellitus, type II), controlled (HCC)   COPD (chronic obstructive pulmonary disease) (HCC)   Dementia   Suppurative lymphadenitis   Carcinoma of head and neck   Pressure ulcer   Protein-calorie malnutrition, severe    1. HEENT cancer  History of present illness:  Lori Long is a 82 y.o. female with below past medical history who was recently discharged from the hospital after spending 6 days being treated with IV antibiotics for submandibular abscess/suppurative lymphadenitis secondary to carcinoma of the head and neck and is coming to the hospital due to weakness, poor appetite, difficulty voiding, inability to take her medication and worsening mental status.  In the ER, patient was noticed to have about a liter of urine and a bladder scan so a Foley catheter was placed. Workup is significant for hypokalemia, hyperglycemia and mild anemia.  Hospital Course:  Patient admitted to the hospital, spoke with her granddaughter more than once, she did have difficulty with eating, I consulted palliative and hospice measures and team, and fortunately they saw her on 04/05/2015, patient is DO NOT RESUSCITATE, at night she passed away peacefully. Night coverage notified.   Time: 04-22-15, 2:20 AM  Signed:  Verlee Monte A  [facility] 2015-05-01, 5:20 PM

## 2017-02-10 IMAGING — CR DG ANKLE COMPLETE 3+V*L*
3 series · 3 of 3 positions shown · non-contrast
Comparison: None.

CLINICAL DATA: Fell 2 days ago with pain and bruising

EXAM:
LEFT ANKLE COMPLETE - 3+ VIEW

[t ankle joint ap left]
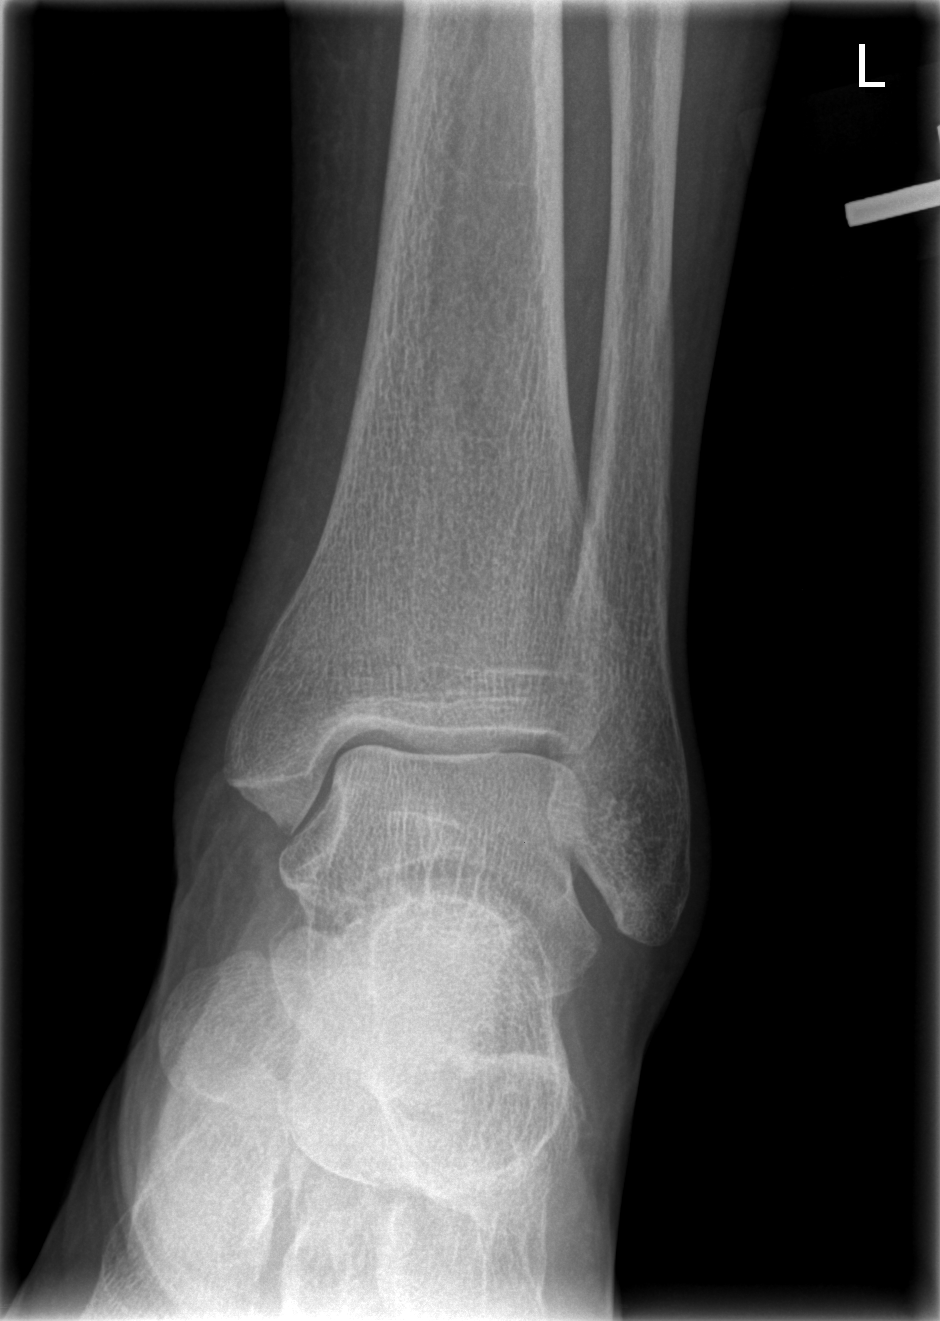

[t ankle joint oblique left]
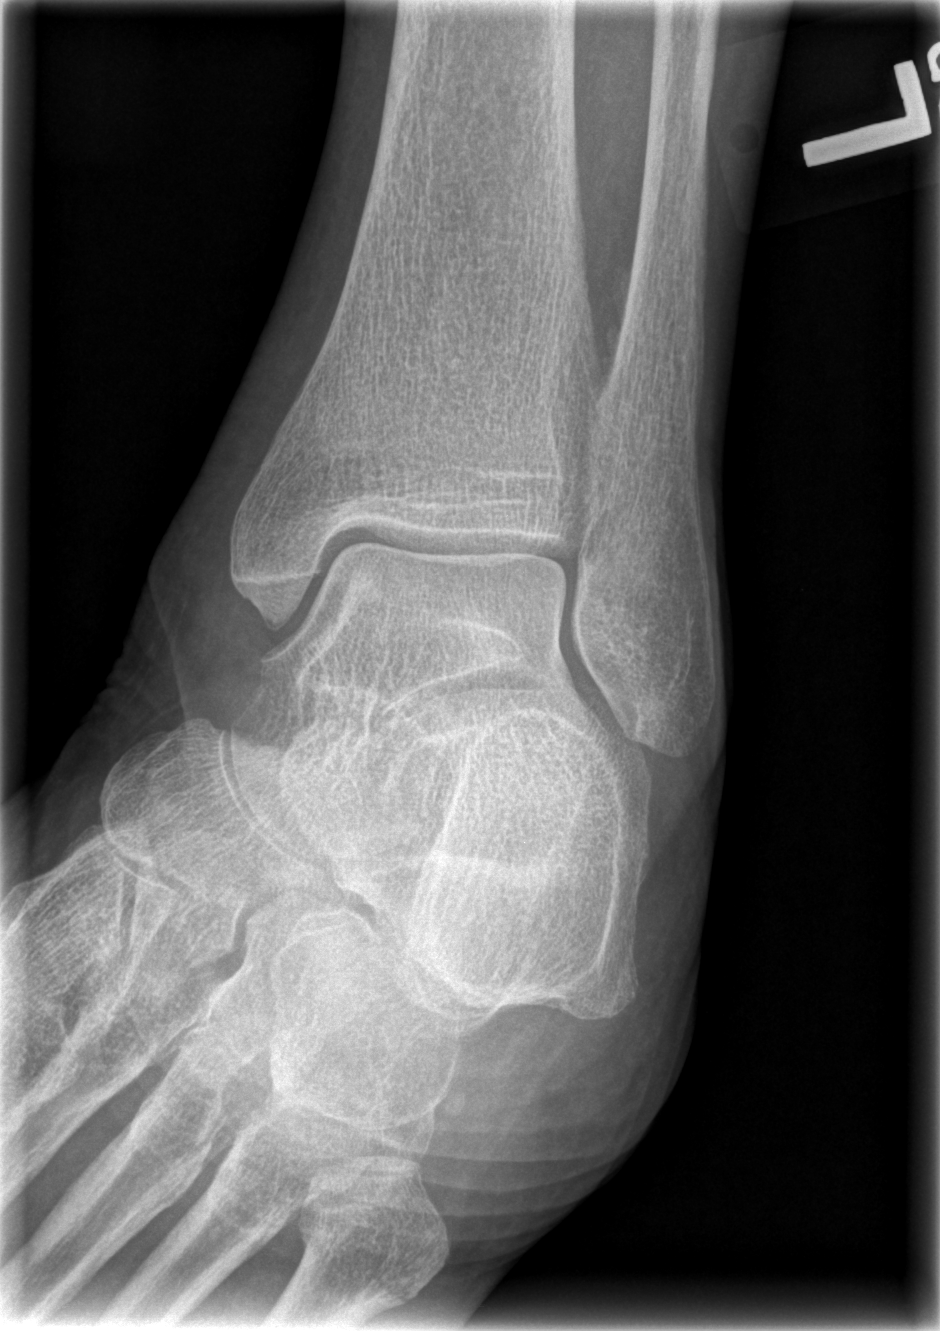

[t ankle joint lat left]
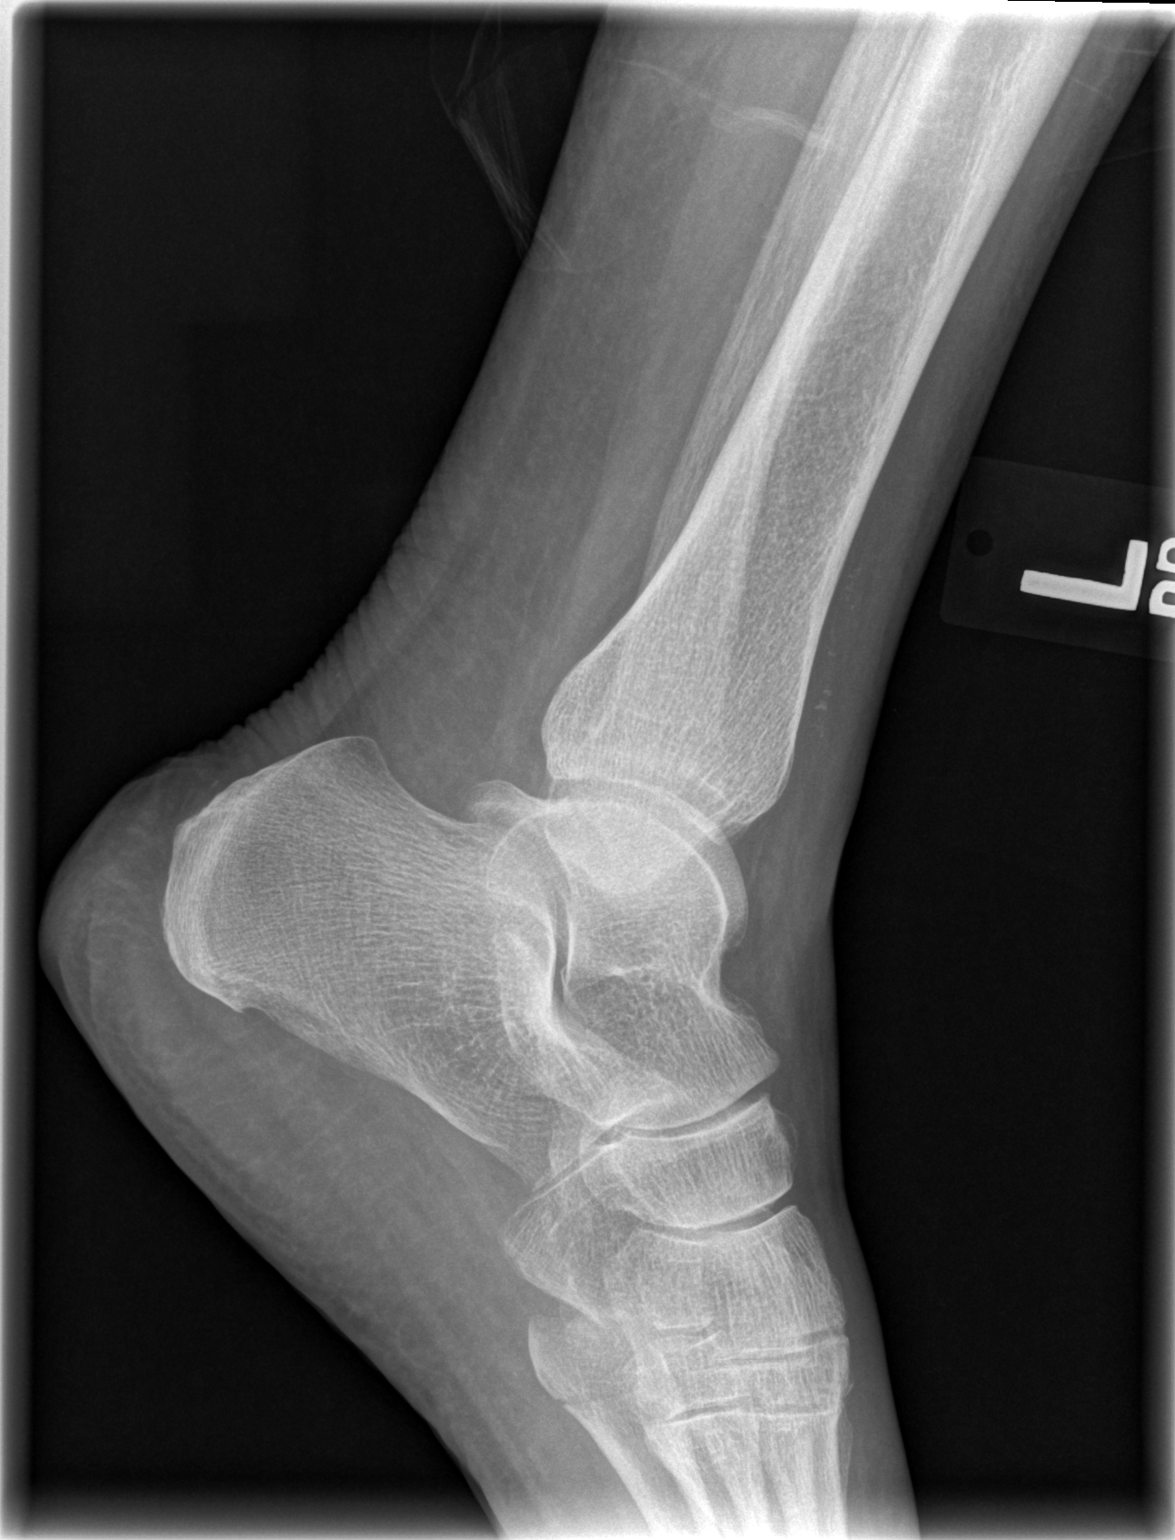

[3 of 3 positions shown; findings below may reference images not displayed]

FINDINGS: The ankle joint appears normal. Alignment is normal. No ankle
fracture is seen. However, there is a nondisplaced fracture through
the base of the left fifth metatarsal.
IMPRESSION: 1. Negative left ankle.
2. Nondisplaced fracture through the base of the left fifth
metatarsal.

## 2017-02-10 IMAGING — CR DG FOOT COMPLETE 3+V*L*
3 series · 3 of 3 positions shown · non-contrast
Comparison: None.

CLINICAL DATA: 89-year-old female with a history of fall 2 days
prior. Pain.

EXAM:
LEFT FOOT - COMPLETE 3+ VIEW

[t foot ap left]
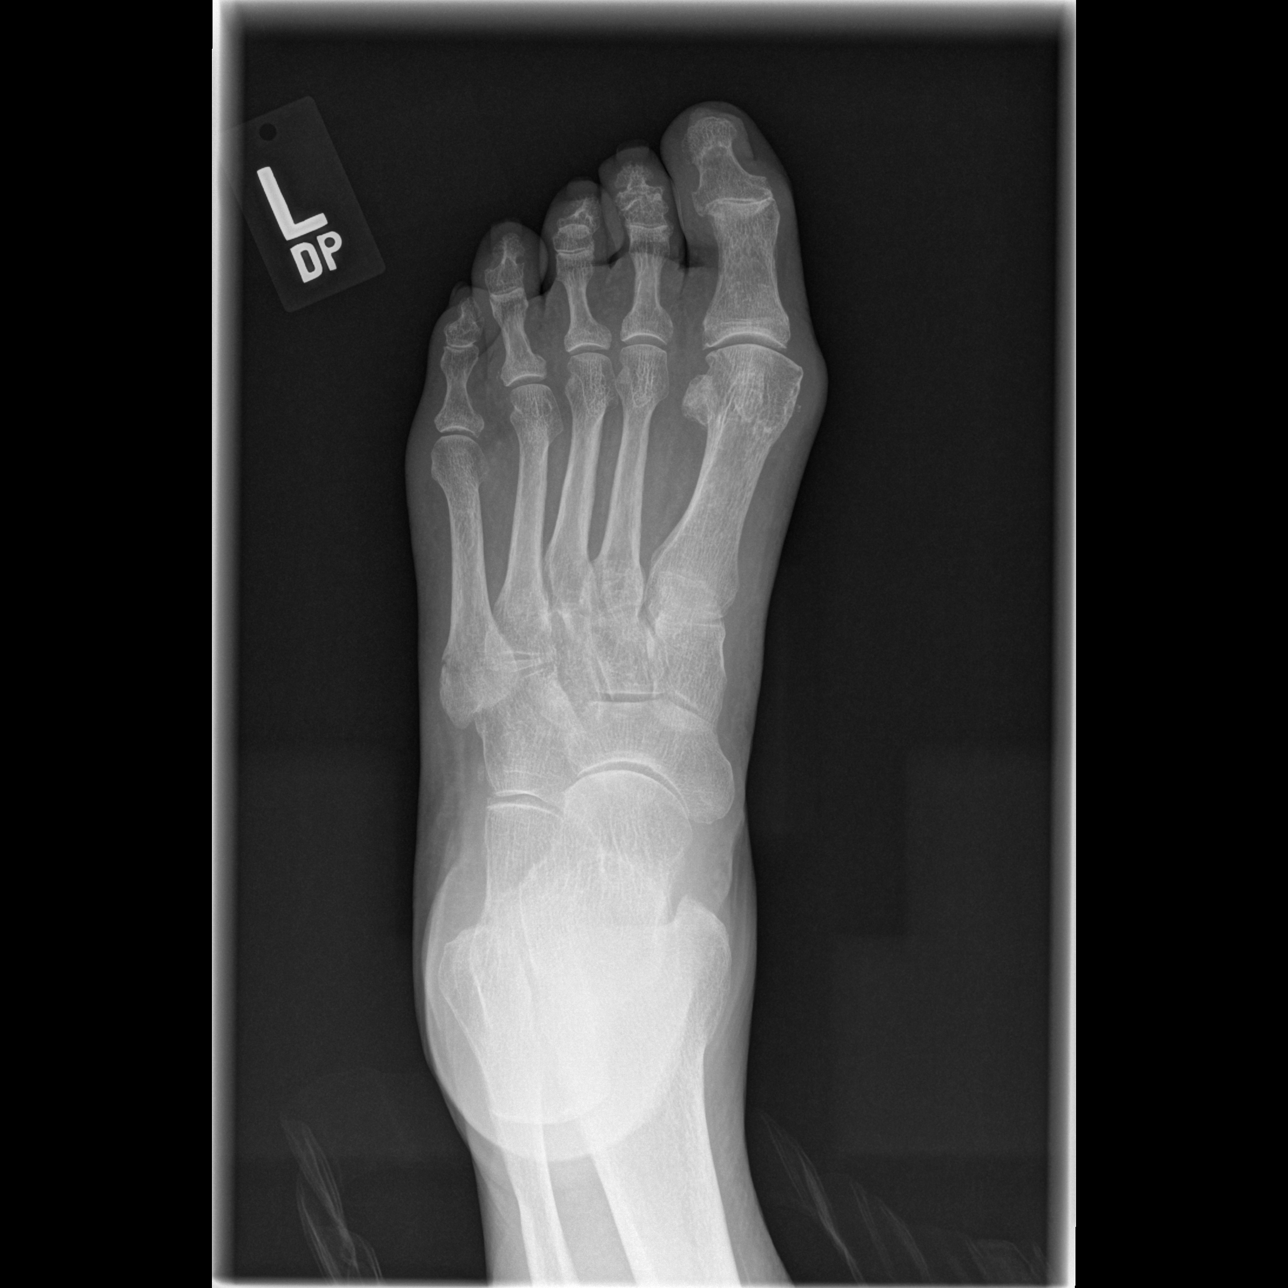

[t foot oblique left]
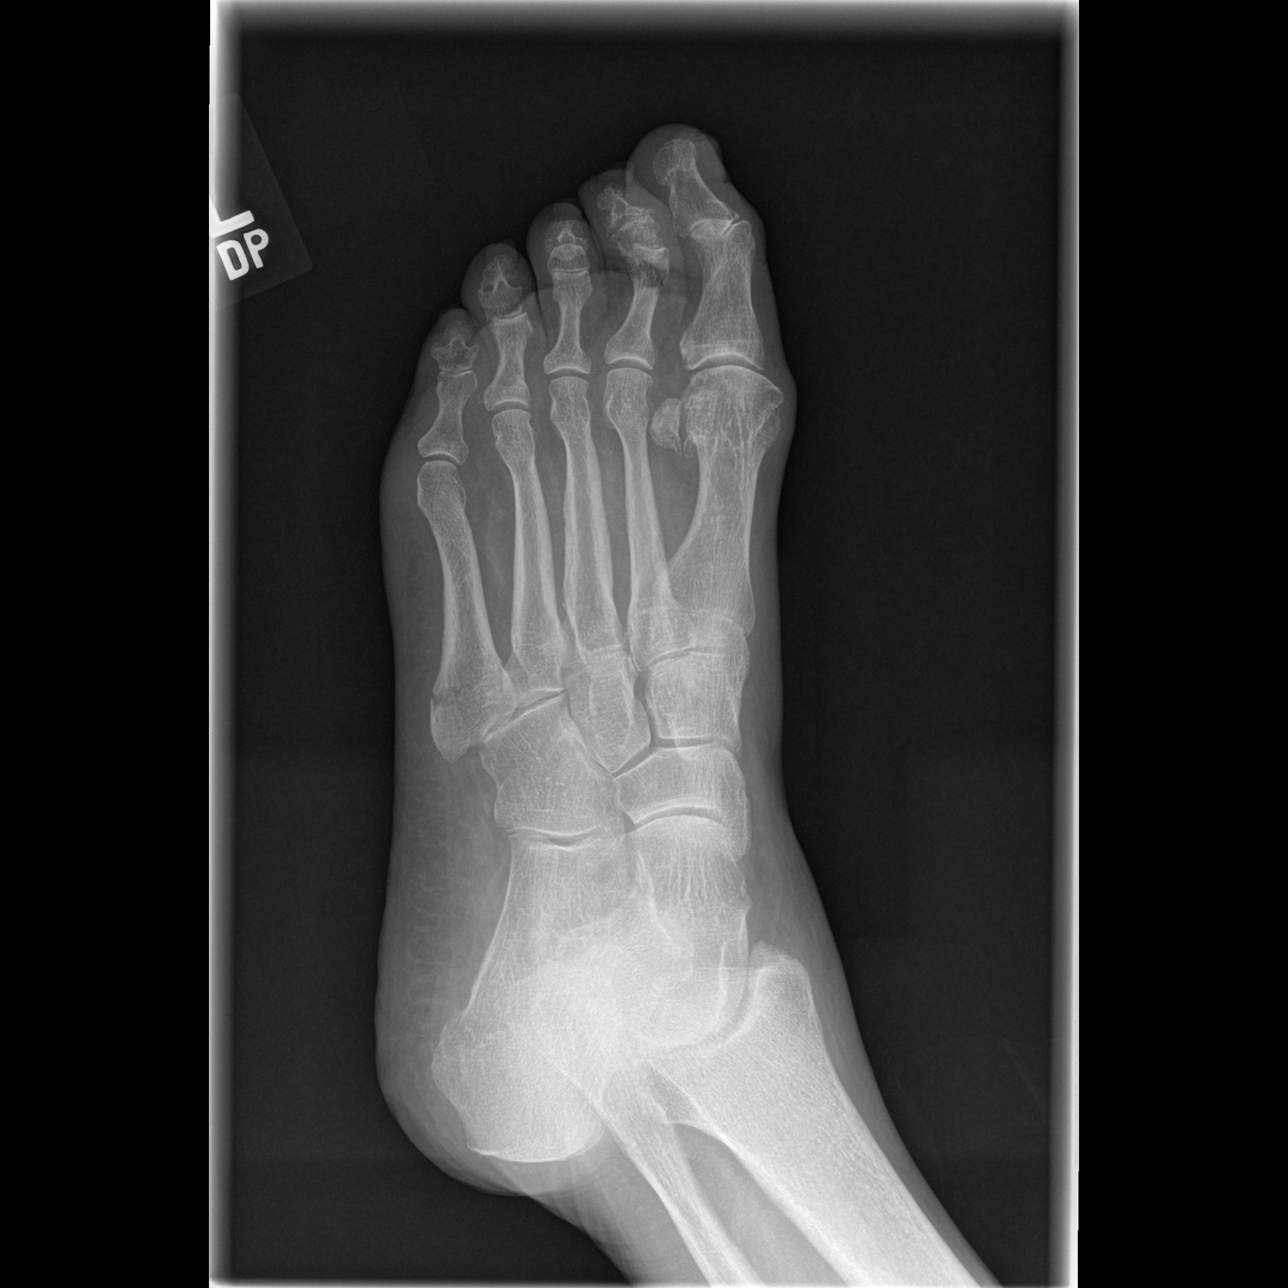

[t foot lat left]
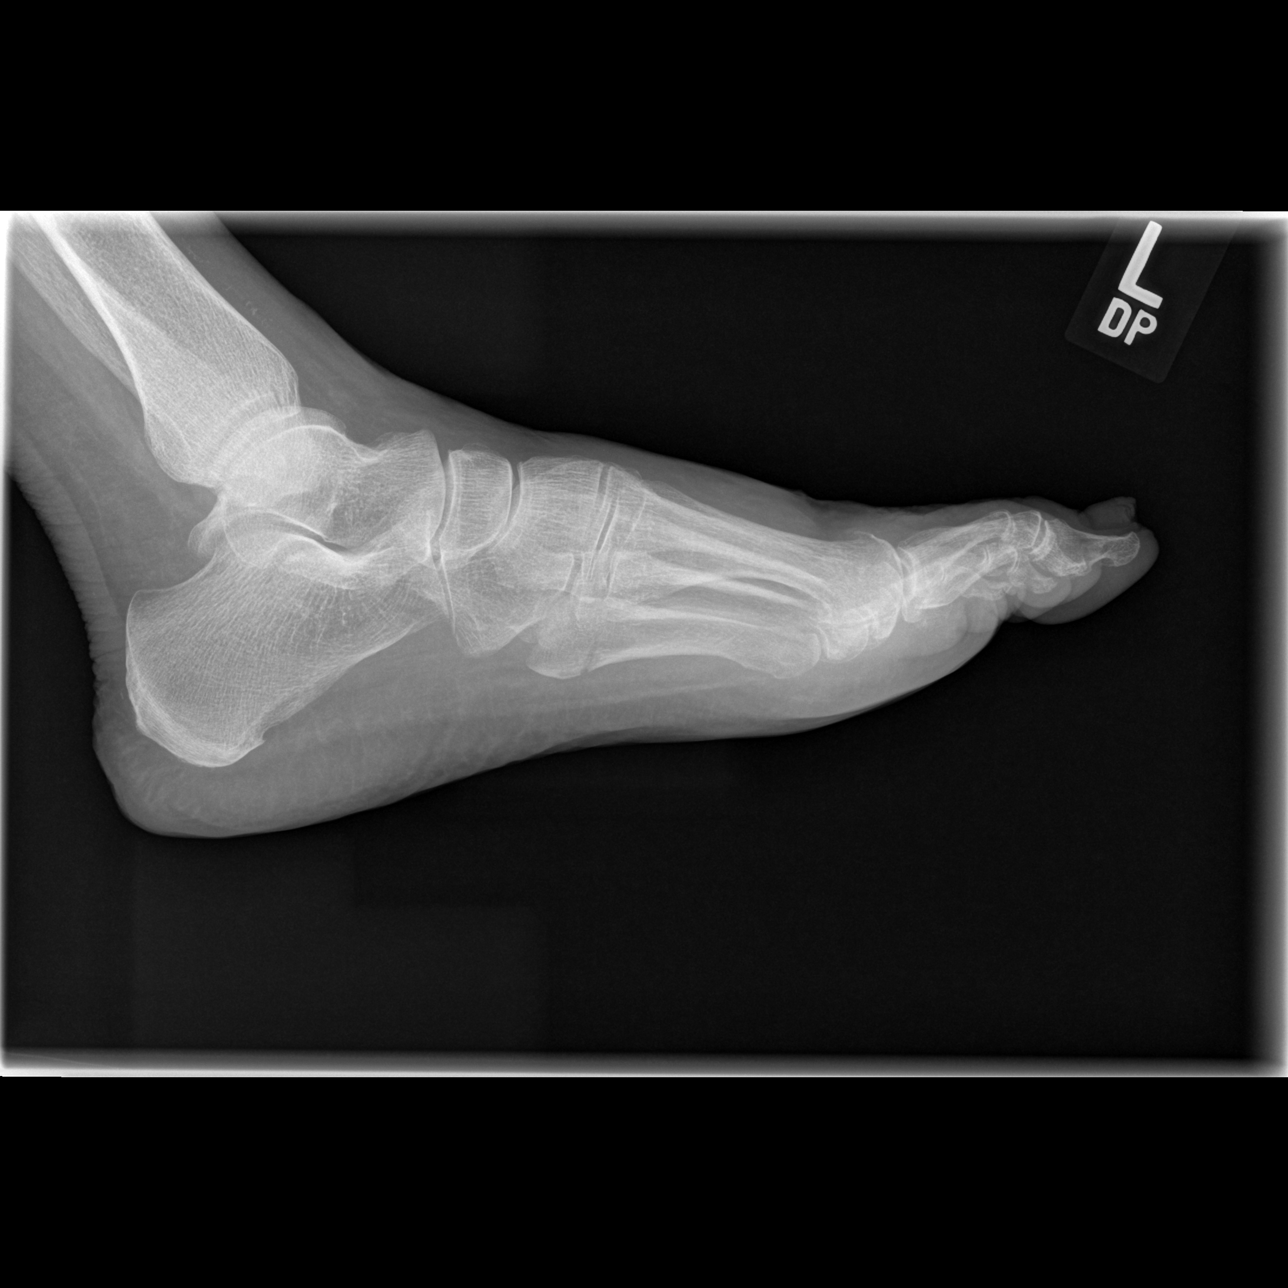

[3 of 3 positions shown; findings below may reference images not displayed]

FINDINGS: Acute fracture the base of the left fifth metatarsal. Associated
soft tissue swelling. No other acute bony met abnormality
identified.

Osteopenia.

Osteoarthritis of the interphalangeal joints and of the hindfoot.
IMPRESSION: Acute fracture the base of the left fifth metatarsal with associated
soft tissue swelling.
